# Patient Record
Sex: Female | Born: 1935
Health system: Southern US, Community
[De-identification: ages and names within clinical notes are randomized; demographics above are authoritative.]

## PROBLEM LIST (undated history)

## (undated) DIAGNOSIS — N189 Chronic kidney disease, unspecified: Secondary | ICD-10-CM

## (undated) DIAGNOSIS — I10 Essential (primary) hypertension: Secondary | ICD-10-CM

## (undated) DIAGNOSIS — K208 Other esophagitis without bleeding: Secondary | ICD-10-CM

## (undated) DIAGNOSIS — M549 Dorsalgia, unspecified: Secondary | ICD-10-CM

## (undated) DIAGNOSIS — Z860101 Personal history of adenomatous and serrated colon polyps: Secondary | ICD-10-CM

## (undated) DIAGNOSIS — K222 Esophageal obstruction: Secondary | ICD-10-CM

## (undated) DIAGNOSIS — M1990 Unspecified osteoarthritis, unspecified site: Secondary | ICD-10-CM

## (undated) DIAGNOSIS — E119 Type 2 diabetes mellitus without complications: Secondary | ICD-10-CM

## (undated) DIAGNOSIS — Z9289 Personal history of other medical treatment: Secondary | ICD-10-CM

## (undated) DIAGNOSIS — E041 Nontoxic single thyroid nodule: Secondary | ICD-10-CM

## (undated) DIAGNOSIS — Z972 Presence of dental prosthetic device (complete) (partial): Secondary | ICD-10-CM

## (undated) DIAGNOSIS — Z86010 Personal history of colonic polyps: Secondary | ICD-10-CM

## (undated) DIAGNOSIS — M069 Rheumatoid arthritis, unspecified: Secondary | ICD-10-CM

## (undated) DIAGNOSIS — R1310 Dysphagia, unspecified: Secondary | ICD-10-CM

## (undated) DIAGNOSIS — R0600 Dyspnea, unspecified: Secondary | ICD-10-CM

## (undated) DIAGNOSIS — R42 Dizziness and giddiness: Secondary | ICD-10-CM

## (undated) DIAGNOSIS — K219 Gastro-esophageal reflux disease without esophagitis: Secondary | ICD-10-CM

## (undated) HISTORY — PX: TONSILLECTOMY: SUR1361

## (undated) HISTORY — PX: COLONOSCOPY: SHX174

## (undated) HISTORY — PX: ABDOMINAL HYSTERECTOMY: SHX81

## (undated) HISTORY — PX: CHOLECYSTECTOMY: SHX55

## (undated) SURGERY — EGD (ESOPHAGOGASTRODUODENOSCOPY)
Anesthesia: Monitor Anesthesia Care

---

## 2005-06-20 ENCOUNTER — Emergency Department: Payer: Self-pay | Admitting: Emergency Medicine

## 2006-03-05 ENCOUNTER — Ambulatory Visit: Payer: Self-pay | Admitting: Internal Medicine

## 2007-05-20 ENCOUNTER — Ambulatory Visit: Payer: Self-pay | Admitting: Internal Medicine

## 2007-06-16 ENCOUNTER — Ambulatory Visit: Payer: Self-pay | Admitting: Internal Medicine

## 2007-07-16 ENCOUNTER — Ambulatory Visit: Payer: Self-pay | Admitting: Internal Medicine

## 2007-08-16 ENCOUNTER — Ambulatory Visit: Payer: Self-pay | Admitting: Internal Medicine

## 2007-09-15 ENCOUNTER — Ambulatory Visit: Payer: Self-pay | Admitting: Internal Medicine

## 2008-05-23 ENCOUNTER — Ambulatory Visit: Payer: Self-pay | Admitting: Internal Medicine

## 2009-05-24 ENCOUNTER — Ambulatory Visit: Payer: Self-pay | Admitting: Internal Medicine

## 2011-11-18 ENCOUNTER — Ambulatory Visit: Payer: Self-pay | Admitting: Internal Medicine

## 2012-11-23 ENCOUNTER — Ambulatory Visit: Payer: Self-pay

## 2013-07-06 ENCOUNTER — Ambulatory Visit: Payer: Self-pay | Admitting: Unknown Physician Specialty

## 2013-08-01 ENCOUNTER — Ambulatory Visit: Payer: Self-pay | Admitting: Unknown Physician Specialty

## 2013-09-19 ENCOUNTER — Ambulatory Visit: Payer: Self-pay | Admitting: Unknown Physician Specialty

## 2013-09-21 LAB — PATHOLOGY REPORT

## 2015-01-12 ENCOUNTER — Ambulatory Visit: Payer: Self-pay | Admitting: Unknown Physician Specialty

## 2015-09-11 ENCOUNTER — Other Ambulatory Visit: Payer: Self-pay | Admitting: Nurse Practitioner

## 2015-09-11 DIAGNOSIS — R634 Abnormal weight loss: Secondary | ICD-10-CM

## 2015-09-11 DIAGNOSIS — R1013 Epigastric pain: Secondary | ICD-10-CM

## 2015-09-17 ENCOUNTER — Ambulatory Visit
Admission: RE | Admit: 2015-09-17 | Discharge: 2015-09-17 | Disposition: A | Discharge status: Home / Self Care | Payer: Medicare Other | Source: Ambulatory Visit | Attending: Nurse Practitioner | Admitting: Nurse Practitioner

## 2015-09-17 DIAGNOSIS — M160 Bilateral primary osteoarthritis of hip: Secondary | ICD-10-CM | POA: Insufficient documentation

## 2015-09-17 DIAGNOSIS — M5136 Other intervertebral disc degeneration, lumbar region: Secondary | ICD-10-CM | POA: Insufficient documentation

## 2015-09-17 DIAGNOSIS — R161 Splenomegaly, not elsewhere classified: Secondary | ICD-10-CM | POA: Diagnosis not present

## 2015-09-17 DIAGNOSIS — M47816 Spondylosis without myelopathy or radiculopathy, lumbar region: Secondary | ICD-10-CM | POA: Insufficient documentation

## 2015-09-17 DIAGNOSIS — I2510 Atherosclerotic heart disease of native coronary artery without angina pectoris: Secondary | ICD-10-CM | POA: Diagnosis not present

## 2015-09-17 DIAGNOSIS — R634 Abnormal weight loss: Secondary | ICD-10-CM

## 2015-09-17 DIAGNOSIS — R1013 Epigastric pain: Secondary | ICD-10-CM | POA: Diagnosis present

## 2015-09-17 HISTORY — DX: Essential (primary) hypertension: I10

## 2015-09-17 HISTORY — DX: Type 2 diabetes mellitus without complications: E11.9

## 2015-09-17 MED ORDER — IOHEXOL 300 MG/ML  SOLN
75.0000 mL | Freq: Once | INTRAMUSCULAR | Status: AC | PRN
  Administered 2015-09-17: 75 mL via INTRAVENOUS

## 2015-10-02 ENCOUNTER — Encounter: Payer: Self-pay | Admitting: *Deleted

## 2015-10-03 ENCOUNTER — Encounter: Payer: Self-pay | Admitting: *Deleted

## 2015-10-03 ENCOUNTER — Ambulatory Visit
Admission: RE | Admit: 2015-10-03 | Discharge: 2015-10-03 | Disposition: A | Discharge status: Home / Self Care | Payer: Medicare Other | Source: Ambulatory Visit | Attending: Unknown Physician Specialty | Admitting: Unknown Physician Specialty

## 2015-10-03 ENCOUNTER — Ambulatory Visit: Payer: Medicare Other | Admitting: Anesthesiology

## 2015-10-03 ENCOUNTER — Encounter
Admission: RE | Disposition: A | Discharge status: Home / Self Care | Payer: Self-pay | Source: Ambulatory Visit | Attending: Unknown Physician Specialty

## 2015-10-03 DIAGNOSIS — E119 Type 2 diabetes mellitus without complications: Secondary | ICD-10-CM | POA: Diagnosis not present

## 2015-10-03 DIAGNOSIS — K210 Gastro-esophageal reflux disease with esophagitis: Secondary | ICD-10-CM | POA: Diagnosis not present

## 2015-10-03 DIAGNOSIS — Z79899 Other long term (current) drug therapy: Secondary | ICD-10-CM | POA: Diagnosis not present

## 2015-10-03 DIAGNOSIS — M1990 Unspecified osteoarthritis, unspecified site: Secondary | ICD-10-CM | POA: Diagnosis not present

## 2015-10-03 DIAGNOSIS — Z7984 Long term (current) use of oral hypoglycemic drugs: Secondary | ICD-10-CM | POA: Diagnosis not present

## 2015-10-03 DIAGNOSIS — R1310 Dysphagia, unspecified: Secondary | ICD-10-CM | POA: Insufficient documentation

## 2015-10-03 DIAGNOSIS — Z7982 Long term (current) use of aspirin: Secondary | ICD-10-CM | POA: Insufficient documentation

## 2015-10-03 DIAGNOSIS — I10 Essential (primary) hypertension: Secondary | ICD-10-CM | POA: Insufficient documentation

## 2015-10-03 DIAGNOSIS — K222 Esophageal obstruction: Secondary | ICD-10-CM | POA: Insufficient documentation

## 2015-10-03 DIAGNOSIS — R1013 Epigastric pain: Secondary | ICD-10-CM | POA: Diagnosis not present

## 2015-10-03 DIAGNOSIS — M069 Rheumatoid arthritis, unspecified: Secondary | ICD-10-CM | POA: Diagnosis not present

## 2015-10-03 HISTORY — PX: ESOPHAGOGASTRODUODENOSCOPY (EGD) WITH PROPOFOL: SHX5813

## 2015-10-03 HISTORY — DX: Nontoxic single thyroid nodule: E04.1

## 2015-10-03 HISTORY — DX: Rheumatoid arthritis, unspecified: M06.9

## 2015-10-03 HISTORY — DX: Gastro-esophageal reflux disease without esophagitis: K21.9

## 2015-10-03 HISTORY — DX: Esophageal obstruction: K22.2

## 2015-10-03 HISTORY — PX: SAVORY DILATION: SHX5439

## 2015-10-03 HISTORY — DX: Dysphagia, unspecified: R13.10

## 2015-10-03 HISTORY — DX: Unspecified osteoarthritis, unspecified site: M19.90

## 2015-10-03 LAB — GLUCOSE, CAPILLARY: GLUCOSE-CAPILLARY: 97 mg/dL (ref 65–99)

## 2015-10-03 SURGERY — ESOPHAGOGASTRODUODENOSCOPY (EGD) WITH PROPOFOL
Anesthesia: General

## 2015-10-03 MED ORDER — PROPOFOL 500 MG/50ML IV EMUL
INTRAVENOUS | Status: DC | PRN
  Administered 2015-10-03: 75 ug/kg/min via INTRAVENOUS

## 2015-10-03 MED ORDER — SODIUM CHLORIDE 0.9 % IV SOLN
INTRAVENOUS | Status: DC
  Administered 2015-10-03: 1000 mL via INTRAVENOUS
  Administered 2015-10-03: 11:00:00 via INTRAVENOUS

## 2015-10-03 MED ORDER — ESMOLOL HCL 100 MG/10ML IV SOLN: INTRAVENOUS | Status: DC | PRN

## 2015-10-03 MED ORDER — ESMOLOL HCL 100 MG/10ML IV SOLN
INTRAVENOUS | Status: DC | PRN
  Administered 2015-10-03: 20 mg via INTRAVENOUS
  Administered 2015-10-03: 30 mg via INTRAVENOUS
  Administered 2015-10-03: 20 mg via INTRAVENOUS
  Administered 2015-10-03: 30 mg via INTRAVENOUS

## 2015-10-03 MED ORDER — FENTANYL CITRATE (PF) 100 MCG/2ML IJ SOLN
INTRAMUSCULAR | Status: DC | PRN
Start: 2015-10-03 — End: 2015-10-03
  Administered 2015-10-03 (×2): 25 ug via INTRAVENOUS

## 2015-10-03 MED ORDER — LABETALOL HCL 5 MG/ML IV SOLN
INTRAVENOUS | Status: DC | PRN
  Administered 2015-10-03 (×2): 5 mg via INTRAVENOUS

## 2015-10-03 MED ORDER — PROPOFOL 10 MG/ML IV BOLUS
INTRAVENOUS | Status: DC | PRN
  Administered 2015-10-03: 20 mg via INTRAVENOUS
  Administered 2015-10-03: 30 mg via INTRAVENOUS

## 2015-10-03 MED ORDER — LIDOCAINE HCL (PF) 2 % IJ SOLN
INTRAMUSCULAR | Status: DC | PRN
Start: 2015-10-03 — End: 2015-10-03
  Administered 2015-10-03: 40 mg

## 2015-10-03 NOTE — Transfer of Care (Signed)
Immediate Anesthesia Transfer of Care Note  Patient: Lisa Hernandez  Procedure(s) Performed: Procedure(s): ESOPHAGOGASTRODUODENOSCOPY (EGD) WITH PROPOFOL (N/A) SAVORY DILATION (N/A)  Patient Location: PACU  Anesthesia Type:General  Level of Consciousness: sedated  Airway & Oxygen Therapy: Patient Spontanous Breathing and Patient connected to nasal cannula oxygen  Post-op Assessment: Report given to RN and Post -op Vital signs reviewed and stable  Post vital signs: Reviewed and stable  Last Vitals:  Filed Vitals:   10/03/15 0831  BP: 108/62  Temp: 36.8 C  Resp: 16    Complications: No apparent anesthesia complications

## 2015-10-03 NOTE — Op Note (Signed)
[facility] Gastroenterology Patient Name: Lisa Hernandez Procedure Date: 10/03/2015 10:48 AM MRN: [id number] Account #: [id number] Date of Birth: January 19, 1936 Admit Type: Outpatient Age: 79 Room: [facility] ENDO ROOM 4 Gender: Female Note Status: Finalized Procedure:         Upper GI endoscopy Indications:       Dysphagia Providers:         Manya Silvas, MD Referring MD:      Youlanda Roys. Ola Spurr, MD (Referring MD) Medicines:         Propofol per Anesthesia Complications:     No immediate complications. Procedure:         Pre-Anesthesia Assessment:                    - After reviewing the risks and benefits, the patient was                     deemed in satisfactory condition to undergo the procedure.                    After obtaining informed consent, the endoscope was passed                     under direct vision. Throughout the procedure, the                     patient's blood pressure, pulse, and oxygen saturations                     were monitored continuously. The Endoscope was introduced                     through the mouth, and advanced to the second part of                     duodenum. The upper GI endoscopy was accomplished without                     difficulty. The patient tolerated the procedure well. Findings:      A benign-appearing, intrinsic severe (stenosis; an endoscope cannot       pass) stenosis measuring 1 cm (in length) was found 38 cm from the       incisors and was traversed after dilation. There was streaks of       esophagitis A guidewire was placed and the scope was withdrawn. Dilation       was performed with a Savary dilator with mild resistance at 12.8 mm, 14       mm and 15 mm.      The stomach was normal.      The examined duodenum was normal. Impression:        - Benign-appearing esophageal stricture. Dilated.                    - Normal stomach.                    - Normal examined duodenum.                    - No specimens  collected. Recommendation:    - Repeat the upper endoscopy in 4 weeks for retreatment. Manya Silvas, MD 10/03/2015 11:11:12 AM This report has been signed electronically. Number of Addenda: 0 Note Initiated On: 10/03/2015 10:48 AM  [facility]

## 2015-10-03 NOTE — H&P (Signed)
   Primary Care Physician:  [name], MD Primary Gastroenterologist:  Dr. [name]  Pre-Procedure History & Physical: HPI:  Lisa Hernandez is a 79 y.o. female is here for an endoscopy.   Past Medical History  Diagnosis Date  . Diabetes mellitus without complication ([place])   . Hypertension   . GERD (gastroesophageal reflux disease)   . Esophageal stricture   . Osteoarthritis     knees  . Rheumatoid arthritis ([place])   . Thyroid nodule   . Dysphagia     Past Surgical History  Procedure Laterality Date  . Abdominal hysterectomy    . Cholecystectomy    . Tonsillectomy      Prior to Admission medications   Medication Sig Start Date End Date Taking? Authorizing Provider  aspirin EC 81 MG tablet Take 81 mg by mouth daily.   Yes Historical Provider, MD  lansoprazole (PREVACID SOLUTAB) 30 MG disintegrating tablet Take 30 mg by mouth 2 (two) times daily.    Yes Historical Provider, MD  lisinopril (PRINIVIL,ZESTRIL) 10 MG tablet Take 10 mg by mouth daily. Take 2 tabs daily   Yes Historical Provider, MD  meclizine (ANTIVERT) 12.5 MG tablet Take 12.5 mg by mouth 3 (three) times daily as needed for dizziness.   Yes Historical Provider, MD  metFORMIN (GLUCOPHAGE) 500 MG tablet Take 500 mg by mouth. Take 2 tabs in am, 1 tab in pm   Yes Historical Provider, MD  methotrexate (RHEUMATREX) 2.5 MG tablet Take 7.5 mg by mouth once a week.   Yes Historical Provider, MD  Jonetta Speak LANCETS 37C MISC 3 daily   Yes Historical Provider, MD    Allergies as of 09/13/2015  . (Not on File)    History reviewed. No pertinent family history.  Social History   Social History  . Marital Status: Divorced    Spouse Name: N/A  . Number of Children: N/A  . Years of Education: N/A   Occupational History  . Not on file.   Social History Main Topics  . Smoking status: Never Smoker   . Smokeless tobacco: Never Used  . Alcohol Use: No  . Drug Use: No  . Sexual Activity: Not on file   Other  Topics Concern  . Not on file   Social History Narrative    Review of Systems: See HPI, otherwise negative ROS  Physical Exam: BP 108/62 mmHg  Temp(Src) 98.3 F (36.8 C) (Oral)  Resp 16  Ht 5\' 1"  (1.549 m)  Wt 53.071 kg (117 lb)  BMI 22.12 kg/m2  SpO2 100% General:   Alert,  pleasant and cooperative in NAD Head:  Normocephalic and atraumatic. Neck:  Supple; no masses or thyromegaly. Lungs:  Clear throughout to auscultation.    Heart:  Regular rate and rhythm. Abdomen:  Soft, nontender and nondistended. Normal bowel sounds, without guarding, and without rebound.   Neurologic:  Alert and  oriented x4;  grossly normal neurologically.  Impression/Plan: Lisa Hernandez is here for an endoscopy to be performed for dysphagia and epigastric pain  Risks, benefits, limitations, and alternatives regarding  endoscopy have been reviewed with the patient.  Questions have been answered.  All parties agreeable.   Gaylyn Cheers, MD  10/03/2015, 10:46 AM

## 2015-10-03 NOTE — Anesthesia Postprocedure Evaluation (Signed)
  Anesthesia Post-op Note  Patient: Lisa Hernandez  Procedure(s) Performed: Procedure(s): ESOPHAGOGASTRODUODENOSCOPY (EGD) WITH PROPOFOL (N/A) SAVORY DILATION (N/A)  Anesthesia type:General  Patient location: PACU  Post pain: Pain level controlled  Post assessment: Post-op Vital signs reviewed, Patient's Cardiovascular Status Stable, Respiratory Function Stable, Patent Airway and No signs of Nausea or vomiting  Post vital signs: Reviewed and stable  Last Vitals:  Filed Vitals:   10/03/15 1145  BP: 138/70  Pulse: 82  Temp:   Resp: 20    Level of consciousness: awake, alert  and patient cooperative  Complications: No apparent anesthesia complications

## 2015-10-03 NOTE — Anesthesia Preprocedure Evaluation (Addendum)
Anesthesia Evaluation  Patient identified by MRN, date of birth, ID band Patient awake    Reviewed: Allergy & Precautions, NPO status , Patient's Chart, lab work & pertinent test results, reviewed documented beta blocker date and time   Airway Mallampati: II  TM Distance: >3 FB     Dental  (+) Lower Dentures, Upper Dentures   Pulmonary           Cardiovascular hypertension, Pt. on medications      Neuro/Psych    GI/Hepatic GERD  ,  Endo/Other  diabetes  Renal/GU      Musculoskeletal  (+) Arthritis ,   Abdominal   Peds  Hematology   Anesthesia Other Findings   Reproductive/Obstetrics                            Anesthesia Physical Anesthesia Plan  ASA: II  Anesthesia Plan: General   Post-op Pain Management:    Induction:   Airway Management Planned: Nasal Cannula  Additional Equipment:   Intra-op Plan:   Post-operative Plan:   Informed Consent: I have reviewed the patients History and Physical, chart, labs and discussed the procedure including the risks, benefits and alternatives for the proposed anesthesia with the patient or authorized representative who has indicated his/her understanding and acceptance.     Plan Discussed with: CRNA  Anesthesia Plan Comments:         Anesthesia Quick Evaluation

## 2015-10-04 ENCOUNTER — Encounter: Payer: Self-pay | Admitting: Unknown Physician Specialty

## 2015-10-25 ENCOUNTER — Ambulatory Visit
Admission: RE | Admit: 2015-10-25 | Discharge: 2015-10-25 | Disposition: A | Discharge status: Home / Self Care | Payer: Medicare Other | Source: Ambulatory Visit | Attending: Unknown Physician Specialty | Admitting: Unknown Physician Specialty

## 2015-10-25 ENCOUNTER — Ambulatory Visit: Payer: Medicare Other | Admitting: Anesthesiology

## 2015-10-25 ENCOUNTER — Encounter: Payer: Self-pay | Admitting: Anesthesiology

## 2015-10-25 ENCOUNTER — Encounter
Admission: RE | Disposition: A | Discharge status: Home / Self Care | Payer: Self-pay | Source: Ambulatory Visit | Attending: Unknown Physician Specialty

## 2015-10-25 DIAGNOSIS — Z9049 Acquired absence of other specified parts of digestive tract: Secondary | ICD-10-CM | POA: Diagnosis not present

## 2015-10-25 DIAGNOSIS — E119 Type 2 diabetes mellitus without complications: Secondary | ICD-10-CM | POA: Insufficient documentation

## 2015-10-25 DIAGNOSIS — R1310 Dysphagia, unspecified: Secondary | ICD-10-CM | POA: Diagnosis present

## 2015-10-25 DIAGNOSIS — Z90710 Acquired absence of both cervix and uterus: Secondary | ICD-10-CM | POA: Diagnosis not present

## 2015-10-25 DIAGNOSIS — I10 Essential (primary) hypertension: Secondary | ICD-10-CM | POA: Diagnosis not present

## 2015-10-25 DIAGNOSIS — Z7984 Long term (current) use of oral hypoglycemic drugs: Secondary | ICD-10-CM | POA: Diagnosis not present

## 2015-10-25 DIAGNOSIS — Z881 Allergy status to other antibiotic agents status: Secondary | ICD-10-CM | POA: Diagnosis not present

## 2015-10-25 DIAGNOSIS — K219 Gastro-esophageal reflux disease without esophagitis: Secondary | ICD-10-CM | POA: Insufficient documentation

## 2015-10-25 DIAGNOSIS — M19071 Primary osteoarthritis, right ankle and foot: Secondary | ICD-10-CM | POA: Diagnosis not present

## 2015-10-25 DIAGNOSIS — Z79899 Other long term (current) drug therapy: Secondary | ICD-10-CM | POA: Insufficient documentation

## 2015-10-25 DIAGNOSIS — M069 Rheumatoid arthritis, unspecified: Secondary | ICD-10-CM | POA: Diagnosis not present

## 2015-10-25 DIAGNOSIS — Z7982 Long term (current) use of aspirin: Secondary | ICD-10-CM | POA: Diagnosis not present

## 2015-10-25 DIAGNOSIS — K222 Esophageal obstruction: Secondary | ICD-10-CM | POA: Insufficient documentation

## 2015-10-25 DIAGNOSIS — M19072 Primary osteoarthritis, left ankle and foot: Secondary | ICD-10-CM | POA: Insufficient documentation

## 2015-10-25 DIAGNOSIS — E041 Nontoxic single thyroid nodule: Secondary | ICD-10-CM | POA: Insufficient documentation

## 2015-10-25 HISTORY — PX: SAVORY DILATION: SHX5439

## 2015-10-25 HISTORY — PX: ESOPHAGOGASTRODUODENOSCOPY (EGD) WITH PROPOFOL: SHX5813

## 2015-10-25 LAB — GLUCOSE, CAPILLARY: Glucose-Capillary: 86 mg/dL (ref 65–99)

## 2015-10-25 SURGERY — ESOPHAGOGASTRODUODENOSCOPY (EGD) WITH PROPOFOL
Anesthesia: General

## 2015-10-25 MED ORDER — MIDAZOLAM HCL 2 MG/2ML IJ SOLN
INTRAMUSCULAR | Status: DC | PRN
  Administered 2015-10-25: 1 mg via INTRAVENOUS

## 2015-10-25 MED ORDER — SODIUM CHLORIDE 0.9 % IV SOLN: INTRAVENOUS | Status: DC

## 2015-10-25 MED ORDER — PROPOFOL 500 MG/50ML IV EMUL
INTRAVENOUS | Status: DC | PRN
  Administered 2015-10-25: 120 ug/kg/min via INTRAVENOUS

## 2015-10-25 MED ORDER — SODIUM CHLORIDE 0.9 % IV SOLN
INTRAVENOUS | Status: DC
  Administered 2015-10-25: 1000 mL via INTRAVENOUS

## 2015-10-25 MED ORDER — GLYCOPYRROLATE 0.2 MG/ML IJ SOLN
INTRAMUSCULAR | Status: DC | PRN
  Administered 2015-10-25: 0.1 mg via INTRAVENOUS

## 2015-10-25 MED ORDER — LIDOCAINE HCL (CARDIAC) 20 MG/ML IV SOLN
INTRAVENOUS | Status: DC | PRN
  Administered 2015-10-25: 40 mg via INTRAVENOUS

## 2015-10-25 MED ORDER — FENTANYL CITRATE (PF) 100 MCG/2ML IJ SOLN
INTRAMUSCULAR | Status: DC | PRN
  Administered 2015-10-25: 50 ug via INTRAVENOUS

## 2015-10-25 NOTE — Anesthesia Postprocedure Evaluation (Signed)
  Anesthesia Post-op Note  Patient: Lisa Hernandez  Procedure(s) Performed: Procedure(s) with comments: ESOPHAGOGASTRODUODENOSCOPY (EGD) WITH PROPOFOL (N/A) -  IDDM SAVORY DILATION (N/A)  Anesthesia type:General  Patient location: PACU  Post pain: Pain level controlled  Post assessment: Post-op Vital signs reviewed, Patient's Cardiovascular Status Stable, Respiratory Function Stable, Patent Airway and No signs of Nausea or vomiting  Post vital signs: Reviewed and stable  Last Vitals:  Filed Vitals:   10/25/15 1141  BP: 169/96  Pulse: 107  Temp:   Resp: 19    Level of consciousness: awake, alert  and patient cooperative  Complications: No apparent anesthesia complications

## 2015-10-25 NOTE — Transfer of Care (Signed)
Immediate Anesthesia Transfer of Care Note  Patient: Lisa Hernandez  Procedure(s) Performed: Procedure(s) with comments: ESOPHAGOGASTRODUODENOSCOPY (EGD) WITH PROPOFOL (N/A) -  IDDM SAVORY DILATION (N/A)  Patient Location: PACU  Anesthesia Type:General  Level of Consciousness: awake and sedated  Airway & Oxygen Therapy: Patient Spontanous Breathing and Patient connected to nasal cannula oxygen  Post-op Assessment: Report given to RN and Post -op Vital signs reviewed and stable  Post vital signs: Reviewed and stable  Last Vitals:  Filed Vitals:   10/25/15 1110  BP: 126/64  Pulse: 110  Temp:   Resp: 18    Complications: No apparent anesthesia complications

## 2015-10-25 NOTE — Anesthesia Procedure Notes (Signed)
Performed by: Vaughan Sine Pre-anesthesia Checklist: Patient identified, Emergency Drugs available, Suction available, Timeout performed and Patient being monitored Patient Re-evaluated:Patient Re-evaluated prior to inductionOxygen Delivery Method: Nasal cannula Preoxygenation: Pre-oxygenation with 100% oxygen Intubation Type: IV induction Ventilation: Nasal airway inserted- appropriate to patient size Airway Equipment and Method: Bite block Placement Confirmation: positive ETCO2 and CO2 detector

## 2015-10-25 NOTE — Anesthesia Preprocedure Evaluation (Addendum)
Anesthesia Evaluation  Patient identified by MRN, date of birth, ID band Patient awake    Reviewed: Allergy & Precautions, NPO status , Patient's Chart, lab work & pertinent test results, reviewed documented beta blocker date and time   Airway Mallampati: II  TM Distance: >3 FB     Dental  (+) Lower Dentures, Upper Dentures   Pulmonary neg pulmonary ROS,    Pulmonary exam normal breath sounds clear to auscultation       Cardiovascular hypertension, Pt. on medications Normal cardiovascular exam     Neuro/Psych negative neurological ROS  negative psych ROS   GI/Hepatic Neg liver ROS, GERD  Medicated and Controlled,  Endo/Other  diabetes, Well Controlled, Type 2, Oral Hypoglycemic Agents  Renal/GU negative Renal ROS  negative genitourinary   Musculoskeletal  (+) Arthritis , Osteoarthritis,    Abdominal Normal abdominal exam  (+)   Peds negative pediatric ROS (+)  Hematology negative hematology ROS (+)   Anesthesia Other Findings   Reproductive/Obstetrics                            Anesthesia Physical  Anesthesia Plan  ASA: II  Anesthesia Plan: General   Post-op Pain Management:    Induction:   Airway Management Planned: Nasal Cannula  Additional Equipment:   Intra-op Plan:   Post-operative Plan:   Informed Consent: I have reviewed the patients History and Physical, chart, labs and discussed the procedure including the risks, benefits and alternatives for the proposed anesthesia with the patient or authorized representative who has indicated his/her understanding and acceptance.     Plan Discussed with: CRNA  Anesthesia Plan Comments:         Anesthesia Quick Evaluation

## 2015-10-25 NOTE — H&P (Signed)
Primary Care Physician:  [name], MD Primary Gastroenterologist:  Dr. [name]  Pre-Procedure History & Physical: HPI:  Lisa Hernandez is a 79 y.o. female is here for an endoscopy.   Past Medical History  Diagnosis Date  . Diabetes mellitus without complication ([place])   . Hypertension   . GERD (gastroesophageal reflux disease)   . Esophageal stricture   . Osteoarthritis     knees  . Rheumatoid arthritis ([place])   . Thyroid nodule   . Dysphagia     Past Surgical History  Procedure Laterality Date  . Abdominal hysterectomy    . Cholecystectomy    . Tonsillectomy    . Esophagogastroduodenoscopy (egd) with propofol N/A 10/03/2015    Procedure: ESOPHAGOGASTRODUODENOSCOPY (EGD) WITH PROPOFOL;  Surgeon: Manya Silvas, MD;  Location: [place];  Service: Endoscopy;  Laterality: N/A;  . Savory dilation N/A 10/03/2015    Procedure: SAVORY DILATION;  Surgeon: Manya Silvas, MD;  Location: [facility] ENDOSCOPY;  Service: Endoscopy;  Laterality: N/A;    Prior to Admission medications   Medication Sig Start Date End Date Taking? Authorizing Provider  aspirin EC 81 MG tablet Take 81 mg by mouth daily.   Yes Historical Provider, MD  lansoprazole (PREVACID SOLUTAB) 30 MG disintegrating tablet Take 30 mg by mouth 2 (two) times daily.    Yes Historical Provider, MD  lisinopril (PRINIVIL,ZESTRIL) 10 MG tablet Take 10 mg by mouth daily. Take 2 tabs daily   Yes Historical Provider, MD  meclizine (ANTIVERT) 12.5 MG tablet Take 12.5 mg by mouth 3 (three) times daily as needed for dizziness.   Yes Historical Provider, MD  metFORMIN (GLUCOPHAGE) 500 MG tablet Take 500 mg by mouth. Take 2 tabs in am, 1 tab in pm   Yes Historical Provider, MD  methotrexate (RHEUMATREX) 2.5 MG tablet Take 7.5 mg by mouth once a week.   Yes Historical Provider, MD  Jonetta Speak LANCETS [id number] [place] 3 daily   Yes Historical Provider, MD    Allergies as of 10/05/2015 - Review Complete 10/03/2015  Allergen  Reaction Noted  . Erythromycin ethylsuccinate [erythromycin] Other (See Comments) 10/02/2015    History reviewed. No pertinent family history.  Social History   Social History  . Marital Status: Divorced    Spouse Name: N/A  . Number of Children: N/A  . Years of Education: N/A   Occupational History  . Not on file.   Social History Main Topics  . Smoking status: Never Smoker   . Smokeless tobacco: Never Used  . Alcohol Use: No  . Drug Use: No  . Sexual Activity: Not on file   Other Topics Concern  . Not on file   Social History Narrative    Review of Systems: See HPI, otherwise negative ROS  Physical Exam: BP 134/64 mmHg  Pulse 78  Temp(Src) 97.5 F (36.4 C) (Tympanic)  Resp 16  Ht 5\' 1"  (1.549 m)  Wt 53.978 kg (119 lb)  BMI 22.50 kg/m2  SpO2 100% General:   Alert,  pleasant and cooperative in NAD Head:  Normocephalic and atraumatic. Neck:  Supple; no masses or thyromegaly. Lungs:  Clear throughout to auscultation.    Heart:  Regular rate and rhythm. Abdomen:  Soft, nontender and nondistended. Normal bowel sounds, without guarding, and without rebound.   Neurologic:  Alert and  oriented x4;  grossly normal neurologically.  Impression/Plan: [name] is here for an endoscopy to be performed for dysphagia, previous strictures  Risks, benefits, limitations, and  alternatives regarding  endoscopy have been reviewed with the patient.  Questions have been answered.  All parties agreeable.   Gaylyn Cheers, MD  10/25/2015, 10:48 AM

## 2015-10-25 NOTE — Op Note (Signed)
[facility] Gastroenterology Patient Name: Lisa Hernandez Procedure Date: 10/25/2015 10:47 AM MRN: [id number] Account #: [id number] Date of Birth: 07-30-1936 Admit Type: Outpatient Age: 79 Room: [facility] ENDO ROOM 1 Gender: Female Note Status: Finalized Procedure:         Upper GI endoscopy Indications:       Dysphagia Providers:         Manya Silvas, MD Medicines:         Propofol per Anesthesia Complications:     No immediate complications. Procedure:         Pre-Anesthesia Assessment:                    - After reviewing the risks and benefits, the patient was                     deemed in satisfactory condition to undergo the procedure.                    After obtaining informed consent, the endoscope was passed                     under direct vision. Throughout the procedure, the                     patient's blood pressure, pulse, and oxygen saturations                     were monitored continuously. The Olympus GIF-140 endoscope                     (S#: [id number]) was introduced through the mouth, and                     advanced to the second part of duodenum. The upper GI                     endoscopy was accomplished without difficulty. The patient                     tolerated the procedure well. Findings:      One severe benign-appearing, intrinsic stenosis was found. The stricture       was seen and was not traversed. A guidewire was placed and the scope was       withdrawn. Dilation was performed with a Savary dilator with mild       resistance at 15 mm and 16 mm. Impression:        - Benign-appearing esophageal stenosis. Dilated.                    - No specimens collected. Recommendation:    - soft food for 3 days, eat slowly, chew well, take small                     bites Manya Silvas, MD 10/25/2015 11:09:00 AM This report has been signed electronically. Number of Addenda: 0 Note Initiated On: 10/25/2015 10:47 AM      [facility]

## 2015-10-29 ENCOUNTER — Encounter: Payer: Self-pay | Admitting: Unknown Physician Specialty

## 2016-08-25 HISTORY — PX: OTHER SURGICAL HISTORY: SHX169

## 2016-10-24 ENCOUNTER — Encounter: Payer: Self-pay | Admitting: *Deleted

## 2016-10-27 ENCOUNTER — Encounter
Admission: RE | Disposition: A | Discharge status: Home / Self Care | Payer: Self-pay | Source: Ambulatory Visit | Attending: Unknown Physician Specialty

## 2016-10-27 ENCOUNTER — Ambulatory Visit: Payer: Medicare Other | Admitting: Anesthesiology

## 2016-10-27 ENCOUNTER — Ambulatory Visit
Admission: RE | Admit: 2016-10-27 | Discharge: 2016-10-27 | Disposition: A | Discharge status: Home / Self Care | Payer: Medicare Other | Source: Ambulatory Visit | Attending: Unknown Physician Specialty | Admitting: Unknown Physician Specialty

## 2016-10-27 ENCOUNTER — Encounter: Payer: Self-pay | Admitting: Anesthesiology

## 2016-10-27 DIAGNOSIS — K222 Esophageal obstruction: Secondary | ICD-10-CM | POA: Diagnosis not present

## 2016-10-27 DIAGNOSIS — E119 Type 2 diabetes mellitus without complications: Secondary | ICD-10-CM | POA: Diagnosis not present

## 2016-10-27 DIAGNOSIS — K219 Gastro-esophageal reflux disease without esophagitis: Secondary | ICD-10-CM | POA: Insufficient documentation

## 2016-10-27 DIAGNOSIS — M069 Rheumatoid arthritis, unspecified: Secondary | ICD-10-CM | POA: Diagnosis not present

## 2016-10-27 DIAGNOSIS — I10 Essential (primary) hypertension: Secondary | ICD-10-CM | POA: Insufficient documentation

## 2016-10-27 DIAGNOSIS — Z7982 Long term (current) use of aspirin: Secondary | ICD-10-CM | POA: Insufficient documentation

## 2016-10-27 DIAGNOSIS — R1310 Dysphagia, unspecified: Secondary | ICD-10-CM | POA: Diagnosis present

## 2016-10-27 DIAGNOSIS — Z79899 Other long term (current) drug therapy: Secondary | ICD-10-CM | POA: Insufficient documentation

## 2016-10-27 DIAGNOSIS — Z7984 Long term (current) use of oral hypoglycemic drugs: Secondary | ICD-10-CM | POA: Insufficient documentation

## 2016-10-27 HISTORY — PX: ESOPHAGOGASTRODUODENOSCOPY (EGD) WITH PROPOFOL: SHX5813

## 2016-10-27 LAB — GLUCOSE, CAPILLARY: GLUCOSE-CAPILLARY: 110 mg/dL — AB (ref 65–99)

## 2016-10-27 SURGERY — ESOPHAGOGASTRODUODENOSCOPY (EGD) WITH PROPOFOL
Anesthesia: General

## 2016-10-27 MED ORDER — SODIUM CHLORIDE 0.9 % IV SOLN: INTRAVENOUS | Status: DC

## 2016-10-27 MED ORDER — PROPOFOL 10 MG/ML IV BOLUS
INTRAVENOUS | Status: DC | PRN
  Administered 2016-10-27: 30 mg via INTRAVENOUS
  Administered 2016-10-27 (×2): 10 mg via INTRAVENOUS

## 2016-10-27 MED ORDER — FENTANYL CITRATE (PF) 100 MCG/2ML IJ SOLN
INTRAMUSCULAR | Status: DC | PRN
  Administered 2016-10-27 (×2): 25 ug via INTRAVENOUS

## 2016-10-27 MED ORDER — LIDOCAINE HCL (PF) 2 % IJ SOLN
INTRAMUSCULAR | Status: DC | PRN
  Administered 2016-10-27: 50 mg

## 2016-10-27 MED ORDER — SODIUM CHLORIDE 0.9 % IV SOLN
INTRAVENOUS | Status: DC
  Administered 2016-10-27: 09:00:00 via INTRAVENOUS

## 2016-10-27 MED ORDER — PROPOFOL 500 MG/50ML IV EMUL
INTRAVENOUS | Status: DC | PRN
  Administered 2016-10-27: 50 ug/kg/min via INTRAVENOUS

## 2016-10-27 MED ORDER — ESMOLOL HCL 100 MG/10ML IV SOLN
INTRAVENOUS | Status: DC | PRN
  Administered 2016-10-27 (×2): 30 mg via INTRAVENOUS
  Administered 2016-10-27 (×2): 20 mg via INTRAVENOUS

## 2016-10-27 NOTE — Transfer of Care (Signed)
Immediate Anesthesia Transfer of Care Note  Patient: Lisa Hernandez  Procedure(s) Performed: Procedure(s) with comments: ESOPHAGOGASTRODUODENOSCOPY (EGD) WITH PROPOFOL (N/A) - Diabetic  Patient Location: PACU  Anesthesia Type:General  Level of Consciousness: sedated  Airway & Oxygen Therapy: Patient Spontanous Breathing and Patient connected to nasal cannula oxygen  Post-op Assessment: Report given to RN and Post -op Vital signs reviewed and stable  Post vital signs: Reviewed and stable  Last Vitals:  Vitals:   10/27/16 0812 10/27/16 0900  BP: (!) 153/71 (P) 126/76  Pulse: 67 (P) 95  Resp: 20 (P) 20  Temp: (!) 36.1 C (!) 36.1 C    Last Pain:  Vitals:   10/27/16 0900  TempSrc: (P) Tympanic         Complications: No apparent anesthesia complications

## 2016-10-27 NOTE — H&P (Signed)
Primary Care Physician:  Leonel Ramsay, MD Primary Gastroenterologist:  Dr. [name]  Pre-Procedure History & Physical: HPI:  Lisa Hernandez is a 80 y.o. female is here for an endoscopy.   Past Medical History:  Diagnosis Date  . Diabetes mellitus without complication ([place])   . Dysphagia   . Esophageal stricture   . GERD (gastroesophageal reflux disease)   . Hypertension   . Osteoarthritis    knees  . Rheumatoid arthritis ([place])   . Thyroid nodule     Past Surgical History:  Procedure Laterality Date  . ABDOMINAL HYSTERECTOMY    . CHOLECYSTECTOMY    . ESOPHAGOGASTRODUODENOSCOPY (EGD) WITH PROPOFOL N/A 10/03/2015   Procedure: ESOPHAGOGASTRODUODENOSCOPY (EGD) WITH PROPOFOL;  Surgeon: Manya Silvas, MD;  Location: [facility] ENDOSCOPY;  Service: Endoscopy;  Laterality: N/A;  . ESOPHAGOGASTRODUODENOSCOPY (EGD) WITH PROPOFOL N/A 10/25/2015   Procedure: ESOPHAGOGASTRODUODENOSCOPY (EGD) WITH PROPOFOL;  Surgeon: Manya Silvas, MD;  Location: [facility] ENDOSCOPY;  Service: Endoscopy;  Laterality: N/A;   IDDM  . SAVORY DILATION N/A 10/03/2015   Procedure: SAVORY DILATION;  Surgeon: Manya Silvas, MD;  Location: [facility] ENDOSCOPY;  Service: Endoscopy;  Laterality: N/A;  . SAVORY DILATION N/A 10/25/2015   Procedure: SAVORY DILATION;  Surgeon: Manya Silvas, MD;  Location: [facility] ENDOSCOPY;  Service: Endoscopy;  Laterality: N/A;  . TONSILLECTOMY      Prior to Admission medications   Medication Sig Start Date End Date Taking? Authorizing Provider  lisinopril (PRINIVIL,ZESTRIL) 10 MG tablet Take 10 mg by mouth daily. Take 2 tabs daily   Yes Historical Provider, MD  pantoprazole (PROTONIX) 20 MG tablet Take 20 mg by mouth daily.   Yes Historical Provider, MD  aspirin EC 81 MG tablet Take 81 mg by mouth daily.    Historical Provider, MD  lansoprazole (PREVACID SOLUTAB) 30 MG disintegrating tablet Take 30 mg by mouth 2 (two) times daily.     Historical Provider, MD  meclizine (ANTIVERT) 12.5  MG tablet Take 12.5 mg by mouth 3 (three) times daily as needed for dizziness.    Historical Provider, MD  metFORMIN (GLUCOPHAGE) 500 MG tablet Take 500 mg by mouth. Take 2 tabs in am, 1 tab in pm    Historical Provider, MD  methotrexate (RHEUMATREX) 2.5 MG tablet Take 7.5 mg by mouth once a week.    Historical Provider, MD  Jonetta Speak LANCETS [id number] [place] 3 daily    Historical Provider, MD    Allergies as of 09/01/2016 - Review Complete 10/25/2015  Allergen Reaction Noted  . Erythromycin ethylsuccinate [erythromycin] Other (See Comments) 10/02/2015    History reviewed. No pertinent family history.  Social History   Social History  . Marital status: Divorced    Spouse name: N/A  . Number of children: N/A  . Years of education: N/A   Occupational History  . Not on file.   Social History Main Topics  . Smoking status: Never Smoker  . Smokeless tobacco: Never Used  . Alcohol use No  . Drug use: No  . Sexual activity: Not on file   Other Topics Concern  . Not on file   Social History Narrative  . No narrative on file    Review of Systems: See HPI, otherwise negative ROS  Physical Exam: BP (!) 153/71   Pulse 67   Temp (!) 96.9 F (36.1 C) (Tympanic)   Resp 20   Ht 5\' 1"  (1.549 m)   Wt 55.8 kg (123 lb)   SpO2 100%  BMI 23.24 kg/m  General:   Alert,  pleasant and cooperative in NAD Head:  Normocephalic and atraumatic. Neck:  Supple; no masses or thyromegaly. Lungs:  Clear throughout to auscultation.    Heart:  Regular rate and rhythm. Abdomen:  Soft, nontender and nondistended. Normal bowel sounds, without guarding, and without rebound.   Neurologic:  Alert and  oriented x4;  grossly normal neurologically.  Impression/Plan: Lisa Hernandez is here for an endoscopy to be performed for dysphagia with previous esophageal strictures  Risks, benefits, limitations, and alternatives regarding  endoscopy have been reviewed with the patient.  Questions have been answered.   All parties agreeable.   Gaylyn Cheers, MD  10/27/2016, 8:54 AM

## 2016-10-27 NOTE — Anesthesia Postprocedure Evaluation (Signed)
Anesthesia Post Note  Patient: Lisa Hernandez  Procedure(s) Performed: Procedure(s) (LRB): ESOPHAGOGASTRODUODENOSCOPY (EGD) WITH PROPOFOL (N/A)  Patient location during evaluation: Endoscopy Anesthesia Type: General Level of consciousness: awake and alert Pain management: pain level controlled Vital Signs Assessment: post-procedure vital signs reviewed and stable Respiratory status: spontaneous breathing, nonlabored ventilation, respiratory function stable and patient connected to nasal cannula oxygen Cardiovascular status: blood pressure returned to baseline and stable Postop Assessment: no signs of nausea or vomiting Anesthetic complications: no    Last Vitals:  Vitals:   10/27/16 0900 10/27/16 0910  BP: 126/76 126/76  Pulse: 95 95  Resp: 20 20  Temp: (!) 36.1 C (!) 36.1 C    Last Pain:  Vitals:   10/27/16 0920  TempSrc: Tympanic                 Corie Vavra S

## 2016-10-27 NOTE — Op Note (Signed)
[facility] Gastroenterology Patient Name: Lisa Hernandez Procedure Date: 10/27/2016 8:55 AM MRN: [id number] Account #: [id number] Date of Birth: 08/18/1936 Admit Type: Outpatient Age: 80 Room: [facility] ENDO ROOM 4 Gender: Female Note Status: Finalized Procedure:            Upper GI endoscopy Indications:          Dysphagia Providers:            Manya Silvas, MD Referring MD:         [name] (Referring MD) Medicines:            Propofol per Anesthesia Complications:        No immediate complications. Procedure:            Pre-Anesthesia Assessment:                       - After reviewing the risks and benefits, the patient                        was deemed in satisfactory condition to undergo the                        procedure.                       After obtaining informed consent, the endoscope was                        passed under direct vision. Throughout the procedure,                        the patient's blood pressure, pulse, and oxygen                        saturations were monitored continuously. The Endoscope                        was introduced through the mouth, and advanced to the                        second part of duodenum. The upper GI endoscopy was                        accomplished without difficulty. The patient tolerated                        the procedure well. Findings:      One severe (stenosis; an endoscope cannot pass) benign-appearing,       intrinsic stenosis was found. And was not traversed. A guidewire was       placed and the scope was withdrawn. Dilation was performed with a Savary       dilator with no resistance at 12 mm, 13 mm, 14 mm, 15 mm and 16 mm and       mild resistance at 16 mm. Due to blood on dilators I did not pas the       scope back down after the 101mm dilator went down. Impression:           - Benign-appearing esophageal stenosis. Dilated.                       -  No specimens  collected. Recommendation:       - The findings and recommendations were discussed with                        the patient's family.                       - soft food for 3 days, eat slowly, chew well, take                        small bites.                       Schedule repeat exam and dilatation in 4-8 weeks. Manya Silvas, MD 10/27/2016 9:16:02 AM This report has been signed electronically. Number of Addenda: 0 Note Initiated On: 10/27/2016 8:55 AM      [facility]

## 2016-10-27 NOTE — Anesthesia Preprocedure Evaluation (Addendum)
Anesthesia Evaluation  Patient identified by MRN, date of birth, ID band Patient awake    Reviewed: Allergy & Precautions, NPO status , Patient's Chart, lab work & pertinent test results, reviewed documented beta blocker date and time   Airway Mallampati: II  TM Distance: >3 FB     Dental  (+) Upper Dentures, Lower Dentures   Pulmonary           Cardiovascular hypertension, Pt. on medications      Neuro/Psych    GI/Hepatic GERD  ,  Endo/Other  diabetes, Type 2  Renal/GU      Musculoskeletal  (+) Arthritis ,   Abdominal   Peds  Hematology   Anesthesia Other Findings   Reproductive/Obstetrics                            Anesthesia Physical Anesthesia Plan  ASA: III  Anesthesia Plan: General   Post-op Pain Management:    Induction:   Airway Management Planned: Nasal Cannula  Additional Equipment:   Intra-op Plan:   Post-operative Plan:   Informed Consent: I have reviewed the patients History and Physical, chart, labs and discussed the procedure including the risks, benefits and alternatives for the proposed anesthesia with the patient or authorized representative who has indicated his/her understanding and acceptance.     Plan Discussed with: CRNA  Anesthesia Plan Comments:         Anesthesia Quick Evaluation

## 2016-10-28 ENCOUNTER — Encounter: Payer: Self-pay | Admitting: Unknown Physician Specialty

## 2017-03-24 ENCOUNTER — Encounter: Payer: Self-pay | Admitting: *Deleted

## 2017-03-25 ENCOUNTER — Ambulatory Visit: Payer: Medicare Other | Admitting: Anesthesiology

## 2017-03-25 ENCOUNTER — Ambulatory Visit
Admission: RE | Admit: 2017-03-25 | Discharge: 2017-03-25 | Disposition: A | Discharge status: Home / Self Care | Payer: Medicare Other | Source: Ambulatory Visit | Attending: Unknown Physician Specialty | Admitting: Unknown Physician Specialty

## 2017-03-25 ENCOUNTER — Encounter
Admission: RE | Disposition: A | Discharge status: Home / Self Care | Payer: Self-pay | Source: Ambulatory Visit | Attending: Unknown Physician Specialty

## 2017-03-25 ENCOUNTER — Encounter: Payer: Self-pay | Admitting: *Deleted

## 2017-03-25 DIAGNOSIS — Z79899 Other long term (current) drug therapy: Secondary | ICD-10-CM | POA: Diagnosis not present

## 2017-03-25 DIAGNOSIS — K210 Gastro-esophageal reflux disease with esophagitis: Secondary | ICD-10-CM | POA: Diagnosis not present

## 2017-03-25 DIAGNOSIS — Z7984 Long term (current) use of oral hypoglycemic drugs: Secondary | ICD-10-CM | POA: Insufficient documentation

## 2017-03-25 DIAGNOSIS — R1310 Dysphagia, unspecified: Secondary | ICD-10-CM | POA: Insufficient documentation

## 2017-03-25 DIAGNOSIS — E119 Type 2 diabetes mellitus without complications: Secondary | ICD-10-CM | POA: Insufficient documentation

## 2017-03-25 DIAGNOSIS — M069 Rheumatoid arthritis, unspecified: Secondary | ICD-10-CM | POA: Diagnosis not present

## 2017-03-25 DIAGNOSIS — Z7982 Long term (current) use of aspirin: Secondary | ICD-10-CM | POA: Insufficient documentation

## 2017-03-25 DIAGNOSIS — I10 Essential (primary) hypertension: Secondary | ICD-10-CM | POA: Diagnosis not present

## 2017-03-25 DIAGNOSIS — K222 Esophageal obstruction: Secondary | ICD-10-CM | POA: Insufficient documentation

## 2017-03-25 LAB — GLUCOSE, CAPILLARY: Glucose-Capillary: 143 mg/dL — ABNORMAL HIGH (ref 65–99)

## 2017-03-25 SURGERY — ESOPHAGOGASTRODUODENOSCOPY (EGD) WITH PROPOFOL
Anesthesia: General

## 2017-03-25 MED ORDER — EPHEDRINE SULFATE 50 MG/ML IJ SOLN
INTRAMUSCULAR | Status: AC
  Filled 2017-03-25: qty 1

## 2017-03-25 MED ORDER — LIDOCAINE HCL (PF) 2 % IJ SOLN
INTRAMUSCULAR | Status: AC
  Filled 2017-03-25: qty 4

## 2017-03-25 MED ORDER — MIDAZOLAM HCL 5 MG/5ML IJ SOLN
INTRAMUSCULAR | Status: DC | PRN
  Administered 2017-03-25: 1 mg via INTRAVENOUS

## 2017-03-25 MED ORDER — PHENYLEPHRINE HCL 10 MG/ML IJ SOLN
INTRAMUSCULAR | Status: AC
  Filled 2017-03-25: qty 1

## 2017-03-25 MED ORDER — MIDAZOLAM HCL 2 MG/2ML IJ SOLN
INTRAMUSCULAR | Status: AC
  Filled 2017-03-25: qty 2

## 2017-03-25 MED ORDER — SODIUM CHLORIDE 0.9 % IV SOLN: INTRAVENOUS | Status: DC

## 2017-03-25 MED ORDER — PROPOFOL 500 MG/50ML IV EMUL
INTRAVENOUS | Status: AC
  Filled 2017-03-25: qty 50

## 2017-03-25 MED ORDER — PROPOFOL 500 MG/50ML IV EMUL
INTRAVENOUS | Status: DC | PRN
  Administered 2017-03-25: 120 ug/kg/min via INTRAVENOUS

## 2017-03-25 MED ORDER — LIDOCAINE 2% (20 MG/ML) 5 ML SYRINGE
INTRAMUSCULAR | Status: DC | PRN
  Administered 2017-03-25: 30 mg via INTRAVENOUS

## 2017-03-25 MED ORDER — PROPOFOL 10 MG/ML IV BOLUS
INTRAVENOUS | Status: AC
  Filled 2017-03-25: qty 20

## 2017-03-25 MED ORDER — FENTANYL CITRATE (PF) 100 MCG/2ML IJ SOLN
INTRAMUSCULAR | Status: DC | PRN
  Administered 2017-03-25: 50 ug via INTRAVENOUS

## 2017-03-25 MED ORDER — PROPOFOL 10 MG/ML IV BOLUS
INTRAVENOUS | Status: DC | PRN
  Administered 2017-03-25: 20 mg via INTRAVENOUS
  Administered 2017-03-25: 80 mg via INTRAVENOUS

## 2017-03-25 MED ORDER — FENTANYL CITRATE (PF) 100 MCG/2ML IJ SOLN
INTRAMUSCULAR | Status: AC
  Filled 2017-03-25: qty 2

## 2017-03-25 MED ORDER — GLYCOPYRROLATE 0.2 MG/ML IJ SOLN
INTRAMUSCULAR | Status: AC
  Filled 2017-03-25: qty 1

## 2017-03-25 MED ORDER — SODIUM CHLORIDE 0.9 % IV SOLN
INTRAVENOUS | Status: DC
  Administered 2017-03-25: 07:00:00 via INTRAVENOUS

## 2017-03-25 NOTE — Anesthesia Preprocedure Evaluation (Signed)
Anesthesia Evaluation  Patient identified by MRN, date of birth, ID band Patient awake    Reviewed: Allergy & Precautions, NPO status , Patient's Chart, lab work & pertinent test results, reviewed documented beta blocker date and time   History of Anesthesia Complications Negative for: history of anesthetic complications  Airway Mallampati: II  TM Distance: >3 FB     Dental  (+) Upper Dentures, Lower Dentures   Pulmonary neg pulmonary ROS,           Cardiovascular Exercise Tolerance: Good hypertension, Pt. on medications      Neuro/Psych negative neurological ROS  negative psych ROS   GI/Hepatic Neg liver ROS, GERD  ,  Endo/Other  diabetes, Type 2  Renal/GU negative Renal ROS  negative genitourinary   Musculoskeletal  (+) Arthritis ,   Abdominal   Peds  Hematology negative hematology ROS (+)   Anesthesia Other Findings   Reproductive/Obstetrics negative OB ROS                             Anesthesia Physical  Anesthesia Plan  ASA: III  Anesthesia Plan: General   Post-op Pain Management:    Induction:   Airway Management Planned: Nasal Cannula  Additional Equipment:   Intra-op Plan:   Post-operative Plan:   Informed Consent: I have reviewed the patients History and Physical, chart, labs and discussed the procedure including the risks, benefits and alternatives for the proposed anesthesia with the patient or authorized representative who has indicated his/her understanding and acceptance.     Plan Discussed with: CRNA  Anesthesia Plan Comments:         Anesthesia Quick Evaluation

## 2017-03-25 NOTE — Transfer of Care (Signed)
Immediate Anesthesia Transfer of Care Note  Patient: Lisa Hernandez  Procedure(s) Performed: Procedure(s): ESOPHAGOGASTRODUODENOSCOPY (EGD) WITH PROPOFOL (N/A) SAVORY DILATION (N/A)  Patient Location: PACU and Endoscopy Unit  Anesthesia Type:General  Level of Consciousness: sedated  Airway & Oxygen Therapy: Patient Spontanous Breathing and Patient connected to nasal cannula oxygen  Post-op Assessment: Report given to RN and Post -op Vital signs reviewed and stable  Post vital signs: Reviewed and stable  Last Vitals:  Vitals:   03/25/17 0703  BP: (!) 155/84  Pulse: 80  Resp: 16  Temp: 36.4 C    Last Pain:  Vitals:   03/25/17 0703  TempSrc: Tympanic         Complications: No apparent anesthesia complications

## 2017-03-25 NOTE — Anesthesia Postprocedure Evaluation (Signed)
Anesthesia Post Note  Patient: Lisa Hernandez  Procedure(s) Performed: Procedure(s) (LRB): ESOPHAGOGASTRODUODENOSCOPY (EGD) WITH PROPOFOL (N/A) SAVORY DILATION (N/A)  Patient location during evaluation: Endoscopy Anesthesia Type: General Level of consciousness: awake and alert Pain management: pain level controlled Vital Signs Assessment: post-procedure vital signs reviewed and stable Respiratory status: spontaneous breathing, nonlabored ventilation, respiratory function stable and patient connected to nasal cannula oxygen Cardiovascular status: blood pressure returned to baseline and stable Postop Assessment: no signs of nausea or vomiting Anesthetic complications: no     Last Vitals:  Vitals:   03/25/17 0754 03/25/17 0804  BP: 90/60 125/73  Pulse: 88 90  Resp: 16 20  Temp: (!) 36 C     Last Pain:  Vitals:   03/25/17 0754  TempSrc: Tympanic                 Martha Clan

## 2017-03-25 NOTE — Op Note (Signed)
[facility] Gastroenterology Patient Name: Lisa Hernandez Procedure Date: 03/25/2017 7:31 AM MRN: [id number] Account #: [id number] Date of Birth: 1935/12/28 Admit Type: Outpatient Age: 81 Room: [facility] ENDO ROOM 3 Gender: Female Note Status: Finalized Procedure:            Upper GI endoscopy Indications:          Dysphagia Providers:            Manya Silvas, MD Referring MD:         [name] (Referring MD) Medicines:            Propofol per Anesthesia Complications:        No immediate complications. Procedure:            Pre-Anesthesia Assessment:                       - After reviewing the risks and benefits, the patient                        was deemed in satisfactory condition to undergo the                        procedure.                       After obtaining informed consent, the endoscope was                        passed under direct vision. Throughout the procedure,                        the patient's blood pressure, pulse, and oxygen                        saturations were monitored continuously. The Endoscope                        was introduced through the mouth, and advanced to the                        second part of duodenum. The upper GI endoscopy was                        accomplished without difficulty. The patient tolerated                        the procedure well. Findings:      One severe benign-appearing, intrinsic stenosis was found 37 cm from the       incisors. And was traversed after dilation. A guidewire was placed and       the scope was withdrawn. Dilation was performed with a Savary dilator       with mild resistance at 15 mm and 16 mm. After this I put the scope back       down and examined the esophagus and stomach and duodenum. Villous       looking tissue at the GEJ was seen and biopsied 2 times. Stomach and       duodenum appeared otherwise normal. Impression:           - Benign-appearing esophageal stenosis.  Dilated.                       -  No specimens collected. Recommendation:       - soft food for 3 days, eat slowly, chew well, take                        small bites, resume medicines. Manya Silvas, MD 03/25/2017 7:54:52 AM This report has been signed electronically. Number of Addenda: 0 Note Initiated On: 03/25/2017 7:31 AM      [facility]

## 2017-03-25 NOTE — H&P (Signed)
Primary Care Physician:  Leonel Ramsay, MD Primary Gastroenterologist:  Dr. [name]  Pre-Procedure History & Physical: HPI:  Lisa Hernandez is a 81 y.o. female is here for an endoscopy.   Past Medical History:  Diagnosis Date  . Diabetes mellitus without complication ([place])   . Dysphagia   . Esophageal stricture   . GERD (gastroesophageal reflux disease)   . Hypertension   . Osteoarthritis    knees  . Rheumatoid arthritis ([place])   . Thyroid nodule     Past Surgical History:  Procedure Laterality Date  . ABDOMINAL HYSTERECTOMY    . adenomatous colon polyps  08/25/2016  . CHOLECYSTECTOMY    . ESOPHAGOGASTRODUODENOSCOPY (EGD) WITH PROPOFOL N/A 10/03/2015   Procedure: ESOPHAGOGASTRODUODENOSCOPY (EGD) WITH PROPOFOL;  Surgeon: Manya Silvas, MD;  Location: [facility] ENDOSCOPY;  Service: Endoscopy;  Laterality: N/A;  . ESOPHAGOGASTRODUODENOSCOPY (EGD) WITH PROPOFOL N/A 10/25/2015   Procedure: ESOPHAGOGASTRODUODENOSCOPY (EGD) WITH PROPOFOL;  Surgeon: Manya Silvas, MD;  Location: [facility] ENDOSCOPY;  Service: Endoscopy;  Laterality: N/A;   IDDM  . ESOPHAGOGASTRODUODENOSCOPY (EGD) WITH PROPOFOL N/A 10/27/2016   Procedure: ESOPHAGOGASTRODUODENOSCOPY (EGD) WITH PROPOFOL;  Surgeon: Manya Silvas, MD;  Location: [facility] ENDOSCOPY;  Service: Endoscopy;  Laterality: N/A;  Diabetic  . SAVORY DILATION N/A 10/03/2015   Procedure: SAVORY DILATION;  Surgeon: Manya Silvas, MD;  Location: [facility] ENDOSCOPY;  Service: Endoscopy;  Laterality: N/A;  . SAVORY DILATION N/A 10/25/2015   Procedure: SAVORY DILATION;  Surgeon: Manya Silvas, MD;  Location: [facility] ENDOSCOPY;  Service: Endoscopy;  Laterality: N/A;  . TONSILLECTOMY      Prior to Admission medications   Medication Sig Start Date End Date Taking? Authorizing Provider  aspirin EC 81 MG tablet Take 81 mg by mouth daily.   Yes Historical Provider, MD  gabapentin (NEURONTIN) 100 MG capsule Take 100 mg by mouth 2 (two) times daily.   Yes  Historical Provider, MD  lisinopril (PRINIVIL,ZESTRIL) 10 MG tablet Take 10 mg by mouth daily. Take 2 tabs daily   Yes Historical Provider, MD  metFORMIN (GLUCOPHAGE) 500 MG tablet Take 500 mg by mouth. Take 2 tabs in am, 1 tab in pm   Yes Historical Provider, MD  methotrexate (RHEUMATREX) 2.5 MG tablet Take 7.5 mg by mouth once a week.   Yes Historical Provider, MD  pantoprazole (PROTONIX) 20 MG tablet Take 20 mg by mouth daily.   Yes Historical Provider, MD  lansoprazole (PREVACID SOLUTAB) 30 MG disintegrating tablet Take 30 mg by mouth 2 (two) times daily.     Historical Provider, MD  meclizine (ANTIVERT) 12.5 MG tablet Take 12.5 mg by mouth 3 (three) times daily as needed for dizziness.    Historical Provider, MD  Jonetta Speak LANCETS 17C MISC 3 daily    Historical Provider, MD    Allergies as of 03/23/2017 - Review Complete 10/27/2016  Allergen Reaction Noted  . Erythromycin ethylsuccinate [erythromycin] Other (See Comments) 10/02/2015    History reviewed. No pertinent family history.  Social History   Social History  . Marital status: Divorced    Spouse name: N/A  . Number of children: N/A  . Years of education: N/A   Occupational History  . Not on file.   Social History Main Topics  . Smoking status: Never Smoker  . Smokeless tobacco: Never Used  . Alcohol use No  . Drug use: No  . Sexual activity: Not on file   Other Topics Concern  . Not on file   Social  History Narrative  . No narrative on file    Review of Systems: See HPI, otherwise negative ROS  Physical Exam: BP (!) 155/84   Pulse 80   Temp 97.5 F (36.4 C) (Tympanic)   Resp 16   SpO2 100%  General:   Alert,  pleasant and cooperative in NAD Head:  Normocephalic and atraumatic. Neck:  Supple; no masses or thyromegaly. Lungs:  Clear throughout to auscultation.    Heart:  Some irregular beats, no murmur Abdomen:  Soft, nontender and nondistended. Normal bowel sounds, without guarding, and without  rebound.   Neurologic:  Alert and  oriented x4;  grossly normal neurologically.  Impression/Plan: Lisa Hernandez is here for an endoscopy to be performed for esophageal stricture with dysphagia  Risks, benefits, limitations, and alternatives regarding  endoscopy have been reviewed with the patient.  Questions have been answered.  All parties agreeable.   Gaylyn Cheers, MD  03/25/2017, 7:23 AM

## 2017-03-25 NOTE — Anesthesia Post-op Follow-up Note (Cosign Needed)
Anesthesia QCDR form completed.        

## 2017-03-27 LAB — SURGICAL PATHOLOGY

## 2017-10-14 ENCOUNTER — Ambulatory Visit
Admission: RE | Admit: 2017-10-14 | Discharge: 2017-10-14 | Disposition: A | Discharge status: Home / Self Care | Payer: Medicare Other | Source: Ambulatory Visit | Attending: Unknown Physician Specialty | Admitting: Unknown Physician Specialty

## 2017-10-14 ENCOUNTER — Ambulatory Visit: Payer: Medicare Other | Admitting: Anesthesiology

## 2017-10-14 ENCOUNTER — Encounter: Payer: Self-pay | Admitting: *Deleted

## 2017-10-14 ENCOUNTER — Encounter
Admission: RE | Disposition: A | Discharge status: Home / Self Care | Payer: Self-pay | Source: Ambulatory Visit | Attending: Unknown Physician Specialty

## 2017-10-14 DIAGNOSIS — R1310 Dysphagia, unspecified: Secondary | ICD-10-CM | POA: Diagnosis present

## 2017-10-14 DIAGNOSIS — Z881 Allergy status to other antibiotic agents status: Secondary | ICD-10-CM | POA: Insufficient documentation

## 2017-10-14 DIAGNOSIS — K222 Esophageal obstruction: Secondary | ICD-10-CM | POA: Insufficient documentation

## 2017-10-14 DIAGNOSIS — Z90710 Acquired absence of both cervix and uterus: Secondary | ICD-10-CM | POA: Insufficient documentation

## 2017-10-14 DIAGNOSIS — K219 Gastro-esophageal reflux disease without esophagitis: Secondary | ICD-10-CM | POA: Insufficient documentation

## 2017-10-14 DIAGNOSIS — E119 Type 2 diabetes mellitus without complications: Secondary | ICD-10-CM | POA: Diagnosis not present

## 2017-10-14 DIAGNOSIS — I10 Essential (primary) hypertension: Secondary | ICD-10-CM | POA: Diagnosis not present

## 2017-10-14 DIAGNOSIS — Z7984 Long term (current) use of oral hypoglycemic drugs: Secondary | ICD-10-CM | POA: Insufficient documentation

## 2017-10-14 DIAGNOSIS — M069 Rheumatoid arthritis, unspecified: Secondary | ICD-10-CM | POA: Diagnosis not present

## 2017-10-14 DIAGNOSIS — Z7982 Long term (current) use of aspirin: Secondary | ICD-10-CM | POA: Insufficient documentation

## 2017-10-14 DIAGNOSIS — Z86010 Personal history of colonic polyps: Secondary | ICD-10-CM | POA: Insufficient documentation

## 2017-10-14 DIAGNOSIS — Z79899 Other long term (current) drug therapy: Secondary | ICD-10-CM | POA: Insufficient documentation

## 2017-10-14 HISTORY — PX: ESOPHAGOGASTRODUODENOSCOPY (EGD) WITH PROPOFOL: SHX5813

## 2017-10-14 LAB — GLUCOSE, CAPILLARY: Glucose-Capillary: 114 mg/dL — ABNORMAL HIGH (ref 65–99)

## 2017-10-14 SURGERY — ESOPHAGOGASTRODUODENOSCOPY (EGD) WITH PROPOFOL
Anesthesia: General

## 2017-10-14 MED ORDER — FENTANYL CITRATE (PF) 100 MCG/2ML IJ SOLN
INTRAMUSCULAR | Status: AC
  Filled 2017-10-14: qty 2

## 2017-10-14 MED ORDER — SODIUM CHLORIDE 0.9 % IV SOLN
INTRAVENOUS | Status: DC
  Administered 2017-10-14: 10:00:00 via INTRAVENOUS

## 2017-10-14 MED ORDER — LIDOCAINE HCL (CARDIAC) 20 MG/ML IV SOLN
INTRAVENOUS | Status: DC | PRN
  Administered 2017-10-14: 30 mg via INTRAVENOUS

## 2017-10-14 MED ORDER — PROPOFOL 500 MG/50ML IV EMUL
INTRAVENOUS | Status: AC
  Filled 2017-10-14: qty 50

## 2017-10-14 MED ORDER — PROPOFOL 500 MG/50ML IV EMUL
INTRAVENOUS | Status: DC | PRN
  Administered 2017-10-14: 100 ug/kg/min via INTRAVENOUS

## 2017-10-14 MED ORDER — SODIUM CHLORIDE 0.9 % IV SOLN: INTRAVENOUS | Status: DC

## 2017-10-14 MED ORDER — BUTAMBEN-TETRACAINE-BENZOCAINE 2-2-14 % EX AERO
INHALATION_SPRAY | CUTANEOUS | Status: DC | PRN
  Administered 2017-10-14: 2 via TOPICAL

## 2017-10-14 MED ORDER — LIDOCAINE HCL (PF) 2 % IJ SOLN
INTRAMUSCULAR | Status: AC
  Filled 2017-10-14: qty 10

## 2017-10-14 MED ORDER — FENTANYL CITRATE (PF) 100 MCG/2ML IJ SOLN
INTRAMUSCULAR | Status: DC | PRN
  Administered 2017-10-14: 50 ug via INTRAVENOUS
  Administered 2017-10-14: 25 ug via INTRAVENOUS

## 2017-10-14 NOTE — H&P (Signed)
Primary Care Physician:  Leonel Ramsay, MD Primary Gastroenterologist:  Dr. [name]  Pre-Procedure History & Physical: HPI:  Lisa Hernandez is a 81 y.o. female is here for an upper endoscopy for dysphagia.   Past Medical History:  Diagnosis Date  . Diabetes mellitus without complication ([place])   . Dysphagia   . Esophageal stricture   . GERD (gastroesophageal reflux disease)   . Hypertension   . Osteoarthritis    knees  . Rheumatoid arthritis ([place])   . Thyroid nodule     Past Surgical History:  Procedure Laterality Date  . ABDOMINAL HYSTERECTOMY    . adenomatous colon polyps  08/25/2016  . CHOLECYSTECTOMY    . ESOPHAGOGASTRODUODENOSCOPY (EGD) WITH PROPOFOL N/A 10/03/2015   Procedure: ESOPHAGOGASTRODUODENOSCOPY (EGD) WITH PROPOFOL;  Surgeon: Manya Silvas, MD;  Location: [facility] ENDOSCOPY;  Service: Endoscopy;  Laterality: N/A;  . ESOPHAGOGASTRODUODENOSCOPY (EGD) WITH PROPOFOL N/A 10/25/2015   Procedure: ESOPHAGOGASTRODUODENOSCOPY (EGD) WITH PROPOFOL;  Surgeon: Manya Silvas, MD;  Location: [facility] ENDOSCOPY;  Service: Endoscopy;  Laterality: N/A;   IDDM  . ESOPHAGOGASTRODUODENOSCOPY (EGD) WITH PROPOFOL N/A 10/27/2016   Procedure: ESOPHAGOGASTRODUODENOSCOPY (EGD) WITH PROPOFOL;  Surgeon: Manya Silvas, MD;  Location: [facility] ENDOSCOPY;  Service: Endoscopy;  Laterality: N/A;  Diabetic  . SAVORY DILATION N/A 10/03/2015   Procedure: SAVORY DILATION;  Surgeon: Manya Silvas, MD;  Location: [facility] ENDOSCOPY;  Service: Endoscopy;  Laterality: N/A;  . SAVORY DILATION N/A 10/25/2015   Procedure: SAVORY DILATION;  Surgeon: Manya Silvas, MD;  Location: [facility] ENDOSCOPY;  Service: Endoscopy;  Laterality: N/A;  . TONSILLECTOMY      Prior to Admission medications   Medication Sig Start Date End Date Taking? Authorizing Provider  aspirin EC 81 MG tablet Take 81 mg by mouth daily.   Yes [provider]  gabapentin (NEURONTIN) 100 MG capsule Take 100 mg by mouth 2 (two) times  daily.   Yes [provider]  lisinopril (PRINIVIL,ZESTRIL) 10 MG tablet Take 10 mg by mouth daily. Take 2 tabs daily   Yes [provider]  metFORMIN (GLUCOPHAGE) 500 MG tablet Take 500 mg by mouth. Take 2 tabs in am, 1 tab in pm   Yes [provider]  methotrexate (RHEUMATREX) 2.5 MG tablet Take 7.5 mg by mouth once a week.   Yes [provider]  pantoprazole (PROTONIX) 20 MG tablet Take 20 mg by mouth daily.   Yes [provider]  lansoprazole (PREVACID SOLUTAB) 30 MG disintegrating tablet Take 30 mg by mouth 2 (two) times daily.     [provider]  meclizine (ANTIVERT) 12.5 MG tablet Take 12.5 mg by mouth 3 (three) times daily as needed for dizziness.    [provider]  Jonetta Speak LANCETS 85I MISC 3 daily    [provider]    Allergies as of 05/26/2017 - Review Complete 03/25/2017  Allergen Reaction Noted  . Erythromycin ethylsuccinate [erythromycin] Other (See Comments) 10/02/2015    History reviewed. No pertinent family history.  Social History   Social History  . Marital status: Divorced    Spouse name: N/A  . Number of children: N/A  . Years of education: N/A   Occupational History  . Not on file.   Social History Main Topics  . Smoking status: Never Smoker  . Smokeless tobacco: Never Used  . Alcohol use No  . Drug use: No  . Sexual activity: Not on file   Other Topics Concern  . Not on file  Social History Narrative  . No narrative on file    Review of Systems: See HPI, otherwise negative ROS  Physical Exam: BP (!) 174/67   Pulse (!) 54   Temp (!) 96.7 F (35.9 C) (Tympanic)   Resp 20   Ht 5\' 1"  (1.549 m)   Wt 55.3 kg (122 lb)   SpO2 100%   BMI 23.05 kg/m  General:   Alert,  pleasant and cooperative in NAD Head:  Normocephalic and atraumatic. Neck:  Supple; no masses or thyromegaly. Lungs:  Clear throughout to auscultation.    Heart:  Regular rate and rhythm. Abdomen:   Soft, nontender and nondistended. Normal bowel sounds, without guarding, and without rebound.   Neurologic:  Alert and  oriented x4;  grossly normal neurologically.  Impression/Plan: Lisa Hernandez is here for an upper endoscopy for dysphagia.  Risks, benefits, limitations, and alternatives regarding  endoscopy have been reviewed with the patient.  Questions have been answered.  All parties agreeable.   Gaylyn Cheers, MD  10/14/2017, 10:31 AM

## 2017-10-14 NOTE — Anesthesia Post-op Follow-up Note (Signed)
Anesthesia QCDR form completed.        

## 2017-10-14 NOTE — Op Note (Signed)
[facility] Gastroenterology Patient Name: Lisa Hernandez Procedure Date: 10/14/2017 10:33 AM MRN: [id number] Account #: [id number] Date of Birth: 06-18-1936 Admit Type: Outpatient Age: 81 Room: [facility] ENDO ROOM 3 Gender: Female Note Status: Finalized Procedure:            Upper GI endoscopy Indications:          Dysphagia Providers:            Manya Silvas, MD Referring MD:         [name] (Referring MD) Medicines:            Propofol per Anesthesia Complications:        No immediate complications. Procedure:            Pre-Anesthesia Assessment:                       - After reviewing the risks and benefits, the patient                        was deemed in satisfactory condition to undergo the                        procedure.                       After obtaining informed consent, the endoscope was                        passed under direct vision. Throughout the procedure,                        the patient's blood pressure, pulse, and oxygen                        saturations were monitored continuously. The Endoscope                        was introduced through the mouth, and advanced to the                        esophagus down to the cardia. The upper GI endoscopy                        was accomplished without difficulty. The patient                        tolerated the procedure well. Findings:      One severe (stenosis; an endoscope cannot pass) benign-appearing,       intrinsic stenosis was found. And was not traversed. Placement of wire       with floppy tip was done. was attempted. This passed successfully.       plaques of adherent scar tissue seen in distal esophagus.      After placement of the wire I passed a 3 F and a 17F and a 81F and a       16F with good results. Impression:           - Benign-appearing esophageal stenosis.                       - No specimens collected. Recommendation:       -  The findings and recommendations  were discussed with                        the patient's family. Need to eat very soft food and                        dilate her again. in 3-4 weeks. Take medicine to help                        shut off acid. Manya Silvas, MD 10/14/2017 10:50:44 AM This report has been signed electronically. Number of Addenda: 0 Note Initiated On: 10/14/2017 10:33 AM      [facility]

## 2017-10-14 NOTE — Transfer of Care (Signed)
Immediate Anesthesia Transfer of Care Note  Patient: Lisa Hernandez  Procedure(s) Performed: ESOPHAGOGASTRODUODENOSCOPY (EGD) WITH PROPOFOL (N/A )  Patient Location: PACU  Anesthesia Type:General  Level of Consciousness: awake and sedated  Airway & Oxygen Therapy: Patient Spontanous Breathing and Patient connected to nasal cannula oxygen  Post-op Assessment: Report given to RN and Post -op Vital signs reviewed and stable  Post vital signs: Reviewed and stable  Last Vitals:  Vitals:   10/14/17 0939  BP: (!) 174/67  Pulse: (!) 54  Resp: 20  Temp: (!) 35.9 C  SpO2: 100%    Last Pain:  Vitals:   10/14/17 0939  TempSrc: Tympanic         Complications: No apparent anesthesia complications

## 2017-10-14 NOTE — Anesthesia Postprocedure Evaluation (Signed)
Anesthesia Post Note  Patient: Lisa Hernandez  Procedure(s) Performed: ESOPHAGOGASTRODUODENOSCOPY (EGD) WITH PROPOFOL (N/A )  Patient location during evaluation: PACU Anesthesia Type: General Level of consciousness: awake Pain management: pain level controlled Vital Signs Assessment: post-procedure vital signs reviewed and stable Respiratory status: spontaneous breathing Cardiovascular status: stable Anesthetic complications: no     Last Vitals:  Vitals:   10/14/17 1112 10/14/17 1132  BP: (!) 151/59 (!) 148/58  Pulse: 63 66  Resp: 16 16  Temp:    SpO2: 100% 100%    Last Pain:  Vitals:   10/14/17 1052  TempSrc: Tympanic                 VAN STAVEREN,Livia Tarr

## 2017-10-14 NOTE — Anesthesia Procedure Notes (Signed)
Performed by: COOK-MARTIN, Malea Swilling Pre-anesthesia Checklist: Patient identified, Emergency Drugs available, Suction available, Patient being monitored and Timeout performed Patient Re-evaluated:Patient Re-evaluated prior to induction Oxygen Delivery Method: Nasal cannula Preoxygenation: Pre-oxygenation with 100% oxygen Induction Type: IV induction Airway Equipment and Method: Bite block Placement Confirmation: positive ETCO2 and CO2 detector       

## 2017-10-14 NOTE — Anesthesia Preprocedure Evaluation (Addendum)
Anesthesia Evaluation  Patient identified by MRN, date of birth, ID band Patient awake    Reviewed: Allergy & Precautions, NPO status , Patient's Chart, lab work & pertinent test results  Airway Mallampati: II       Dental  (+) Upper Dentures, Lower Dentures   Pulmonary neg pulmonary ROS,    breath sounds clear to auscultation       Cardiovascular Exercise Tolerance: Good hypertension, Pt. on medications  Rhythm:Regular Rate:Normal     Neuro/Psych negative neurological ROS     GI/Hepatic Neg liver ROS, GERD  Medicated,  Endo/Other  diabetes, Type 2, Oral Hypoglycemic Agents  Renal/GU      Musculoskeletal   Abdominal Normal abdominal exam  (+)   Peds negative pediatric ROS (+)  Hematology negative hematology ROS (+)   Anesthesia Other Findings   Reproductive/Obstetrics                            Anesthesia Physical Anesthesia Plan  ASA: III  Anesthesia Plan: General   Post-op Pain Management:    Induction: Intravenous  PONV Risk Score and Plan:   Airway Management Planned: Natural Airway and Nasal Cannula  Additional Equipment:   Intra-op Plan:   Post-operative Plan:   Informed Consent: I have reviewed the patients History and Physical, chart, labs and discussed the procedure including the risks, benefits and alternatives for the proposed anesthesia with the patient or authorized representative who has indicated his/her understanding and acceptance.     Plan Discussed with: CRNA  Anesthesia Plan Comments:         Anesthesia Quick Evaluation

## 2017-10-16 ENCOUNTER — Encounter: Payer: Self-pay | Admitting: Unknown Physician Specialty

## 2017-10-20 ENCOUNTER — Encounter: Payer: Self-pay | Admitting: Unknown Physician Specialty

## 2017-10-23 ENCOUNTER — Inpatient Hospital Stay (HOSPITAL_COMMUNITY)
Admit: 2017-10-23 | Discharge: 2017-10-23 | Disposition: A | Discharge status: Home / Self Care | Payer: Medicare Other | Attending: Internal Medicine | Admitting: Internal Medicine

## 2017-10-23 ENCOUNTER — Other Ambulatory Visit: Payer: Self-pay

## 2017-10-23 ENCOUNTER — Encounter: Payer: Self-pay | Admitting: Emergency Medicine

## 2017-10-23 ENCOUNTER — Emergency Department: Payer: Medicare Other

## 2017-10-23 ENCOUNTER — Inpatient Hospital Stay
Admission: EM | Admit: 2017-10-23 | Discharge: 2017-10-28 | DRG: 280 | Disposition: A | Discharge status: Home / Self Care | Payer: Medicare Other | Attending: Internal Medicine | Admitting: Internal Medicine

## 2017-10-23 DIAGNOSIS — D649 Anemia, unspecified: Secondary | ICD-10-CM | POA: Diagnosis present

## 2017-10-23 DIAGNOSIS — M069 Rheumatoid arthritis, unspecified: Secondary | ICD-10-CM | POA: Diagnosis present

## 2017-10-23 DIAGNOSIS — Z7982 Long term (current) use of aspirin: Secondary | ICD-10-CM

## 2017-10-23 DIAGNOSIS — I340 Nonrheumatic mitral (valve) insufficiency: Secondary | ICD-10-CM

## 2017-10-23 DIAGNOSIS — R079 Chest pain, unspecified: Secondary | ICD-10-CM | POA: Diagnosis not present

## 2017-10-23 DIAGNOSIS — R634 Abnormal weight loss: Secondary | ICD-10-CM

## 2017-10-23 DIAGNOSIS — I200 Unstable angina: Secondary | ICD-10-CM

## 2017-10-23 DIAGNOSIS — D702 Other drug-induced agranulocytosis: Secondary | ICD-10-CM | POA: Diagnosis not present

## 2017-10-23 DIAGNOSIS — I214 Non-ST elevation (NSTEMI) myocardial infarction: Principal | ICD-10-CM | POA: Diagnosis present

## 2017-10-23 DIAGNOSIS — I5021 Acute systolic (congestive) heart failure: Secondary | ICD-10-CM | POA: Diagnosis present

## 2017-10-23 DIAGNOSIS — Z86010 Personal history of colonic polyps: Secondary | ICD-10-CM

## 2017-10-23 DIAGNOSIS — I110 Hypertensive heart disease with heart failure: Secondary | ICD-10-CM | POA: Diagnosis present

## 2017-10-23 DIAGNOSIS — I255 Ischemic cardiomyopathy: Secondary | ICD-10-CM | POA: Diagnosis present

## 2017-10-23 DIAGNOSIS — R531 Weakness: Secondary | ICD-10-CM | POA: Diagnosis not present

## 2017-10-23 DIAGNOSIS — E119 Type 2 diabetes mellitus without complications: Secondary | ICD-10-CM

## 2017-10-23 DIAGNOSIS — D709 Neutropenia, unspecified: Secondary | ICD-10-CM

## 2017-10-23 DIAGNOSIS — Z794 Long term (current) use of insulin: Secondary | ICD-10-CM

## 2017-10-23 DIAGNOSIS — K222 Esophageal obstruction: Secondary | ICD-10-CM | POA: Diagnosis not present

## 2017-10-23 DIAGNOSIS — E538 Deficiency of other specified B group vitamins: Secondary | ICD-10-CM | POA: Diagnosis present

## 2017-10-23 DIAGNOSIS — Z7984 Long term (current) use of oral hypoglycemic drugs: Secondary | ICD-10-CM | POA: Diagnosis not present

## 2017-10-23 DIAGNOSIS — Z79899 Other long term (current) drug therapy: Secondary | ICD-10-CM | POA: Diagnosis not present

## 2017-10-23 DIAGNOSIS — E43 Unspecified severe protein-calorie malnutrition: Secondary | ICD-10-CM | POA: Diagnosis present

## 2017-10-23 DIAGNOSIS — E876 Hypokalemia: Secondary | ICD-10-CM | POA: Diagnosis present

## 2017-10-23 DIAGNOSIS — Z888 Allergy status to other drugs, medicaments and biological substances status: Secondary | ICD-10-CM | POA: Diagnosis not present

## 2017-10-23 DIAGNOSIS — Z809 Family history of malignant neoplasm, unspecified: Secondary | ICD-10-CM

## 2017-10-23 DIAGNOSIS — D689 Coagulation defect, unspecified: Secondary | ICD-10-CM

## 2017-10-23 DIAGNOSIS — D61818 Other pancytopenia: Secondary | ICD-10-CM | POA: Diagnosis present

## 2017-10-23 DIAGNOSIS — M1990 Unspecified osteoarthritis, unspecified site: Secondary | ICD-10-CM | POA: Diagnosis present

## 2017-10-23 DIAGNOSIS — K219 Gastro-esophageal reflux disease without esophagitis: Secondary | ICD-10-CM | POA: Diagnosis present

## 2017-10-23 DIAGNOSIS — Z6822 Body mass index (BMI) 22.0-22.9, adult: Secondary | ICD-10-CM

## 2017-10-23 DIAGNOSIS — Z833 Family history of diabetes mellitus: Secondary | ICD-10-CM | POA: Diagnosis not present

## 2017-10-23 DIAGNOSIS — D696 Thrombocytopenia, unspecified: Secondary | ICD-10-CM | POA: Diagnosis not present

## 2017-10-23 DIAGNOSIS — I10 Essential (primary) hypertension: Secondary | ICD-10-CM

## 2017-10-23 LAB — TROPONIN I
TROPONIN I: 0.96 ng/mL — AB (ref <0.03)
TROPONIN I: 2.03 ng/mL — AB (ref <0.03)
Troponin I: 0.2 ng/mL (ref <0.03)

## 2017-10-23 LAB — BASIC METABOLIC PANEL
ANION GAP: 10 (ref 5–15)
BUN: 22 mg/dL — AB (ref 6–20)
CHLORIDE: 104 mmol/L (ref 101–111)
CO2: 22 mmol/L (ref 22–32)
Calcium: 6.9 mg/dL — ABNORMAL LOW (ref 8.9–10.3)
Creatinine, Ser: 0.96 mg/dL (ref 0.44–1.00)
GFR, EST NON AFRICAN AMERICAN: 55 mL/min — AB (ref >60)
Glucose, Bld: 142 mg/dL — ABNORMAL HIGH (ref 65–99)
POTASSIUM: 2.5 mmol/L — AB (ref 3.5–5.1)
Sodium: 136 mmol/L (ref 135–145)

## 2017-10-23 LAB — ABO/RH: ABO/RH(D): A POS

## 2017-10-23 LAB — FOLATE: Folate: 3 ng/mL — ABNORMAL LOW (ref >5.9)

## 2017-10-23 LAB — CBC WITH DIFFERENTIAL/PLATELET
BASOS PCT: 0 %
Basophils Absolute: 0 10*3/uL (ref 0–0.1)
EOS PCT: 1 %
Eosinophils Absolute: 0 10*3/uL (ref 0–0.7)
HCT: 18.6 % — ABNORMAL LOW (ref 35.0–47.0)
HEMOGLOBIN: 6.4 g/dL — AB (ref 12.0–16.0)
LYMPHS PCT: 53 %
Lymphs Abs: 0.8 10*3/uL — ABNORMAL LOW (ref 1.0–3.6)
MCH: 32.9 pg (ref 26.0–34.0)
MCHC: 34.3 g/dL (ref 32.0–36.0)
MCV: 95.8 fL (ref 80.0–100.0)
MONO ABS: 0 10*3/uL — AB (ref 0.2–0.9)
Monocytes Relative: 1 %
NEUTROS PCT: 45 %
Neutro Abs: 0.7 10*3/uL — ABNORMAL LOW (ref 1.4–6.5)
Platelets: 31 10*3/uL — ABNORMAL LOW (ref 150–440)
RBC: 1.94 MIL/uL — AB (ref 3.80–5.20)
RDW: 18.4 % — ABNORMAL HIGH (ref 11.5–14.5)
WBC: 1.5 10*3/uL — AB (ref 3.6–11.0)

## 2017-10-23 LAB — CBC
HEMATOCRIT: 18.3 % — AB (ref 35.0–47.0)
HEMOGLOBIN: 6.2 g/dL — AB (ref 12.0–16.0)
MCH: 32.5 pg (ref 26.0–34.0)
MCHC: 34.1 g/dL (ref 32.0–36.0)
MCV: 95.3 fL (ref 80.0–100.0)
Platelets: 27 10*3/uL — CL (ref 150–440)
RBC: 1.92 MIL/uL — AB (ref 3.80–5.20)
RDW: 18.6 % — ABNORMAL HIGH (ref 11.5–14.5)
WBC: 1.3 10*3/uL — AB (ref 3.6–11.0)

## 2017-10-23 LAB — HEMOGLOBIN A1C
HEMOGLOBIN A1C: 5.9 % — AB (ref 4.8–5.6)
Mean Plasma Glucose: 122.63 mg/dL

## 2017-10-23 LAB — ECHOCARDIOGRAM COMPLETE
HEIGHTINCHES: 61 in
Weight: 1905.6 oz

## 2017-10-23 LAB — VITAMIN B12: Vitamin B-12: 143 pg/mL — ABNORMAL LOW (ref 180–914)

## 2017-10-23 LAB — IRON AND TIBC
IRON: 182 ug/dL — AB (ref 28–170)
SATURATION RATIOS: 89 % — AB (ref 10.4–31.8)
TIBC: 204 ug/dL — ABNORMAL LOW (ref 250–450)
UIBC: 22 ug/dL

## 2017-10-23 LAB — URIC ACID: Uric Acid, Serum: 5.5 mg/dL (ref 2.3–6.6)

## 2017-10-23 LAB — GLUCOSE, CAPILLARY
Glucose-Capillary: 148 mg/dL — ABNORMAL HIGH (ref 65–99)
Glucose-Capillary: 171 mg/dL — ABNORMAL HIGH (ref 65–99)

## 2017-10-23 LAB — PREPARE RBC (CROSSMATCH)

## 2017-10-23 LAB — BRAIN NATRIURETIC PEPTIDE: B Natriuretic Peptide: 2087 pg/mL — ABNORMAL HIGH (ref 0.0–100.0)

## 2017-10-23 LAB — ALBUMIN: ALBUMIN: 3 g/dL — AB (ref 3.5–5.0)

## 2017-10-23 LAB — LACTATE DEHYDROGENASE: LDH: 354 U/L — AB (ref 98–192)

## 2017-10-23 LAB — MAGNESIUM: MAGNESIUM: 1 mg/dL — AB (ref 1.7–2.4)

## 2017-10-23 LAB — FERRITIN: Ferritin: 757 ng/mL — ABNORMAL HIGH (ref 11–307)

## 2017-10-23 MED ORDER — ATORVASTATIN CALCIUM 10 MG PO TABS
10.0000 mg | ORAL_TABLET | Freq: Every day | ORAL | Status: DC
  Administered 2017-10-23 – 2017-10-27 (×5): 10 mg via ORAL
  Filled 2017-10-23 (×5): qty 1

## 2017-10-23 MED ORDER — ONDANSETRON HCL 4 MG PO TABS: 4.0000 mg | ORAL_TABLET | Freq: Four times a day (QID) | ORAL | Status: DC | PRN

## 2017-10-23 MED ORDER — SENNOSIDES-DOCUSATE SODIUM 8.6-50 MG PO TABS: 1.0000 | ORAL_TABLET | Freq: Every evening | ORAL | Status: DC | PRN

## 2017-10-23 MED ORDER — POTASSIUM CHLORIDE CRYS ER 20 MEQ PO TBCR
40.0000 meq | EXTENDED_RELEASE_TABLET | Freq: Once | ORAL | Status: AC
  Administered 2017-10-23: 40 meq via ORAL
  Filled 2017-10-23: qty 2

## 2017-10-23 MED ORDER — INSULIN ASPART 100 UNIT/ML ~~LOC~~ SOLN
0.0000 [IU] | Freq: Every day | SUBCUTANEOUS | Status: DC
Start: 2017-10-23 — End: 2017-10-28

## 2017-10-23 MED ORDER — POTASSIUM CHLORIDE IN NACL 40-0.9 MEQ/L-% IV SOLN
INTRAVENOUS | Status: DC
  Administered 2017-10-23 – 2017-10-24 (×2): 75 mL/h via INTRAVENOUS
  Filled 2017-10-23 (×2): qty 1000

## 2017-10-23 MED ORDER — POTASSIUM CHLORIDE 10 MEQ/100ML IV SOLN
10.0000 meq | INTRAVENOUS | Status: AC
  Administered 2017-10-23 (×3): 10 meq via INTRAVENOUS
  Filled 2017-10-23 (×4): qty 100

## 2017-10-23 MED ORDER — FUROSEMIDE 10 MG/ML IJ SOLN
20.0000 mg | Freq: Two times a day (BID) | INTRAMUSCULAR | Status: DC
  Administered 2017-10-23 – 2017-10-24 (×2): 20 mg via INTRAVENOUS
  Filled 2017-10-23 (×2): qty 2

## 2017-10-23 MED ORDER — SODIUM CHLORIDE 0.9% FLUSH: 3.0000 mL | INTRAVENOUS | Status: DC | PRN

## 2017-10-23 MED ORDER — FUROSEMIDE 10 MG/ML IJ SOLN
20.0000 mg | Freq: Once | INTRAMUSCULAR | Status: AC
  Administered 2017-10-23: 20 mg via INTRAVENOUS
  Filled 2017-10-23: qty 2

## 2017-10-23 MED ORDER — SODIUM CHLORIDE 0.9% FLUSH
3.0000 mL | Freq: Two times a day (BID) | INTRAVENOUS | Status: DC
  Administered 2017-10-23 – 2017-10-28 (×10): 3 mL via INTRAVENOUS

## 2017-10-23 MED ORDER — SODIUM CHLORIDE 0.9 % IV SOLN: 250.0000 mL | INTRAVENOUS | Status: DC | PRN

## 2017-10-23 MED ORDER — HEPARIN SODIUM (PORCINE) 5000 UNIT/ML IJ SOLN: 5000.0000 [IU] | Freq: Three times a day (TID) | INTRAMUSCULAR | Status: DC

## 2017-10-23 MED ORDER — SODIUM CHLORIDE 0.9 % IV SOLN: Freq: Once | INTRAVENOUS | Status: DC

## 2017-10-23 MED ORDER — METOPROLOL TARTRATE 25 MG PO TABS
12.5000 mg | ORAL_TABLET | Freq: Two times a day (BID) | ORAL | Status: DC
  Administered 2017-10-23 – 2017-10-24 (×3): 12.5 mg via ORAL
  Filled 2017-10-23 (×4): qty 1

## 2017-10-23 MED ORDER — BISACODYL 5 MG PO TBEC: 5.0000 mg | DELAYED_RELEASE_TABLET | Freq: Every day | ORAL | Status: DC | PRN

## 2017-10-23 MED ORDER — ADULT MULTIVITAMIN LIQUID CH
15.0000 mL | Freq: Every day | ORAL | Status: DC
  Administered 2017-10-23 – 2017-10-28 (×6): 15 mL via ORAL
  Filled 2017-10-23 (×6): qty 15

## 2017-10-23 MED ORDER — ALBUTEROL SULFATE (2.5 MG/3ML) 0.083% IN NEBU: 2.5000 mg | INHALATION_SOLUTION | RESPIRATORY_TRACT | Status: DC | PRN

## 2017-10-23 MED ORDER — ASPIRIN 325 MG PO TABS
325.0000 mg | ORAL_TABLET | Freq: Every day | ORAL | Status: DC
  Filled 2017-10-23: qty 1

## 2017-10-23 MED ORDER — GABAPENTIN 100 MG PO CAPS
100.0000 mg | ORAL_CAPSULE | Freq: Two times a day (BID) | ORAL | Status: DC
  Administered 2017-10-23 – 2017-10-28 (×11): 100 mg via ORAL
  Filled 2017-10-23 (×12): qty 1

## 2017-10-23 MED ORDER — ENSURE ENLIVE PO LIQD
237.0000 mL | Freq: Two times a day (BID) | ORAL | Status: DC
  Administered 2017-10-24 – 2017-10-25 (×3): 237 mL via ORAL

## 2017-10-23 MED ORDER — ONDANSETRON HCL 4 MG/2ML IJ SOLN: 4.0000 mg | Freq: Four times a day (QID) | INTRAMUSCULAR | Status: DC | PRN

## 2017-10-23 MED ORDER — MECLIZINE HCL 12.5 MG PO TABS
12.5000 mg | ORAL_TABLET | Freq: Three times a day (TID) | ORAL | Status: DC | PRN
  Filled 2017-10-23: qty 1

## 2017-10-23 MED ORDER — INSULIN ASPART 100 UNIT/ML ~~LOC~~ SOLN
0.0000 [IU] | Freq: Three times a day (TID) | SUBCUTANEOUS | Status: DC
  Administered 2017-10-23: 1 [IU] via SUBCUTANEOUS
  Administered 2017-10-24 (×3): 2 [IU] via SUBCUTANEOUS
  Administered 2017-10-25: 1 [IU] via SUBCUTANEOUS
  Administered 2017-10-26: 2 [IU] via SUBCUTANEOUS
  Administered 2017-10-27: 1 [IU] via SUBCUTANEOUS
  Administered 2017-10-27: 2 [IU] via SUBCUTANEOUS
  Administered 2017-10-28: 3 [IU] via SUBCUTANEOUS
  Filled 2017-10-23 (×9): qty 1

## 2017-10-23 MED ORDER — MAGNESIUM SULFATE 4 GM/100ML IV SOLN
4.0000 g | Freq: Once | INTRAVENOUS | Status: AC
  Administered 2017-10-23: 4 g via INTRAVENOUS
  Filled 2017-10-23 (×2): qty 100

## 2017-10-23 MED ORDER — ACETAMINOPHEN 325 MG PO TABS: 650.0000 mg | ORAL_TABLET | Freq: Four times a day (QID) | ORAL | Status: DC | PRN

## 2017-10-23 MED ORDER — HYDROCODONE-ACETAMINOPHEN 5-325 MG PO TABS
1.0000 | ORAL_TABLET | ORAL | Status: DC | PRN
  Administered 2017-10-24 – 2017-10-27 (×5): 1 via ORAL
  Filled 2017-10-23 (×5): qty 1

## 2017-10-23 MED ORDER — ACETAMINOPHEN 650 MG RE SUPP: 650.0000 mg | Freq: Four times a day (QID) | RECTAL | Status: DC | PRN

## 2017-10-23 NOTE — Progress Notes (Signed)
*  PRELIMINARY RESULTS* Echocardiogram 2D Echocardiogram has been performed.  Sherrie Sport 10/23/2017, 3:26 PM

## 2017-10-23 NOTE — Progress Notes (Signed)
PT Cancellation Note  Patient Details Name: Lisa Hernandez MRN: [id number] DOB: 1935/12/20   Cancelled Treatment:    Reason Eval/Treat Not Completed: Medical issues which prohibited therapy; Pt's Ka 2.5, Hg 6.2, HCT 18.3 all of which fall outside guidelines for participation with PT services.  Will follow patient and attempt PT eval at a future date as medically appropriate.     Linus Salmons PT, DPT 10/23/17, 2:49 PM

## 2017-10-23 NOTE — ED Triage Notes (Signed)
Pt walked in, appearing weak and having difficulty. Color seems off in face when walked in.  Woke up with chest pain to left chest radiating to left arm.  Pain woke out of sleep. Has been constant and having SHOB.  Has been generally weak for 1 week now as well but pain started today at 4 am.

## 2017-10-23 NOTE — Progress Notes (Signed)
Initial Nutrition Assessment  DOCUMENTATION CODES:   Severe malnutrition in context of acute illness/injury  INTERVENTION:   Dysphagia 3 diet   Recommend check Phosphorus levels   Ensure Enlive po BID, each supplement provides 350 kcal and 20 grams of protein  MVI  NUTRITION DIAGNOSIS:   Severe Malnutrition related to acute illness(DM) as evidenced by energy intake < or equal to 50% for > or equal to 5 days, mild fat depletion, moderate muscle depletion, 2 percent weight loss in one week.  GOAL:   Patient will meet greater than or equal to 90% of their needs  MONITOR:   PO intake, Supplement acceptance, Labs, Weight trends, I & O's  REASON FOR ASSESSMENT:   Consult Assessment of nutrition requirement/status  ASSESSMENT:   81 y.o. female with a known history of hypertension, diabetes, GERD, esophageal stricture, dysphagia, and the rheumatoid arthritis presents for chest pain    Met with pt in room today. Pt reports poor appetite and oral intake for 3 weeks pta. Pt reports worsening dysphagia over the past few months; pt with esophageal stricture. Pt is unable to swallow meats and tries to avoid them. Pt does not drink any supplements at home. Pt reports that she eats soft, well cooked foods. Pt does not want a pureed diet. Pt is currently eating <25% of her meals. Pt is willing to drink Ensure, RD will order. Per chart, pt has lost 3lbs(2%) in 1 week; this is significant. Pt reports her UBW is around 125lbs. Pt with low K and Mg; this is being supplemented. Recommend check Phosphorus levels.   Medications reviewed and include: lasix, insulin, KCl, NaCl w/ KCl '@75ml' /hr, Mg sulfate  Labs reviewed: K 2.5(L), BUN 22(H), Ca 6.9(L) adj. 7.7(L), Mg 1.0(L), alb 3.0(L) WBC- 1.3(L), Hgb 6.2(L), Hct 18.3(L)  Nutrition-Focused physical exam completed. Findings are mild fat depletion in orbital and buccal regions, moderate muscle depletions in clavicles, temporal regions, and patellas,  and mild edema BLE.   Diet Order:  Diet heart healthy/carb modified Room service appropriate? Yes; Fluid consistency: Thin  EDUCATION NEEDS:   Education needs have been addressed  Skin:  Reviewed RN Assessment  Last BM:  11/9- type 7  Height:   Ht Readings from Last 1 Encounters:  10/23/17 '5\' 1"'  (1.549 m)    Weight:   Wt Readings from Last 1 Encounters:  10/23/17 119 lb 1.6 oz (54 kg)    Ideal Body Weight:  47.7 kg  BMI:  Body mass index is 22.5 kg/m.  Estimated Nutritional Needs:   Kcal:  1300-1500kcal/day   Protein:  65-75g/day   Fluid:  >1.5L/day   Koleen Distance MS, RD, LDN Pager #[phone] After Hours Pager: [phone]

## 2017-10-23 NOTE — Progress Notes (Signed)
Notified Dr. Stanford Breed of TIS of 2.03. Pt with no complaints of any chest pain at this time. No new orders at this time. Will continue to monitor and assess.

## 2017-10-23 NOTE — ED Notes (Signed)
Called to call report

## 2017-10-23 NOTE — ED Notes (Signed)
Date and time results received: 10/23/17 0843  (use smartphrase ".now" to insert current time)  Test: WBC Critical Value: 11.3  Name of Provider Notified: Dr. Dineen Kid  Orders Received? Or Actions Taken?: provider notified

## 2017-10-23 NOTE — ED Provider Notes (Signed)
[facility] Emergency Department Provider Note  ____________________________________________   First MD Initiated Contact with Patient 10/23/17 [phone]     (approximate)  I have reviewed the triage vital signs and the nursing notes.   HISTORY  Chief Complaint Chest Pain   HPI Lisa Hernandez is a 81 y.o. female with a history of dysphasia and rheumatoid arthritis who is presenting to the emergency department today with 1 week of chest pain.  Says the pain is a 6 out of 10 at this time and feels like a tingling which radiates into her left shoulder.  She says that it has been intermittent up to this point is now constant since 4 AM.  She is associating the pain with shortness of breath as well as weakness.  No nausea or vomiting.  Denies any blood in her stool.  No history of cardiac disease.  Patient also takes methotrexate.  Patient said that the pain was not brought on or relieved by anything when she was having the intermittent pain.  Said that it would last up to several hours at a time.    Past Medical History:  Diagnosis Date  . Diabetes mellitus without complication ([place])   . Dysphagia   . Esophageal stricture   . GERD (gastroesophageal reflux disease)   . Hypertension   . Osteoarthritis    knees  . Rheumatoid arthritis ([place])   . Thyroid nodule     There are no active problems to display for this patient.   Past Surgical History:  Procedure Laterality Date  . ABDOMINAL HYSTERECTOMY    . adenomatous colon polyps  08/25/2016  . CHOLECYSTECTOMY    . TONSILLECTOMY      Prior to Admission medications   Medication Sig Start Date End Date Taking? Authorizing Provider  aspirin EC 81 MG tablet Take 81 mg by mouth daily.    [provider]  gabapentin (NEURONTIN) 100 MG capsule Take 100 mg by mouth 2 (two) times daily.    [provider]  lansoprazole (PREVACID SOLUTAB) 30 MG disintegrating tablet Take 30 mg by mouth 2 (two) times  daily.     [provider]  lisinopril (PRINIVIL,ZESTRIL) 10 MG tablet Take 10 mg by mouth daily. Take 2 tabs daily    [provider]  meclizine (ANTIVERT) 12.5 MG tablet Take 12.5 mg by mouth 3 (three) times daily as needed for dizziness.    [provider]  metFORMIN (GLUCOPHAGE) 500 MG tablet Take 500 mg by mouth. Take 2 tabs in am, 1 tab in pm    [provider]  methotrexate (RHEUMATREX) 2.5 MG tablet Take 7.5 mg by mouth once a week.    [provider]  Jonetta Speak LANCETS 56O MISC 3 daily    [provider]  pantoprazole (PROTONIX) 20 MG tablet Take 20 mg by mouth daily.    [provider]    Allergies Erythromycin ethylsuccinate [erythromycin]  History reviewed. No pertinent family history.  Social History Social History   Tobacco Use  . Smoking status: Never Smoker  . Smokeless tobacco: Never Used  Substance Use Topics  . Alcohol use: No  . Drug use: No    Review of Systems  Constitutional: No fever/chills Eyes: No visual changes. ENT: No sore throat. Cardiovascular: As above Respiratory: As above Gastrointestinal: No abdominal pain.  No nausea, no vomiting.  No diarrhea.  No constipation. Genitourinary: Negative for dysuria. Musculoskeletal: Negative for back pain. Skin: Negative for rash.  Neurological: Negative for headaches, focal weakness or numbness.   ____________________________________________   PHYSICAL EXAM:  VITAL SIGNS: ED Triage Vitals  Enc Vitals Group     BP 10/23/17 0814 128/73     Pulse Rate 10/23/17 0814 (!) 112     Resp 10/23/17 0814 17     Temp 10/23/17 0814 98.5 F (36.9 C)     Temp Source 10/23/17 0814 Oral     SpO2 10/23/17 0814 99 %     Weight 10/23/17 0811 120 lb (54.4 kg)     Height 10/23/17 0811 5\' 1"  (1.549 m)     Head Circumference --      Peak Flow --      Pain Score 10/23/17 0810 10     Pain Loc --      Pain Edu? --      Excl. in [place]? --      Constitutional: Alert and oriented. Well appearing and in no acute distress. Eyes: Conjunctivae are normal.  Head: Atraumatic. Nose: No congestion/rhinnorhea. Mouth/Throat: Mucous membranes are moist.  Neck: No stridor.   Cardiovascular: Tachycardic, regular rhythm. Grossly normal heart sounds.   Respiratory: Normal respiratory effort.  No retractions. Lungs CTAB. Gastrointestinal: Soft and nontender. No distention.  Rectal exam with external hemorrhoids which are non-engorged.  Digital exam with grossly brown stool.  Very mildly positive Hemoccult. Musculoskeletal: No lower extremity tenderness nor edema.  No joint effusions. Neurologic:  Normal speech and language. No gross focal neurologic deficits are appreciated. Skin:  Skin is warm, dry and intact. No rash noted. Psychiatric: Mood and affect are normal. Speech and behavior are normal.  ____________________________________________   LABS (all labs ordered are listed, but only abnormal results are displayed)  Labs Reviewed  BASIC METABOLIC PANEL - Abnormal; Notable for the following components:      Result Value   Potassium 2.5 (*)    Glucose, Bld 142 (*)    BUN 22 (*)    Calcium 6.9 (*)    GFR calc non Af Amer 55 (*)    All other components within normal limits  CBC - Abnormal; Notable for the following components:   WBC 1.3 (*)    RBC 1.92 (*)    Hemoglobin 6.2 (*)    HCT 18.3 (*)    RDW 18.6 (*)    All other components within normal limits  TROPONIN I - Abnormal; Notable for the following components:   Troponin I 0.20 (*)    All other components within normal limits   ____________________________________________  EKG  ED ECG REPORT I, Doran Stabler, the attending physician, personally viewed and interpreted this ECG.   Date: 10/23/2017  EKG Time: 0813  Rate: 110  Rhythm: sinus tachycardia  Axis: normal  Intervals:none  ST&T Change: T wave inversions in 1, 2, aVL, 3 and aVF as well as ST  depressions in those same leads with about 8.5-1 mm depression in V4 through 6.  No previous EKGs on the record for comparison.  ____________________________________________  RADIOLOGY  Mild interstitial edema with associated small bilateral pleural effusions.  Atherosclerosis ____________________________________________   PROCEDURES  Procedure(s) performed:   Procedures  Critical Care performed:   ____________________________________________   INITIAL IMPRESSION / ASSESSMENT AND PLAN / ED COURSE  Pertinent labs & imaging results that were available during my care of the patient were reviewed by me and considered in my medical decision making (see chart for details).  Differential diagnosis includes, but is not limited to, ACS,  aortic dissection, pulmonary embolism, cardiac tamponade, pneumothorax, pneumonia, pericarditis, myocarditis, GI-related causes including esophagitis/gastritis, and musculoskeletal chest wall pain.    As part of my medical decision making, I reviewed the following data within the [place] chart reviewed  ----------------------------------------- 10:00 AM on 10/23/2017 -----------------------------------------  Patient will require admission to the hospital.  Found to have low platelets as well as low hemoglobin with leukopenia.  Unclear cause at this time.  Less likely to be methotrexate toxicity secondary to abnormal renal function.  Heme negative.  Signed out to Dr. Tressia Miners.          ____________________________________________   FINAL CLINICAL IMPRESSION(S) / ED DIAGNOSES  Chest pain.  Symptomatic anemia.    NEW MEDICATIONS STARTED DURING THIS VISIT:  This SmartLink is deprecated. Use AVSMEDLIST instead to display the medication list for a patient.   Note:  This document was prepared using Dragon voice recognition software and may include unintentional dictation errors.     Orbie Pyo,  MD 10/23/17 1002

## 2017-10-23 NOTE — Consult Note (Signed)
Cardiology Consult    Patient ID: ASTER SCREWS MRN: [id number], DOB/AGE: 79/10/1936   Admit date: 10/23/2017 Date of Consult: 10/23/2017  Primary Physician: Leonel Ramsay, MD Primary Cardiologist: New - seen by Johnny Bridge, MD  Requesting Provider: Q. Bridgett Larsson, MD  Patient Profile    Lisa Hernandez is a 81 y.o. female with a history of DM, HTN, RA, GERD, esoph stricture, and OA, who is being seen today for the evaluation of chest pain and elevated troponin in the setting of profound anemia at the request of Dr. Bridgett Larsson.  Past Medical History   Past Medical History:  Diagnosis Date  . Diabetes mellitus without complication ([place])   . Dysphagia   . Esophageal stricture    a. s/p dilation x 7; b. 10/14/2017 EGD: benign appearing esophageal stenosis (severe) w/ adherent scar tissue in distal esophagus.  Marland Kitchen GERD (gastroesophageal reflux disease)   . Hypertension   . Osteoarthritis    knees  . Rheumatoid arthritis ([place])   . Thyroid nodule     Past Surgical History:  Procedure Laterality Date  . ABDOMINAL HYSTERECTOMY    . adenomatous colon polyps  08/25/2016  . CHOLECYSTECTOMY    . TONSILLECTOMY       Allergies  Allergies  Allergen Reactions  . Erythromycin Ethylsuccinate [Erythromycin] Other (See Comments)    GI upset    History of Present Illness    81 y/o ? with the above complex PMH including HTN, DM, RA, GERD, esoph stricture, and OA.  She has not prior cardiac history.  She lives in [place] by herself and is active but does not routinely exercise.  She was in her usoh until ~ 2 wks ago, when she began to experience intermittent rest and exertional sscp described as a "numb" feeling in her chest radiating to her left arm, associated with dyspnea, lasting 10-15 mins, and resolving spontaneously.  Ss occur up to 2-3x/day, every day over the past 2 wks.  She underwent EGD on 10/31 which showed esophageal stenosis.  She is scheduled for dilation later this month.  She notes that Ss  over the past two weeks are different from prior esophageal stricture Ss.  This AM @ ~ 4 AM, she awoke with recurrent chest discomfort radiating down her left arm (paresthesias) and associated with dyspnea.  Ss persisted and she waited until it was light out to call her sister, who doesn't drive at night.  Her sister picked her up this AM and brought her [facility] ED.  There, ECG showed sinus tach w/ inflat ST dep and poor R progression.  H/H 6.2/18.3 w/ WBC of 1.3 and PLTs 27 (8/1 - 11.3/32.8, 4.1, 166).  Troponin is elevated @ 0.20.  K low @ 2.5.  We've been asked to eval.  She is currently c/p free.  Inpatient Medications    . aspirin  325 mg Oral Daily  . gabapentin  100 mg Oral BID  . insulin aspart  0-5 Units Subcutaneous QHS  . insulin aspart  0-9 Units Subcutaneous TID WC  . sodium chloride flush  3 mL Intravenous Q12H    Family History    Family History  Problem Relation Age of Onset  . Diabetes Mother   . Cancer Mother        died in her 34s  . Diabetes Father   . Heart attack Father        died @ 47  . Diabetes Sister  Social History    Social History   Socioeconomic History  . Marital status: Divorced    Spouse name: Not on file  . Number of children: Not on file  . Years of education: Not on file  . Highest education level: Not on file  Social Needs  . Financial resource strain: Not on file  . Food insecurity - worry: Not on file  . Food insecurity - inability: Not on file  . Transportation needs - medical: Not on file  . Transportation needs - non-medical: Not on file  Occupational History  . Occupation: retired  Tobacco Use  . Smoking status: Never Smoker  . Smokeless tobacco: Never Used  Substance and Sexual Activity  . Alcohol use: No  . Drug use: No  . Sexual activity: No  Other Topics Concern  . Not on file  Social History Narrative   Lives in [place] by herself.  Sister nearby.  Active.     Review of Systems    General:  +++ fatigue x  2 wks.  No chills, fever, night sweats or weight changes.  Cardiovascular:  +++ chest pain/"numbness", +++ dyspnea on exertion, no edema, orthopnea, palpitations, paroxysmal nocturnal dyspnea. Dermatological: No rash, lesions/masses Respiratory: No cough, +++ dyspnea Urologic: No hematuria, dysuria Abdominal:   +++ dysphagia/GERD.  No nausea, vomiting, diarrhea, bright red blood per rectum, melena, or hematemesis Neurologic:  No visual changes, wkns, changes in mental status. All other systems reviewed and are otherwise negative except as noted above.  Physical Exam    Blood pressure 134/81, pulse 87, temperature 98.4 F (36.9 C), temperature source Oral, resp. rate 18, height 5\' 1"  (1.549 m), weight 119 lb 1.6 oz (54 kg), SpO2 100 %.  General: Pleasant, NAD Psych: Normal affect. Neuro: Alert and oriented X 3. Moves all extremities spontaneously. HEENT: Normal  Neck: Supple without bruits or JVD. Lungs:  Resp regular and unlabored, bibasilar crackles. Heart: RRR no s3, s4, or murmurs. Abdomen: Soft, non-tender, non-distended, BS + x 4.  Extremities: No clubbing, cyanosis or edema. DP/PT/Radials 2+ and equal bilaterally.  Labs    Recent Labs    10/23/17 0843  TROPONINI 0.20*   Lab Results  Component Value Date   WBC 1.3 (LL) 10/23/2017   HGB 6.2 (L) 10/23/2017   HCT 18.3 (L) 10/23/2017   MCV 95.3 10/23/2017   PLT 27 (LL) 10/23/2017    Recent Labs  Lab 10/23/17 0843  NA 136  K 2.5*  CL 104  CO2 22  BUN 22*  CREATININE 0.96  CALCIUM 6.9*  GLUCOSE 142*     Radiology Studies    Dg Chest 2 View  Result Date: 10/23/2017 CLINICAL DATA:  Chest pain beginning at 4 a.m. this morning. Left arm numbness. EXAM: CHEST  2 VIEW COMPARISON:  None. FINDINGS: The patient has small bilateral pleural effusions. There is mild interstitial edema. Mild atelectasis in the lung bases noted. No pneumothorax. Heart size is normal. Aortic atherosclerosis is seen. IMPRESSION: Mild  interstitial edema with associated small bilateral pleural effusions. Atherosclerosis. Electronically Signed   By: Inge Rise M.D.   On: 10/23/2017 08:42    ECG & Cardiac Imaging    Sinus tachycardia, 110, 1st deg avb, inflat ST dep, poor R progression, PVC  Assessment & Plan    1.  Pancytopenia:  Pt presented to the ED with a 2 wk h/o progressive rest and exertional sscp and dyspnea with nocturnal Ss overnight that persisted several hours.  Found to be  profoundly pancytopenic (6.2/18.3 w/ WBC of 1.3 and PLTs 27; was 11.3/32.8, 4.1, 166 on 07/15/17).  She denies melena/BRBPR/hematuria.  Hematology eval pending.  She is on weekly methotrexate 7.5mg  in the setting of RA - currently on hold.  2.  Unstable Angina:  As above, 2 wk h/o progressive rest and exertional sscp and dyspnea with nocturnal Ss overnight, lasting several hrs.  ECG with inferolateral ischemic changes and initial troponin 0.20.  CXR also w/ aortic atherosclerosis.  Cont to cycle Troponin.  I suspect Ss are likely to be demand mediated in the setting of pancytopenia as outlined above.  B/c of pancytopenia, would avoid any further ASA.  No heparin.  Will add low dose  blocker given tachycardia and statin (LDL 65 in May/2017).  F/u echo to assess EF and wall motion.  Can consider further ischemic eval pending troponin trend and pancytopenia w/u.  3.  Acute CHF:  Interstitial edema on CXR.  Crackles on exam.  Recent DOE.  Echo pending.  Will give 1 dose of IV lasix.    4.  Essential HTN:  On lisinopril @ home.  Currently not ordered.  Will add  blocker in setting of above and if stable, plan to resume acei.  5.  DM II:  Per IM.  6.  RA:  Methotrexate on hold in setting of pancytopenia.  7.  Hypokalemia/Hypomagnesemia:  IV supplementation ordered for both.  Signed, Murray Hodgkins, NP 10/23/2017, 1:38 PM  For questions or updates, please contact   Please consult [email or web address] for contact info under  Cardiology/STEMI.

## 2017-10-23 NOTE — ED Notes (Signed)
Date and time results received: 10/23/17 0843 (use smartphrase ".now" to insert current time)  Test: Platelet Critical Value: 27  Name of Provider Notified: Dr. Dineen Kid  Orders Received? Or Actions Taken?: provider notified

## 2017-10-23 NOTE — ED Notes (Signed)
Nurse called report to Serenity RN

## 2017-10-23 NOTE — Consult Note (Signed)
Hematology/Oncology Consult note [facility] Telephone:([phone] [phone]  Patient Care Team: Leonel Ramsay, MD as PCP - General (Infectious Diseases)   Name of the patient: Lisa Hernandez  [id number]  1936-04-09   Date of visit: 10/23/17 REASON FOR COSULTATION:  Pancytopenia History of presenting illness- This is a 81 year old female with past medical history listed as below including rheumatoid arthritis on chronic methotrexate presented to emergency room for evaluation of chest pain. She also feels shortness of breath for about a week. In the emergency room she was found to be tachycardic, and was ST abnormality, and pancytopenic with WBC 1.3, platelet 25,000, hemoglobin 6.2, troponin at 0.2. Patient has lab with [place], which showed that on 07/15/2017, and her white count is 4.1, hemoglobin 11.3, platelet count 166,000.  Patient denies any fever or chills. She has a history of esophageal stricture and she is status post a data dictation last week. She denies any new medications except atenolol was added to her management is recently. Denies any herbal medication or supplementation. She denies drinking any alcohol. She has some weight loss because of not being able to eat well due to the esophageal stricture.   Review of systems- Review of Systems  Constitutional: Positive for weight loss. Negative for chills and fever.  HENT: Negative for sore throat.   Eyes: Negative for blurred vision.  Respiratory: Negative for cough.   Cardiovascular: Positive for chest pain.  Gastrointestinal: Negative for heartburn and vomiting.  Genitourinary: Negative for dysuria.  Musculoskeletal: Negative for myalgias.  Skin: Negative for rash.  Neurological: Positive for weakness. Negative for dizziness.  Endo/Heme/Allergies: Bruises/bleeds easily.  Psychiatric/Behavioral: Negative for suicidal ideas.    Allergies  Allergen Reactions  .  Erythromycin Ethylsuccinate [Erythromycin] Other (See Comments)    GI upset    Patient Active Problem List   Diagnosis Date Noted  . Anemia 10/23/2017     Past Medical History:  Diagnosis Date  . Diabetes mellitus without complication ([place])   . Dysphagia   . Esophageal stricture    a. s/p dilation x 7; b. 10/14/2017 EGD: benign appearing esophageal stenosis (severe) w/ adherent scar tissue in distal esophagus.  Marland Kitchen GERD (gastroesophageal reflux disease)   . Hypertension   . Osteoarthritis    knees  . Rheumatoid arthritis ([place])   . Thyroid nodule      Past Surgical History:  Procedure Laterality Date  . ABDOMINAL HYSTERECTOMY    . adenomatous colon polyps  08/25/2016  . CHOLECYSTECTOMY    . TONSILLECTOMY      Social History   Socioeconomic History  . Marital status: Divorced    Spouse name: Not on file  . Number of children: Not on file  . Years of education: Not on file  . Highest education level: Not on file  Social Needs  . Financial resource strain: Not on file  . Food insecurity - worry: Not on file  . Food insecurity - inability: Not on file  . Transportation needs - medical: Not on file  . Transportation needs - non-medical: Not on file  Occupational History  . Occupation: retired  Tobacco Use  . Smoking status: Never Smoker  . Smokeless tobacco: Never Used  Substance and Sexual Activity  . Alcohol use: No  . Drug use: No  . Sexual activity: No  Other Topics Concern  . Not on file  Social History Narrative   Lives in [place] by herself.  Sister nearby.  Active.  Family History  Problem Relation Age of Onset  . Diabetes Mother   . Cancer Mother        died in her 36s  . Diabetes Father   . Heart attack Father        died @ 27  . Diabetes Sister      Current Facility-Administered Medications:  .  0.9 %  sodium chloride infusion, 250 mL, Intravenous, PRN, Demetrios Loll, MD .  0.9 %  sodium chloride infusion, , Intravenous, Once, Demetrios Loll,  MD .  0.9 % NaCl with KCl 40 mEq / L  infusion, , Intravenous, Continuous, Demetrios Loll, MD, Last Rate: 75 mL/hr at 10/23/17 1435, 75 mL/hr at 10/23/17 1435 .  acetaminophen (TYLENOL) tablet 650 mg, 650 mg, Oral, Q6H PRN **OR** acetaminophen (TYLENOL) suppository 650 mg, 650 mg, Rectal, Q6H PRN, Demetrios Loll, MD .  albuterol (PROVENTIL) (2.5 MG/3ML) 0.083% nebulizer solution 2.5 mg, 2.5 mg, Nebulization, Q2H PRN, Demetrios Loll, MD .  atorvastatin (LIPITOR) tablet 10 mg, 10 mg, Oral, q1800, Rogelia Mire, NP .  bisacodyl (DULCOLAX) EC tablet 5 mg, 5 mg, Oral, Daily PRN, Demetrios Loll, MD .  Derrill Memo ON 10/24/2017] feeding supplement (ENSURE ENLIVE) (ENSURE ENLIVE) liquid 237 mL, 237 mL, Oral, BID BM, Demetrios Loll, MD .  furosemide (LASIX) injection 20 mg, 20 mg, Intravenous, BID, Demetrios Loll, MD .  gabapentin (NEURONTIN) capsule 100 mg, 100 mg, Oral, BID, Demetrios Loll, MD, 100 mg at 10/23/17 1401 .  HYDROcodone-acetaminophen (NORCO/VICODIN) 5-325 MG per tablet 1-2 tablet, 1-2 tablet, Oral, Q4H PRN, Demetrios Loll, MD .  insulin aspart (novoLOG) injection 0-5 Units, 0-5 Units, Subcutaneous, QHS, Demetrios Loll, MD .  insulin aspart (novoLOG) injection 0-9 Units, 0-9 Units, Subcutaneous, TID WC, Demetrios Loll, MD .  magnesium sulfate IVPB 4 g 100 mL, 4 g, Intravenous, Once, Demetrios Loll, MD .  meclizine (ANTIVERT) tablet 12.5 mg, 12.5 mg, Oral, TID PRN, Demetrios Loll, MD .  metoprolol tartrate (LOPRESSOR) tablet 12.5 mg, 12.5 mg, Oral, BID, Murray Hodgkins R, NP, 12.5 mg at 10/23/17 1429 .  multivitamin liquid 15 mL, 15 mL, Oral, Daily, Demetrios Loll, MD .  ondansetron [facility]) tablet 4 mg, 4 mg, Oral, Q6H PRN **OR** ondansetron (ZOFRAN) injection 4 mg, 4 mg, Intravenous, Q6H PRN, Demetrios Loll, MD .  potassium chloride SA (K-DUR,KLOR-CON) CR tablet 40 mEq, 40 mEq, Oral, Once, Demetrios Loll, MD .  senna-docusate (Senokot-S) tablet 1 tablet, 1 tablet, Oral, QHS PRN, Demetrios Loll, MD .  sodium chloride flush (NS) 0.9 % injection 3 mL,  3 mL, Intravenous, Q12H, Demetrios Loll, MD .  sodium chloride flush (NS) 0.9 % injection 3 mL, 3 mL, Intravenous, PRN, Demetrios Loll, MD   Physical exam:  Vitals:   10/23/17 1215 10/23/17 1229 10/23/17 1255 10/23/17 1634  BP:   134/81 127/63  Pulse: 82 88 87 82  Resp: 19 (!) 21 18   Temp:   98.4 F (36.9 C) 98.2 F (36.8 C)  TempSrc:   Oral Oral  SpO2: 93% 93% 100% 100%  Weight:   119 lb 1.6 oz (54 kg)   Height:   '5\' 1"'  (1.549 m)    GENERAL:Alert, no distress and comfortable. +Pallor EYES: not icteric  OROPHARYNX: no thrush or ulceration; good dentition  NECK: supple, no masses felt LYMPH:  no palpable lymphadenopathy in the cervical, axillary or inguinal regions LUNGS: clear to auscultation and  No wheeze or crackles HEART/CVS: regular rate & rhythm and no murmurs; No lower extremity edema  ABDOMEN: abdomen soft, non-tender and normal bowel sounds Musculoskeletal:no cyanosis of digits and no clubbing  PSYCH: alert & oriented x 3  NEURO: no focal motor/sensory deficits SKIN:  no rashes or significant lesions     CMP Latest Ref Rng & Units 10/23/2017  Glucose 65 - 99 mg/dL 142(H)  BUN 6 - 20 mg/dL 22(H)  Creatinine 0.44 - 1.00 mg/dL 0.96  Sodium 135 - 145 mmol/L 136  Potassium 3.5 - 5.1 mmol/L 2.5(LL)  Chloride 101 - 111 mmol/L 104  CO2 22 - 32 mmol/L 22  Calcium 8.9 - 10.3 mg/dL 6.9(L)   CBC Latest Ref Rng & Units 10/23/2017  WBC 3.6 - 11.0 K/uL 1.3(LL)  Hemoglobin 12.0 - 16.0 g/dL 6.2(L)  Hematocrit 35.0 - 47.0 % 18.3(L)  Platelets 150 - 440 K/uL 27(LL)      Dg Chest 2 View  Result Date: 10/23/2017 CLINICAL DATA:  Chest pain beginning at 4 a.m. this morning. Left arm numbness. EXAM: CHEST  2 VIEW COMPARISON:  None. FINDINGS: The patient has small bilateral pleural effusions. There is mild interstitial edema. Mild atelectasis in the lung bases noted. No pneumothorax. Heart size is normal. Aortic atherosclerosis is seen. IMPRESSION: Mild interstitial edema with associated  small bilateral pleural effusions. Atherosclerosis. Electronically Signed   By: Inge Rise M.D.   On: 10/23/2017 08:42    Assessment and plan- Patient is a 81 y.o. female who is on low-dose methotrexate chronically presented with pancytopenia, chest pain and shortness of breath.  # pancytopenia: STAT CBC with differential, and pathologist smear review to evaluate if any blasts, schistocytes, etc. Check LDH, uric acid, folate and B12 level. Agree with PRBC transfusion to keep hemoglobin above 7.  Transfuse platelet if <10,000.  Possible bone marrow biopsy if above test non-conclusive. Explained the procedure and patient is open to the procedure if needed.  # Chest pain and EKG changes, likely demand mismatch. Appreciate cardiology input.   Thank you for this kind referral and the opportunity to participate in the care of this patient. Will continue to follow along her inpatient course.   Dr. Earlie Server, MD, PhD [facility] at [facility] Pager- [phone] 10/23/2017

## 2017-10-23 NOTE — H&P (Addendum)
[place] [facility] [place] NAME: Lisa Hernandez    MR#:  [id number]  DATE OF BIRTH:  [birth year]  DATE OF ADMISSION:  10/23/2017  PRIMARY CARE PHYSICIAN: Leonel Ramsay, MD   REQUESTING/REFERRING PHYSICIAN: Orbie Pyo, MD  CHIEF COMPLAINT:   Chief Complaint  Patient presents with  . Chest Pain   Chest pain and shortness of breath. HISTORY OF PRESENT ILLNESS:  Lisa Hernandez  is a 81 y.o. female with a known history of hypertension, diabetes, GERD, esophageal stricture, dysphagia, and the rheumatoid arthritis.  The patient presents the ED with above chief complaint.  The patient has had chest pain and shortness of breath for 1 week, but worsening today.  The chest pain is seen substernal area, tingling, 6 out of 10 without radiation.  The patient denies any nausea, vomiting, palpitation or diaphoresis.  She denies any orthopnea or nocturnal dyspnea or leg edema.  She denies any recent travel history.  Living alone herself at home.  She is found anemia with hemoglobin at 6.2, WBC 1.3, potassium at 2.5, troponin is elevated at 0.2.  Chest x-ray show mild pulmonary edema.  PAST MEDICAL HISTORY:   Past Medical History:  Diagnosis Date  . Diabetes mellitus without complication ([place])   . Dysphagia   . Esophageal stricture   . GERD (gastroesophageal reflux disease)   . Hypertension   . Osteoarthritis    knees  . Rheumatoid arthritis ([place])   . Thyroid nodule     PAST SURGICAL HISTORY:   Past Surgical History:  Procedure Laterality Date  . ABDOMINAL HYSTERECTOMY    . adenomatous colon polyps  08/25/2016  . CHOLECYSTECTOMY    . TONSILLECTOMY      SOCIAL HISTORY:   Social History   Tobacco Use  . Smoking status: Never Smoker  . Smokeless tobacco: Never Used  Substance Use Topics  . Alcohol use: No    FAMILY HISTORY:   Family History  Problem Relation Age of Onset  . Diabetes Mother   . Diabetes Father   . Diabetes  Sister     DRUG ALLERGIES:   Allergies  Allergen Reactions  . Erythromycin Ethylsuccinate [Erythromycin] Other (See Comments)    GI upset    REVIEW OF SYSTEMS:   Review of Systems  Constitutional: Positive for malaise/fatigue and weight loss. Negative for chills and fever.  HENT: Negative for sore throat.   Eyes: Negative for blurred vision and double vision.  Respiratory: Positive for shortness of breath. Negative for cough, hemoptysis, wheezing and stridor.   Cardiovascular: Positive for chest pain. Negative for palpitations, orthopnea and leg swelling.  Gastrointestinal: Negative for abdominal pain, blood in stool, diarrhea, melena, nausea and vomiting.  Genitourinary: Negative for dysuria, flank pain and hematuria.  Musculoskeletal: Negative for back pain and joint pain.  Skin: Negative for rash.  Neurological: Positive for weakness. Negative for dizziness, sensory change, focal weakness, seizures, loss of consciousness and headaches.  Endo/Heme/Allergies: Negative for polydipsia.  Psychiatric/Behavioral: Negative for depression. The patient is not nervous/anxious.     MEDICATIONS AT HOME:   Prior to Admission medications   Medication Sig Start Date End Date Taking? Authorizing Provider  aspirin EC 81 MG tablet Take 81 mg by mouth daily.   Yes [provider]  atenolol (TENORMIN) 25 MG tablet Take 1 tablet 2 (two) times daily by mouth. 10/09/17 10/09/18 Yes [provider]  gabapentin (NEURONTIN) 100 MG capsule Take 100 mg by mouth 2 (  two) times daily.   Yes [provider]  metFORMIN (GLUCOPHAGE) 500 MG tablet Take 500 mg by mouth. Take 2 tabs in am, 1 tab in pm   Yes [provider]  pantoprazole (PROTONIX) 20 MG tablet Take 20 mg by mouth daily.   Yes [provider]  lisinopril (PRINIVIL,ZESTRIL) 10 MG tablet Take 10 mg 2 (two) times daily by mouth.     [provider]  meclizine (ANTIVERT) 12.5 MG tablet Take 12.5 mg  by mouth 3 (three) times daily as needed for dizziness.    [provider]  methotrexate (RHEUMATREX) 2.5 MG tablet Take 7.5 mg by mouth once a week.    [provider]  [facility] DELICA LANCETS 62G MISC 3 daily    [provider]      VITAL SIGNS:  Blood pressure 138/71, pulse 83, temperature 98.4 F (36.9 C), temperature source Oral, resp. rate (!) 29, height 5\' 1"  (1.549 m), weight 120 lb (54.4 kg), SpO2 92 %.  PHYSICAL EXAMINATION:  Physical Exam  GENERAL:  81 y.o.-year-old patient lying in the bed with no acute distress.  Fragile looking, malnutrition. EYES: Pupils equal, round, reactive to light and accommodation. No scleral icterus. Extraocular muscles intact.  Pale conjunctivae. HEENT: Head atraumatic, normocephalic. Oropharynx and nasopharynx clear.  NECK:  Supple, no jugular venous distention. No thyroid enlargement, no tenderness.  LUNGS: Normal breath sounds bilaterally, no wheezing, mild rales, no rhonchi or crepitation. No use of accessory muscles of respiration.  CARDIOVASCULAR: S1, S2 normal. No murmurs, rubs, or gallops.  ABDOMEN: Soft, nontender, nondistended. Bowel sounds present. No organomegaly or mass.  EXTREMITIES: No pedal edema, cyanosis, or clubbing.  No calf tenderness. NEUROLOGIC: Cranial nerves II through XII are intact. Muscle strength 4/5 in all extremities. Sensation intact. Gait not checked.  PSYCHIATRIC: The patient is alert and oriented x 3.  SKIN: No obvious rash, lesion, or ulcer.   LABORATORY PANEL:   CBC Recent Labs  Lab 10/23/17 0843  WBC 1.3*  HGB 6.2*  HCT 18.3*  PLT 27*   ------------------------------------------------------------------------------------------------------------------  Chemistries  Recent Labs  Lab 10/23/17 0843 10/23/17 0852  NA 136  --   K 2.5*  --   CL 104  --   CO2 22  --   GLUCOSE 142*  --   BUN 22*  --   CREATININE 0.96  --   CALCIUM 6.9*  --   MG  --  1.0*    ------------------------------------------------------------------------------------------------------------------  Cardiac Enzymes Recent Labs  Lab 10/23/17 0843  TROPONINI 0.20*   ------------------------------------------------------------------------------------------------------------------  RADIOLOGY:  Dg Chest 2 View  Result Date: 10/23/2017 CLINICAL DATA:  Chest pain beginning at 4 a.m. this morning. Left arm numbness. EXAM: CHEST  2 VIEW COMPARISON:  None. FINDINGS: The patient has small bilateral pleural effusions. There is mild interstitial edema. Mild atelectasis in the lung bases noted. No pneumothorax. Heart size is normal. Aortic atherosclerosis is seen. IMPRESSION: Mild interstitial edema with associated small bilateral pleural effusions. Atherosclerosis. Electronically Signed   By: Inge Rise M.D.   On: 10/23/2017 08:42      IMPRESSION AND PLAN:   Symptomatic anemia. The patient will be admitted to medical floor. PRBC transfusion 2 unit.  Follow-up hemoglobin in am. Stool occult is negative.  Anemia workup.  Pancytopenia.  Hold methotrexate, hematologist consult.  Follow-up CBC.  Acute CHF.  Unnown type. Start Lasix, follow-up echo and cardiology consult.  Chest pain and shortness of breath with elevated troponin.  Possible due  to demand ischemia.  Follow-up troponin level and lipid panel. Given aspirin p.o.  Cardiology consult.  Severe hypokalemia.  Give IV potassium, follow-up potassium level and magnesium level. Hypomagnesemia.  Give IV magnesium in the follow-up level.  Hypertension.  Hold hypertension medication due to low side blood pressure. Diabetes.  Start sliding scale.  Hold metformin. Malnutrition.  Dietitian consult. Generalized weakness.  PT evaluation.  All the records are reviewed and case discussed with ED provider. Management plans discussed with the patient, family and they are in agreement.  CODE STATUS: Full code  TOTAL  TIME TAKING CARE OF THIS PATIENT: 57 minutes.    Demetrios Loll M.D on 10/23/2017 at 12:16 PM  Between 7am to 6pm - Pager - [phone]  After 6pm go to [email or web address] - Proofreader  [facility]  Office  [phone]  CC: Primary care physician; Leonel Ramsay, MD   Note: This dictation was prepared with Dragon dictation along with smaller phrase technology. Any transcriptional errors that result from this process are unin

## 2017-10-23 NOTE — ED Notes (Signed)
Date and time results received: 10/23/17 0843 (use smartphrase ".now" to insert current time)  Test: Troponin Critical Value: 0.20  Name of Provider Notified: Dr. Dineen Kid  Orders Received? Or Actions Taken?: Actions Taken: Notified Provider

## 2017-10-23 NOTE — ED Notes (Signed)
Date and time results received: 10/23/17 0843 (use smartphrase ".now" to insert current time)  Test: 2.5 Critical Value: Potassium  Name of Provider Notified: Dr. Dineen Kid  Orders Received? Or Actions Taken?: Notified Provider

## 2017-10-24 DIAGNOSIS — D709 Neutropenia, unspecified: Secondary | ICD-10-CM

## 2017-10-24 DIAGNOSIS — E876 Hypokalemia: Secondary | ICD-10-CM

## 2017-10-24 DIAGNOSIS — I214 Non-ST elevation (NSTEMI) myocardial infarction: Principal | ICD-10-CM

## 2017-10-24 DIAGNOSIS — D649 Anemia, unspecified: Secondary | ICD-10-CM

## 2017-10-24 LAB — LIPID PANEL
CHOL/HDL RATIO: 3.6 ratio
Cholesterol: 109 mg/dL (ref 0–200)
HDL: 30 mg/dL — ABNORMAL LOW (ref >40)
LDL CALC: 57 mg/dL (ref 0–99)
Triglycerides: 110 mg/dL (ref <150)
VLDL: 22 mg/dL (ref 0–40)

## 2017-10-24 LAB — BASIC METABOLIC PANEL
ANION GAP: 10 (ref 5–15)
BUN: 25 mg/dL — ABNORMAL HIGH (ref 6–20)
CHLORIDE: 108 mmol/L (ref 101–111)
CO2: 19 mmol/L — AB (ref 22–32)
Calcium: 6.8 mg/dL — ABNORMAL LOW (ref 8.9–10.3)
Creatinine, Ser: 0.95 mg/dL (ref 0.44–1.00)
GFR calc non Af Amer: 55 mL/min — ABNORMAL LOW (ref >60)
GLUCOSE: 166 mg/dL — AB (ref 65–99)
Potassium: 3.9 mmol/L (ref 3.5–5.1)
Sodium: 137 mmol/L (ref 135–145)

## 2017-10-24 LAB — PHOSPHORUS: Phosphorus: 3.3 mg/dL (ref 2.5–4.6)

## 2017-10-24 LAB — CBC
HCT: 24.5 % — ABNORMAL LOW (ref 35.0–47.0)
HEMOGLOBIN: 8.4 g/dL — AB (ref 12.0–16.0)
MCH: 32.8 pg (ref 26.0–34.0)
MCHC: 34.4 g/dL (ref 32.0–36.0)
MCV: 95.5 fL (ref 80.0–100.0)
Platelets: 24 10*3/uL — CL (ref 150–440)
RBC: 2.57 MIL/uL — AB (ref 3.80–5.20)
RDW: 17 % — ABNORMAL HIGH (ref 11.5–14.5)
WBC: 1.5 10*3/uL — ABNORMAL LOW (ref 3.6–11.0)

## 2017-10-24 LAB — GLUCOSE, CAPILLARY
GLUCOSE-CAPILLARY: 164 mg/dL — AB (ref 65–99)
GLUCOSE-CAPILLARY: 124 mg/dL — AB (ref 65–99)
Glucose-Capillary: 155 mg/dL — ABNORMAL HIGH (ref 65–99)
Glucose-Capillary: 186 mg/dL — ABNORMAL HIGH (ref 65–99)

## 2017-10-24 LAB — TROPONIN I: TROPONIN I: 3.31 ng/mL — AB (ref <0.03)

## 2017-10-24 LAB — HAPTOGLOBIN: HAPTOGLOBIN: 52 mg/dL (ref 34–200)

## 2017-10-24 LAB — MAGNESIUM: MAGNESIUM: 2.3 mg/dL (ref 1.7–2.4)

## 2017-10-24 MED ORDER — FUROSEMIDE 10 MG/ML IJ SOLN
40.0000 mg | Freq: Once | INTRAMUSCULAR | Status: AC
  Administered 2017-10-24: 40 mg via INTRAVENOUS
  Filled 2017-10-24: qty 4

## 2017-10-24 MED ORDER — FUROSEMIDE 10 MG/ML IJ SOLN
20.0000 mg | Freq: Every day | INTRAMUSCULAR | Status: DC
  Administered 2017-10-25 – 2017-10-26 (×2): 20 mg via INTRAVENOUS
  Filled 2017-10-24 (×2): qty 2

## 2017-10-24 MED ORDER — CALCIUM CARBONATE-VITAMIN D 500-200 MG-UNIT PO TABS
2.0000 | ORAL_TABLET | Freq: Every day | ORAL | Status: DC
  Administered 2017-10-24 – 2017-10-28 (×5): 2 via ORAL
  Filled 2017-10-24 (×5): qty 2

## 2017-10-24 MED ORDER — FOLIC ACID 1 MG PO TABS
1.0000 mg | ORAL_TABLET | Freq: Every day | ORAL | Status: DC
  Administered 2017-10-24 – 2017-10-28 (×5): 1 mg via ORAL
  Filled 2017-10-24 (×5): qty 1

## 2017-10-24 NOTE — Progress Notes (Addendum)
Progress Note  Patient Name: Lisa Hernandez Date of Encounter: 10/24/2017  Primary Cardiologist: New to [facility] - consult by Rockey Situ  Subjective   Feels better this morning s/p pRBC. HGB up to 8.4 from 6.2. WBC remains low at 1.5 along with PLT 24. Path smear pending. LDH elevated. Hematology considering bone marrow biopsy. Echo showed severely reduced EF of 25-30% with anterior, apical, and lateral akinesis along with hypokinesis of the anteroseptal myocardium. Diuresed 2 L for the admission. BUN 25 this AM with SCr 0.95. Potassium improved to 3.9 from 2.5. Magnesium 1.0-->2.3. BP stable. No chest pain. Troponin trended up to 2.03 on 11/9. Not on heparin or ASA given pancytopenia.   Inpatient Medications    Scheduled Meds: . atorvastatin  10 mg Oral q1800  . feeding supplement (ENSURE ENLIVE)  237 mL Oral BID BM  . folic acid  1 mg Oral Daily  . [START ON 10/25/2017] furosemide  20 mg Intravenous Daily  . gabapentin  100 mg Oral BID  . insulin aspart  0-5 Units Subcutaneous QHS  . insulin aspart  0-9 Units Subcutaneous TID WC  . metoprolol tartrate  12.5 mg Oral BID  . multivitamin  15 mL Oral Daily  . sodium chloride flush  3 mL Intravenous Q12H   Continuous Infusions: . sodium chloride    . sodium chloride     PRN Meds: sodium chloride, acetaminophen **OR** acetaminophen, albuterol, bisacodyl, HYDROcodone-acetaminophen, meclizine, ondansetron **OR** ondansetron (ZOFRAN) IV, senna-docusate, sodium chloride flush   Vital Signs    Vitals:   10/24/17 0147 10/24/17 0347 10/24/17 0441 10/24/17 0838  BP:  (!) 148/77 (!) 150/85 (!) 143/76  Pulse:  81 82 75  Resp:   18 18  Temp: 98.4 F (36.9 C) 98.1 F (36.7 C) 98.2 F (36.8 C) 98.1 F (36.7 C)  TempSrc: Oral Oral Oral Oral  SpO2:  96% 94% 99%  Weight:   122 lb 9.6 oz (55.6 kg)   Height:        Intake/Output Summary (Last 24 hours) at 10/24/2017 1224 Last data filed at 10/24/2017 1101 Gross per 24 hour  Intake -    Output 1950 ml  Net -1950 ml   Filed Weights   10/23/17 0811 10/23/17 1255 10/24/17 0441  Weight: 120 lb (54.4 kg) 119 lb 1.6 oz (54 kg) 122 lb 9.6 oz (55.6 kg)    Telemetry    NSR - Personally Reviewed  ECG    n/a - Personally Reviewed  Physical Exam   GEN: Frail appearing; No acute distress.   Neck: JVD elevated ~ 10 cm. Cardiac: RRR, II/VI systolic murmur, no rubs, or gallops.  Respiratory: Clear to auscultation bilaterally.  GI: Soft, nontender, non-distended.   MS: No edema; No deformity. Neuro:  Alert and oriented x 3; Nonfocal.  Psych: Normal affect.  Labs    Chemistry Recent Labs  Lab 10/23/17 0843 10/23/17 0852 10/24/17 0506  NA 136  --  137  K 2.5*  --  3.9  CL 104  --  108  CO2 22  --  19*  GLUCOSE 142*  --  166*  BUN 22*  --  25*  CREATININE 0.96  --  0.95  CALCIUM 6.9*  --  6.8*  ALBUMIN  --  3.0*  --   GFRNONAA 55*  --  55*  GFRAA >60  --  >60  ANIONGAP 10  --  10     Hematology Recent Labs  Lab 10/23/17 0843 10/23/17  1634 10/24/17 0506  WBC 1.3* 1.5* 1.5*  RBC 1.92* 1.94* 2.57*  HGB 6.2* 6.4* 8.4*  HCT 18.3* 18.6* 24.5*  MCV 95.3 95.8 95.5  MCH 32.5 32.9 32.8  MCHC 34.1 34.3 34.4  RDW 18.6* 18.4* 17.0*  PLT 27* 31* 24*    Cardiac Enzymes Recent Labs  Lab 10/23/17 0843 10/23/17 1433 10/23/17 1928  TROPONINI 0.20* 0.96* 2.03*   No results for input(s): TROPIPOC in the last 168 hours.   BNP Recent Labs  Lab 10/23/17 1634  BNP 2,087.0*     DDimer No results for input(s): DDIMER in the last 168 hours.   Radiology    Dg Chest 2 View  Result Date: 10/23/2017 IMPRESSION: Mild interstitial edema with associated small bilateral pleural effusions. Atherosclerosis. Electronically Signed   By: Inge Rise M.D.   On: 10/23/2017 08:42    Cardiac Studies   TTE 10/23/2017: Study Conclusions  - Left ventricle: The cavity size was normal. Systolic function was   severely reduced. The estimated ejection fraction was in  the   range of 25% to 30%. Akinesis of the anterior myocardium.   Akinesis of the apical myocardium. Akinesis of the lateral   myocardium. Hypokinesis of the anteroseptal myocardium. - Mitral valve: There was moderate regurgitation. - Left atrium: The atrium was moderately dilated. - Right ventricle: Systolic function was normal. - Tricuspid valve: There was moderate regurgitation. - Pulmonary arteries: Systolic pressure was severely elevated. PA   peak pressure: 63 mm Hg (S).  Patient Profile     81 y.o. female with history of DM, HTN, RA, GERD, esophageal stricture, and OA, who is being seen today for the evaluation of chest pain and elevated troponin in the setting of profound pancytopenia.    Assessment & Plan    1. NSTEMI: -Chest pain free -Troponin trended to 2.03 on 11/9, continue to cycle until peak -Echo with WMA consistent with coronary disease  -Unable to treat with heparin or ASA given pancytopenia with PLT of 24 -Unable to perform invasive procedure such as R/LHC given pancytopenia as above -Lopressor 12.5 mg bid as BP allows -Lipitor  -As blood counts allow would pursue cardiac cath at a later date  2. Pancytopenia: -Hematology on board -Possible bone marrow biopsy  -Maintain HGB > 8.5 -Transfuse for PLT < 10  3. Acute systolic CHF/pulmonary HTN: -IV Lasix 20 mg daily -Lopressor as above, consider changing to Toprol or Coreg given cardiomyopathy -Add lisinopril as BP allows (was soft on 11/9)  4. Hypokalemia/hypomagensemia: -Improved -Maintain at goal  5. RA: -Methotrexate on hold   For questions or updates, please contact [place] Please consult [email or web address] for contact info under Cardiology/STEMI.    Signed, Christell Faith, PA-C [place] Pager: [phone] 10/24/2017, 12:24 PM   Attending Note Patient seen and examined, agree with detailed note above,  Patient presentation and plan discussed on rounds.   Sister at the  bedside Patient reports that breathing is better , still not at baseline   Denies any significant chest pain Seen by hematology Discussed recent echocardiogram findings, lab work findings with her in detail  She received blood transfusion  On physical exam she is pale, mild shortness of breath at rest Rales at the bases bilaterally otherwise clear, heart sounds regular normal K8-L2 2/6 systolic ejection murmur heard right sternal border, within abdomen soft nontender no significant lower extremity edema No JVD  Lab work reviewed showing total cholesterol 109, creatinine 0.95, sodium 137, WBC 1.5, hematocrit 24.5,  platelets 24  1) NSTEMI Currently up to more than 2  depressed ejection fraction with wall motion abnormalities suggesting anterior and lateral wall infarct Not a good candidate for anticoagulation/heparin infusion given platelets of 25 Not a good candidate for cardiac catheterization at this time as intervention would require aspirin and Plavix Agree with atorvastatin, low-dose beta-blocker such as carvedilol as blood pressure tolerates Consider low-dose aspirin 81 mg daily  2) pancytopenia Etiology unclear, oncology/hematology following Normal iron studies Anemia likely contributing to her shortness of breath, heart failure symptoms  3) cardiomyopathy, ischemic Not a good candidate for ischemic workup at this time given pancytopenia Supportive therapy at this time Lasix daily with close monitoring of renal function  4) unstable angina Likely with underlying coronary disease, multivessel  anginal symptoms on arrival in the setting of profound anemia Goal hemoglobin greater than 10 Currently without chest pain, troponin trending upwards   5) rheumatoid arthritis Methotrexate on hold  Long discussion with patient and sister at the bedside concerning cardiac findings, limitations of cardiac intervention at this time  in light of her profound anemia and low  platelets Greater than 50% was spent in counseling and coordination of care with patient Total encounter time 35 minutes or more   Signed: Esmond Plants  M.D., Ph.D. [facility]

## 2017-10-24 NOTE — Progress Notes (Signed)
Starting blood, was wrenched in blood administration, but parts of the flowsheet were missing and could not dual sign off. Blood started at 0047. [place], [place] in the room. VS taken, In room with pt 15 minutes, with no reactions.Blood ended@0347 .

## 2017-10-24 NOTE — Progress Notes (Signed)
[place] [facility] [place] NAME: Lisa Hernandez    MR#:  [id number]  DATE OF BIRTH:  [birth year]  SUBJECTIVE:  CHIEF COMPLAINT:   Chief Complaint  Patient presents with  . Chest Pain   No chest pain, better shortness of breath, generalized weakness. On O2 [place] 2 L. REVIEW OF SYSTEMS:  Review of Systems  Constitutional: Positive for malaise/fatigue. Negative for chills and fever.  HENT: Negative for sore throat.   Eyes: Negative for blurred vision and double vision.  Respiratory: Positive for shortness of breath. Negative for cough, hemoptysis, sputum production, wheezing and stridor.   Cardiovascular: Positive for leg swelling. Negative for chest pain, palpitations and orthopnea.  Gastrointestinal: Negative for abdominal pain, blood in stool, diarrhea, melena, nausea and vomiting.  Genitourinary: Negative for dysuria, flank pain and hematuria.  Musculoskeletal: Negative for back pain and joint pain.  Skin: Negative for rash.  Neurological: Positive for weakness. Negative for dizziness, sensory change, focal weakness, seizures, loss of consciousness and headaches.  Endo/Heme/Allergies: Negative for polydipsia.  Psychiatric/Behavioral: Negative for depression. The patient is not nervous/anxious.     DRUG ALLERGIES:   Allergies  Allergen Reactions  . Erythromycin Ethylsuccinate [Erythromycin] Other (See Comments)    GI upset   VITALS:  Blood pressure (!) 143/76, pulse 75, temperature 98.1 F (36.7 C), temperature source Oral, resp. rate 18, height '5\' 1"'  (1.549 m), weight 122 lb 9.6 oz (55.6 kg), SpO2 99 %. PHYSICAL EXAMINATION:  Physical Exam  Constitutional: She is oriented to person, place, and time and well-developed, well-nourished, and in no distress.  HENT:  Head: Normocephalic.  Mouth/Throat: Oropharynx is clear and moist.  Eyes: Conjunctivae and EOM are normal. Pupils are equal, round, and reactive to light. No scleral icterus.    Neck: Normal range of motion. Neck supple. No JVD present. No tracheal deviation present.  Cardiovascular: Normal rate and regular rhythm. Exam reveals no gallop.  Murmur heard. Pulmonary/Chest: Effort normal. No respiratory distress. She has no wheezes. She has rales.  Abdominal: Soft. Bowel sounds are normal. She exhibits no distension. There is no tenderness. There is no rebound.  Musculoskeletal: Normal range of motion. She exhibits edema. She exhibits no tenderness.  Neurological: She is alert and oriented to person, place, and time. No cranial nerve deficit.  Skin: No rash noted. No erythema.  Psychiatric: Affect normal.   LABORATORY PANEL:  Female CBC Recent Labs  Lab 10/24/17 0506  WBC 1.5*  HGB 8.4*  HCT 24.5*  PLT 24*   ------------------------------------------------------------------------------------------------------------------ Chemistries  Recent Labs  Lab 10/24/17 0506  NA 137  K 3.9  CL 108  CO2 19*  GLUCOSE 166*  BUN 25*  CREATININE 0.95  CALCIUM 6.8*  MG 2.3   RADIOLOGY:  No results found. ASSESSMENT AND PLAN:    Symptomatic anemia. S/p PRBC transfusion 1 unit.  Hb 8.4. Stool occult is negative.  Anemia workup: Iron level is normal.  Pancytopenia.  Hold methotrexate, per hematologist consult, need bone marrow biopsy. Follow-up CBC.  NSTEMI Troponin level up to 2.03. But per Dr. Rockey Situ, Not a good candidate for anticoagulation/heparin infusion given platelets of 25 Not a good candidate for cardiac catheterization at this time as intervention would require aspirin and Plavix. Agree with atorvastatin, low-dose beta-blocker such as carvedilol as blood pressure tolerates. Consider low-dose aspirin 81 mg daily  Acute systolic CHF, ejection fraction 25-30 %. BNP 2087. Continue IV Lasix twice daily and CHF protocol.  Severe hypokalemia.  Improved with potassium supplement.   Hypomagnesemia.    Improved with IV magnesium.  Hypertension.     Resume lisinopril and started Coreg. Diabetes.  on sliding scale.  Hold metformin. Severe malnutrition.    Follow-up recommendation by dietitian. Generalized weakness.  PT evaluation.  Discussed with Dr. Rockey Situ. All the records are reviewed and case discussed with Care Management/Social Worker. Management plans discussed with the patient, her sister and they are in agreement.  CODE STATUS: Full Code  TOTAL TIME TAKING CARE OF THIS PATIENT: 37 minutes.   More than 50% of the time was spent in counseling/coordination of care: YES  POSSIBLE D/C IN 3 DAYS, DEPENDING ON CLINICAL CONDITION.   Demetrios Loll M.D on 10/24/2017 at 1:58 PM  Between 7am to 6pm - Pager - [phone]  After 6pm go to [email or web address] - [facility]

## 2017-10-24 NOTE — Progress Notes (Signed)
Per MD, do not give second bag of RBC's. Will continue to monitor. Pt states she is feeling much better.

## 2017-10-24 NOTE — Evaluation (Signed)
Physical Therapy Evaluation Patient Details Name: Lisa Hernandez MRN: [id number] DOB: [birth year] Today's Date: 10/24/2017   History of Present Illness  Lisa Hernandez  is a 81 y.o. female with a known history of hypertension, diabetes, GERD, esophageal stricture, dysphagia, and the rheumatoid arthritis.  The patient presents the ED with chest pain.  Labs: hemoglobin at 6.2, WBC 1.3, potassium at 2.5, troponin is elevated at 0.2.  Chest x-ray show mild pulmonary edema.    Clinical Impression  Pt presents to PT at baseline functional mobility level, ambulating around nursing station without device with steady gait.  Pt reports she is able to take care of herself at home and expects to continue this way for as Lisa Hernandez as she can.  Recommend pt to continue to ambulate on unit with nursing staff to maintain strength, otherwise no further acute PT services indicated at this time.    Follow Up Recommendations No PT follow up    Equipment Recommendations  None recommended by PT    Recommendations for Other Services       Precautions / Restrictions Precautions Precautions: Fall Precaution Comments: Moderate Restrictions Weight Bearing Restrictions: No      Mobility  Bed Mobility Overal bed mobility: Modified Independent     General bed mobility comments: Some extra time to rise but overall moves with ease.  Transfers Overall transfer level: Modified independent Equipment used: None    General transfer comment: Sit<>stand from EOB and standard toilet with Mod I assist, rising on first attmept without LOB.  Ambulation/Gait Ambulation/Gait assistance: Modified independent (Device/Increase time) Ambulation Distance (Feet): 200 Feet Assistive device: None Gait Pattern/deviations: Step-through pattern     General Gait Details: Pt amb witouth device with steady gait, no frank LOB or balance corrections, pt able to negotiate objects in hallway easily, gait speed slightly diminished          Balance Overall balance assessment: Modified Independent            Pertinent Vitals/Pain Pain Assessment: No/denies pain    Home Living Family/patient expects to be discharged to:: Private residence Living Arrangements: Alone   Type of Home: House Home Access: Level entry     Home Layout: One level Home Equipment: Environmental consultant - 4 wheels      Prior Function Level of Independence: Independent     Comments: Drives and does own shopping, cooking and cleanins    Hand Dominance        Extremity/Trunk Assessment   Upper Extremity Assessment Upper Extremity Assessment: Overall WFL for tasks assessed    Lower Extremity Assessment Lower Extremity Assessment: Overall WFL for tasks assessed    Cervical / Trunk Assessment Cervical / Trunk Assessment: Normal  Communication   Communication: No difficulties  Cognition Arousal/Alertness: Awake/alert Behavior During Therapy: WFL for tasks assessed/performed Overall Cognitive Status: Within Functional Limits for tasks assessed      General Comments General comments (skin integrity, edema, etc.): bruising on fingertips from needle sticks    Exercises     Assessment/Plan    PT Assessment [place] does not need any further PT services  PT Problem List         PT Treatment Interventions      PT Goals (Current goals can be found in the Care Plan section)       Frequency     Barriers to discharge        Co-evaluation               AM-PAC  PT "6 Clicks" Daily Activity  Outcome Measure Difficulty turning over in bed (including adjusting bedclothes, sheets and blankets)?: None Difficulty moving from lying on back to sitting on the side of the bed? : None Difficulty sitting down on and standing up from a chair with arms (e.g., wheelchair, bedside commode, etc,.)?: None Help needed moving to and from a bed to chair (including a wheelchair)?: None Help needed walking in hospital room?: None Help needed climbing 3-5  steps with a railing? : None 6 Click Score: 24    End of Session Equipment Utilized During Treatment: Gait belt Activity Tolerance: Patient tolerated treatment well Patient left: in bed;with call bell/phone within reach;with family/visitor present Nurse Communication: Mobility status PT Visit Diagnosis: Muscle weakness (generalized) (M62.81)    Time: 1430-1453 PT Time Calculation (min) (ACUTE ONLY): 23 min   Charges:   PT Evaluation $PT Eval Low Complexity: 1 Low     PT G Codes:   PT G-Codes **NOT FOR INPATIENT CLASS** Functional Assessment Tool Used: AM-PAC 6 Clicks Basic Mobility Functional Limitation: Mobility: Walking and moving around Mobility: Walking and Moving Around Current Status (M4680): 0 percent impaired, limited or restricted Mobility: Walking and Moving Around Goal Status (H2122): 0 percent impaired, limited or restricted Mobility: Walking and Moving Around Discharge Status (Q8250): 0 percent impaired, limited or restricted     Cyan Clippinger A [place], PT 10/24/2017, 5:22 PM

## 2017-10-24 NOTE — Progress Notes (Signed)
Attempted to notify MD of platlelet  count of 24. No return call. Will pass to upcoming RN

## 2017-10-24 NOTE — Progress Notes (Signed)
Advanced Care Plan.  Purpose of Encounter: CODE STATUS. Parties in Attendance: The patient, her sister and me. Patient's Decisional Capacity: Yes. Medical Story: Lisa Hernandez  is a 81 y.o. female with a known history of hypertension, diabetes, GERD, esophageal stricture, dysphagia, and the rheumatoid arthritis.  She is found symptomatic anemia, non-STEMI, acute systolic CH, severe hypokalemia and pancytopenia.  She has been treated with PRBC transfusion, Lasix and potassium.  I discussed the patient's current condition and poor prognosis, discussed CODE STATUS with the patient.  The patient on full code at this time.  Plan:  Code Status: Full code for now.  Time spent discussing advance care planning: 18 minutes.

## 2017-10-25 DIAGNOSIS — E538 Deficiency of other specified B group vitamins: Secondary | ICD-10-CM

## 2017-10-25 DIAGNOSIS — D61818 Other pancytopenia: Secondary | ICD-10-CM

## 2017-10-25 LAB — CBC
HCT: 20.4 % — ABNORMAL LOW (ref 35.0–47.0)
Hemoglobin: 7 g/dL — ABNORMAL LOW (ref 12.0–16.0)
MCH: 32.7 pg (ref 26.0–34.0)
MCHC: 34.2 g/dL (ref 32.0–36.0)
MCV: 95.4 fL (ref 80.0–100.0)
Platelets: 14 K/uL — CL (ref 150–440)
RBC: 2.14 MIL/uL — ABNORMAL LOW (ref 3.80–5.20)
RDW: 17.3 % — ABNORMAL HIGH (ref 11.5–14.5)
WBC: 1.2 K/uL — CL (ref 3.6–11.0)

## 2017-10-25 LAB — BASIC METABOLIC PANEL WITH GFR
Anion gap: 6 (ref 5–15)
BUN: 22 mg/dL — ABNORMAL HIGH (ref 6–20)
CO2: 26 mmol/L (ref 22–32)
Calcium: 7.2 mg/dL — ABNORMAL LOW (ref 8.9–10.3)
Chloride: 107 mmol/L (ref 101–111)
Creatinine, Ser: 0.87 mg/dL (ref 0.44–1.00)
GFR calc Af Amer: >60 mL/min
GFR calc non Af Amer: >60 mL/min
Glucose, Bld: 125 mg/dL — ABNORMAL HIGH (ref 65–99)
Potassium: 3.1 mmol/L — ABNORMAL LOW (ref 3.5–5.1)
Sodium: 139 mmol/L (ref 135–145)

## 2017-10-25 LAB — TROPONIN I: Troponin I: 2 ng/mL (ref <0.03)

## 2017-10-25 LAB — GLUCOSE, CAPILLARY
GLUCOSE-CAPILLARY: 116 mg/dL — AB (ref 65–99)
GLUCOSE-CAPILLARY: 152 mg/dL — AB (ref 65–99)
Glucose-Capillary: 112 mg/dL — ABNORMAL HIGH (ref 65–99)
Glucose-Capillary: 146 mg/dL — ABNORMAL HIGH (ref 65–99)

## 2017-10-25 LAB — MAGNESIUM: MAGNESIUM: 1.9 mg/dL (ref 1.7–2.4)

## 2017-10-25 LAB — PREPARE RBC (CROSSMATCH)

## 2017-10-25 MED ORDER — LISINOPRIL 5 MG PO TABS
2.5000 mg | ORAL_TABLET | Freq: Every day | ORAL | Status: DC
  Administered 2017-10-25: 2.5 mg via ORAL
  Filled 2017-10-25: qty 1

## 2017-10-25 MED ORDER — SODIUM CHLORIDE 0.9 % IV SOLN
Freq: Once | INTRAVENOUS | Status: AC
  Administered 2017-10-25: 11:00:00 via INTRAVENOUS

## 2017-10-25 MED ORDER — CYANOCOBALAMIN 1000 MCG/ML IJ SOLN
1000.0000 ug | Freq: Every day | INTRAMUSCULAR | Status: DC
  Administered 2017-10-25 – 2017-10-28 (×4): 1000 ug via INTRAMUSCULAR
  Filled 2017-10-25 (×5): qty 1

## 2017-10-25 MED ORDER — CARVEDILOL 3.125 MG PO TABS
3.1250 mg | ORAL_TABLET | Freq: Two times a day (BID) | ORAL | Status: DC
  Administered 2017-10-25 – 2017-10-26 (×3): 3.125 mg via ORAL
  Filled 2017-10-25 (×3): qty 1

## 2017-10-25 MED ORDER — POTASSIUM CHLORIDE CRYS ER 20 MEQ PO TBCR
40.0000 meq | EXTENDED_RELEASE_TABLET | Freq: Two times a day (BID) | ORAL | Status: AC
  Administered 2017-10-25 (×2): 40 meq via ORAL
  Filled 2017-10-25 (×2): qty 2

## 2017-10-25 MED ORDER — LISINOPRIL 10 MG PO TABS
10.0000 mg | ORAL_TABLET | Freq: Two times a day (BID) | ORAL | Status: DC
  Administered 2017-10-25 – 2017-10-26 (×2): 10 mg via ORAL
  Filled 2017-10-25 (×2): qty 1

## 2017-10-25 MED ORDER — ACETAMINOPHEN 325 MG PO TABS
650.0000 mg | ORAL_TABLET | Freq: Once | ORAL | Status: AC
  Administered 2017-10-25: 650 mg via ORAL
  Filled 2017-10-25: qty 2

## 2017-10-25 MED ORDER — MAGNESIUM OXIDE 400 (241.3 MG) MG PO TABS
400.0000 mg | ORAL_TABLET | Freq: Every day | ORAL | Status: DC
  Administered 2017-10-25 – 2017-10-28 (×4): 400 mg via ORAL
  Filled 2017-10-25 (×4): qty 1

## 2017-10-25 NOTE — Progress Notes (Signed)
Hematology/Oncology Progress Note [facility] Telephone:([phone] [phone]  Patient Care Team: Leonel Ramsay, MD as PCP - General (Infectious Diseases)   Name of the patient: Lisa Hernandez  [id number]  03-19-1936  10/25/17   INTERVAL HISTORY Patient reports feeling weak. Denies any bleeding.  Review of systems-  Constitutional: Positive for weight loss. Negative for chills and fever.  HENT: Negative for sore throat.   Eyes: Negative for blurred vision.  Respiratory: Negative for cough.   Cardiovascular: Positive for chest pain.  Gastrointestinal: Negative for heartburn and vomiting.  Genitourinary: Negative for dysuria.  Musculoskeletal: Negative for myalgias.  Skin: Negative for rash.  Neurological: Positive for weakness. Negative for dizziness.  Endo/Heme/Allergies: Bruises/bleeds easily.  Psychiatric/Behavioral: Negative for suicidal ideas    Allergies  Allergen Reactions  . Erythromycin Ethylsuccinate [Erythromycin] Other (See Comments)    GI upset    Patient Active Problem List   Diagnosis Date Noted  . Anemia 10/23/2017  . Neutropenia ([place])   . Thrombocytopenia ([place])      Past Medical History:  Diagnosis Date  . Diabetes mellitus without complication ([place])   . Dysphagia   . Esophageal stricture    a. s/p dilation x 7; b. 10/14/2017 EGD: benign appearing esophageal stenosis (severe) w/ adherent scar tissue in distal esophagus.  Marland Kitchen GERD (gastroesophageal reflux disease)   . Hypertension   . Osteoarthritis    knees  . Rheumatoid arthritis ([place])   . Thyroid nodule      Past Surgical History:  Procedure Laterality Date  . ABDOMINAL HYSTERECTOMY    . adenomatous colon polyps  08/25/2016  . CHOLECYSTECTOMY    . TONSILLECTOMY      Social History   Socioeconomic History  . Marital status: Divorced    Spouse name: Not on file  . Number of children: Not on file  . Years of education: Not on file  . Highest  education level: Not on file  Social Needs  . Financial resource strain: Not on file  . Food insecurity - worry: Not on file  . Food insecurity - inability: Not on file  . Transportation needs - medical: Not on file  . Transportation needs - non-medical: Not on file  Occupational History  . Occupation: retired  Tobacco Use  . Smoking status: Never Smoker  . Smokeless tobacco: Never Used  Substance and Sexual Activity  . Alcohol use: No  . Drug use: No  . Sexual activity: No  Other Topics Concern  . Not on file  Social History Narrative   Lives in [place] by herself.  Sister nearby.  Active.     Family History  Problem Relation Age of Onset  . Diabetes Mother   . Cancer Mother        died in her 13s  . Diabetes Father   . Heart attack Father        died @ 11  . Diabetes Sister      Current Facility-Administered Medications:  .  0.9 %  sodium chloride infusion, 250 mL, Intravenous, PRN, Demetrios Loll, MD .  0.9 %  sodium chloride infusion, , Intravenous, Once, Demetrios Loll, MD .  acetaminophen (TYLENOL) tablet 650 mg, 650 mg, Oral, Q6H PRN **OR** acetaminophen (TYLENOL) suppository 650 mg, 650 mg, Rectal, Q6H PRN, Demetrios Loll, MD .  albuterol (PROVENTIL) (2.5 MG/3ML) 0.083% nebulizer solution 2.5 mg, 2.5 mg, Nebulization, Q2H PRN, Demetrios Loll, MD .  atorvastatin (LIPITOR) tablet 10 mg, 10 mg, Oral, q1800,  Rogelia Mire, NP, 10 mg at 10/24/17 1654 .  bisacodyl (DULCOLAX) EC tablet 5 mg, 5 mg, Oral, Daily PRN, Demetrios Loll, MD .  calcium-vitamin D (OSCAL WITH D) 500-200 MG-UNIT per tablet 2 tablet, 2 tablet, Oral, Q breakfast, Demetrios Loll, MD, 2 tablet at 10/25/17 [phone] .  carvedilol (COREG) tablet 3.125 mg, 3.125 mg, Oral, BID WC, Demetrios Loll, MD, 3.125 mg at 10/25/17 0841 .  cyanocobalamin ((VITAMIN B-12)) injection 1,000 mcg, 1,000 mcg, Intramuscular, Daily, Earlie Server, MD .  feeding supplement (ENSURE ENLIVE) (ENSURE ENLIVE) liquid 237 mL, 237 mL, Oral, BID BM, Demetrios Loll, MD, 237  mL at 10/25/17 1057 .  folic acid (FOLVITE) tablet 1 mg, 1 mg, Oral, Daily, Earlie Server, MD, 1 mg at 10/25/17 1052 .  furosemide (LASIX) injection 20 mg, 20 mg, Intravenous, Daily, Dunn, Ryan M, PA-C, 20 mg at 10/25/17 1052 .  gabapentin (NEURONTIN) capsule 100 mg, 100 mg, Oral, BID, Demetrios Loll, MD, 100 mg at 10/25/17 1052 .  HYDROcodone-acetaminophen (NORCO/VICODIN) 5-325 MG per tablet 1-2 tablet, 1-2 tablet, Oral, Q4H PRN, Demetrios Loll, MD, 1 tablet at 10/24/17 2130 .  insulin aspart (novoLOG) injection 0-5 Units, 0-5 Units, Subcutaneous, QHS, Demetrios Loll, MD .  insulin aspart (novoLOG) injection 0-9 Units, 0-9 Units, Subcutaneous, TID WC, Demetrios Loll, MD, 2 Units at 10/24/17 1653 .  lisinopril (PRINIVIL,ZESTRIL) tablet 2.5 mg, 2.5 mg, Oral, Daily, Dunn, Ryan M, PA-C, 2.5 mg at 10/25/17 1052 .  magnesium oxide (MAG-OX) tablet 400 mg, 400 mg, Oral, Daily, Dunn, Ryan M, PA-C, 400 mg at 10/25/17 1052 .  meclizine (ANTIVERT) tablet 12.5 mg, 12.5 mg, Oral, TID PRN, Demetrios Loll, MD .  multivitamin liquid 15 mL, 15 mL, Oral, Daily, Demetrios Loll, MD, 15 mL at 10/24/17 1306 .  ondansetron (ZOFRAN) tablet 4 mg, 4 mg, Oral, Q6H PRN **OR** ondansetron (ZOFRAN) injection 4 mg, 4 mg, Intravenous, Q6H PRN, Demetrios Loll, MD .  potassium chloride SA (K-DUR,KLOR-CON) CR tablet 40 mEq, 40 mEq, Oral, BID, Demetrios Loll, MD, 40 mEq at 10/25/17 1052 .  senna-docusate (Senokot-S) tablet 1 tablet, 1 tablet, Oral, QHS PRN, Demetrios Loll, MD .  sodium chloride flush (NS) 0.9 % injection 3 mL, 3 mL, Intravenous, Q12H, Demetrios Loll, MD, 3 mL at 10/25/17 1128 .  sodium chloride flush (NS) 0.9 % injection 3 mL, 3 mL, Intravenous, PRN, Demetrios Loll, MD   Physical exam:  Vitals:   10/24/17 2024 10/25/17 0410 10/25/17 0839 10/25/17 1115  BP: (!) 148/66 125/65 (!) 138/57 107/63  Pulse: 80 64 84 67  Resp: '18 18  18  ' Temp: 98.3 F (36.8 C) 98.1 F (36.7 C)  98.2 F (36.8 C)  TempSrc:  Oral  Oral  SpO2: 100% 100%    Weight:  120 lb 1.6 oz  (54.5 kg)    Height:       GENERAL:Alert, no distress and comfortable. +Pallor EYES: not icteric  OROPHARYNX: no thrush or ulceration; good dentition  NECK: supple, no masses felt LYMPH:  no palpable lymphadenopathy in the cervical, axillary or inguinal regions LUNGS: clear to auscultation and  No wheeze or crackles HEART/CVS: regular rate & rhythm and no murmurs; No lower extremity edema ABDOMEN: abdomen soft, non-tender and normal bowel sounds Musculoskeletal:no cyanosis of digits and no clubbing  PSYCH: alert & oriented x 3  NEURO: no focal motor/sensory deficits SKIN:  no rashes or significant lesions     CMP Latest Ref Rng & Units 10/25/2017  Glucose 65 - 99 mg/dL 125(H)  BUN 6 - 20 mg/dL 22(H)  Creatinine 0.44 - 1.00 mg/dL 0.87  Sodium 135 - 145 mmol/L 139  Potassium 3.5 - 5.1 mmol/L 3.1(L)  Chloride 101 - 111 mmol/L 107  CO2 22 - 32 mmol/L 26  Calcium 8.9 - 10.3 mg/dL 7.2(L)   CBC Latest Ref Rng & Units 10/25/2017  WBC 3.6 - 11.0 K/uL 1.2(LL)  Hemoglobin 12.0 - 16.0 g/dL 7.0(L)  Hematocrit 35.0 - 47.0 % 20.4(L)  Platelets 150 - 440 K/uL 14(LL)      Dg Chest 2 View  Result Date: 10/23/2017 CLINICAL DATA:  Chest pain beginning at 4 a.m. this morning. Left arm numbness. EXAM: CHEST  2 VIEW COMPARISON:  None. FINDINGS: The patient has small bilateral pleural effusions. There is mild interstitial edema. Mild atelectasis in the lung bases noted. No pneumothorax. Heart size is normal. Aortic atherosclerosis is seen. IMPRESSION: Mild interstitial edema with associated small bilateral pleural effusions. Atherosclerosis. Electronically Signed   By: Inge Rise M.D.   On: 10/23/2017 08:42    Assessment and plan- Patient is a 81 y.o. female who is on low-dose methotrexate chronically presented with pancytopenia, chest pain and shortness of breath.   # Folate deficiency : Start folic acid 74m daily.  # Vitamin B12 deficiency: start daily Vitamin B12 1000 mcg IM x 5  doses.  # pancytopenia: possible bone marrow suppression due to MTX, or due to folate/b12 deficiency. Can not rule out underlying bone marrow disorders such as MDS. Plan obtain bone marrow biopsy. Patient agrees with the plan.    PRBC transfusion to keep hemoglobin above 7.  Transfuse platelet if <10,000.   Thank you for this kind referral and the opportunity to participate in the care of this patient. Will continue to follow along her inpatient course.   ZEarlie Server MD, PhD CBrookside Surgery Centerat AUniversity Health Care SystemPager- [phone]/10/2017

## 2017-10-25 NOTE — Progress Notes (Signed)
[place] [facility] [place] NAME: Lisa Hernandez    MR#:  [id number]  DATE OF BIRTH:  09/16/1936  SUBJECTIVE:  CHIEF COMPLAINT:   Chief Complaint  Patient presents with  . Chest Pain   No chest pain, better shortness of breath, generalized weakness. On O2 [place] 2 L. REVIEW OF SYSTEMS:  Review of Systems  Constitutional: Positive for malaise/fatigue. Negative for chills and fever.  HENT: Negative for sore throat.   Eyes: Negative for blurred vision and double vision.  Respiratory: Positive for shortness of breath. Negative for cough, hemoptysis, sputum production, wheezing and stridor.   Cardiovascular: Positive for leg swelling. Negative for chest pain, palpitations and orthopnea.  Gastrointestinal: Negative for abdominal pain, blood in stool, diarrhea, melena, nausea and vomiting.  Genitourinary: Negative for dysuria, flank pain and hematuria.  Musculoskeletal: Negative for back pain and joint pain.  Skin: Negative for rash.  Neurological: Positive for weakness. Negative for dizziness, sensory change, focal weakness, seizures, loss of consciousness and headaches.  Endo/Heme/Allergies: Negative for polydipsia.  Psychiatric/Behavioral: Negative for depression. The patient is not nervous/anxious.     DRUG ALLERGIES:   Allergies  Allergen Reactions  . Erythromycin Ethylsuccinate [Erythromycin] Other (See Comments)    GI upset   VITALS:  Blood pressure (!) 105/41, pulse 72, temperature 98.2 F (36.8 C), temperature source Oral, resp. rate 20, height 5' 1" (1.549 m), weight 120 lb 1.6 oz (54.5 kg), SpO2 100 %. PHYSICAL EXAMINATION:  Physical Exam  Constitutional: She is oriented to person, place, and time and well-developed, well-nourished, and in no distress.  HENT:  Head: Normocephalic.  Mouth/Throat: Oropharynx is clear and moist.  Eyes: Conjunctivae and EOM are normal. Pupils are equal, round, and reactive to light. No scleral icterus.    Neck: Normal range of motion. Neck supple. No JVD present. No tracheal deviation present.  Cardiovascular: Normal rate and regular rhythm. Exam reveals no gallop.  Murmur heard. Pulmonary/Chest: Effort normal. No respiratory distress. She has no wheezes. She has rales.  Abdominal: Soft. Bowel sounds are normal. She exhibits no distension. There is no tenderness. There is no rebound.  Musculoskeletal: Normal range of motion. She exhibits edema. She exhibits no tenderness.  Neurological: She is alert and oriented to person, place, and time. No cranial nerve deficit.  Skin: No rash noted. No erythema.  Psychiatric: Affect normal.   LABORATORY PANEL:  Female CBC Recent Labs  Lab 10/25/17 0447  WBC 1.2*  HGB 7.0*  HCT 20.4*  PLT 14*   ------------------------------------------------------------------------------------------------------------------ Chemistries  Recent Labs  Lab 10/25/17 0447  NA 139  K 3.1*  CL 107  CO2 26  GLUCOSE 125*  BUN 22*  CREATININE 0.87  CALCIUM 7.2*  MG 1.9   RADIOLOGY:  No results found. ASSESSMENT AND PLAN:    Symptomatic anemia. S/p PRBC transfusion 1 unit.  Hb up to 8.4 but 7.0 today.  She has no melena or bloody stool. Stool occult is negative.  Anemia workup: Iron level is normal. She will get another unit of PRBC transfusion today.  Follow-up hemoglobin in a.m.  Pancytopenia.  Hold methotrexate, per hematologist consult, need bone marrow biopsy. Worsening WBC and platelet. Follow-up CBC. Transfuse for PLT < 10 per hematology   NSTEMI Troponin level up to 3.31 and down to 2.0. Per Dr. Rockey Situ, Not a good candidate for anticoagulation/heparin infusion given platelets of 25 Not a good candidate for cardiac catheterization at this time as intervention would require aspirin and  Plavix. Agree with atorvastatin, low-dose beta-blocker such as carvedilol as blood pressure tolerates. Consider low-dose aspirin 81 mg daily.  Acute systolic CHF,  ejection fraction 25-30 %. BNP 2087. Continue IV Lasix twice daily, lisinopril and coreg.  Severe hypokalemia.    Improved with potassium supplement.  Potassium decreased to 3.1 today.  Given potassium supplement.  Follow-up level in a.m.  Hypomagnesemia.    Improved with IV magnesium.  Follow-up magnesium while on Lasix.  Hypertension.    Resumed lisinopril and started Coreg. Diabetes.  on sliding scale.  Hold metformin. Severe malnutrition.    Follow-up recommendation by dietitian. Generalized weakness.  PT evaluation. No PT follow-up.  All the records are reviewed and case discussed with Care Management/Social Worker. Management plans discussed with the patient, her sister and they are in agreement.  CODE STATUS: Full Code  TOTAL TIME TAKING CARE OF THIS PATIENT: 37 minutes.   More than 50% of the time was spent in counseling/coordination of care: YES  POSSIBLE D/C IN 3 DAYS, DEPENDING ON CLINICAL CONDITION.   Demetrios Loll M.D on 10/25/2017 at 12:33 PM  Between 7am to 6pm - Pager - [phone]  After 6pm go to [email or web address] - [facility]

## 2017-10-25 NOTE — Progress Notes (Signed)
Progress Note  Patient Name: Lisa Hernandez Date of Encounter: 10/25/2017  Primary Cardiologist: New to [facility] - consult by Goll[place]   Subjective   No chest pain. SOB the same, remains on supplemental oxygen at 2 L via nasal cannula. HGB back down to 7.0 from 8.4 on 11/10. Has received 1 unit of pRBC to date with another unit pending transfusion this morning. PLT count down to 14 from 24. WBC down to 1.2 from 1.5. Path smear pending. Magnesium 1.9. K+ 3.1. SCr 0.87. BUN 22. Diuresed 1.8 L for the past 24 hours and 2.6 L for the admission.   Inpatient Medications    Scheduled Meds: . acetaminophen  650 mg Oral Once  . atorvastatin  10 mg Oral q1800  . calcium-vitamin D  2 tablet Oral Q breakfast  . carvedilol  3.125 mg Oral BID WC  . feeding supplement (ENSURE ENLIVE)  237 mL Oral BID BM  . folic acid  1 mg Oral Daily  . furosemide  20 mg Intravenous Daily  . gabapentin  100 mg Oral BID  . insulin aspart  0-5 Units Subcutaneous QHS  . insulin aspart  0-9 Units Subcutaneous TID WC  . multivitamin  15 mL Oral Daily  . potassium chloride  40 mEq Oral BID  . sodium chloride flush  3 mL Intravenous Q12H   Continuous Infusions: . sodium chloride    . sodium chloride    . sodium chloride     PRN Meds: sodium chloride, acetaminophen **OR** acetaminophen, albuterol, bisacodyl, HYDROcodone-acetaminophen, meclizine, ondansetron **OR** ondansetron (ZOFRAN) IV, senna-docusate, sodium chloride flush   Vital Signs    Vitals:   10/24/17 0838 10/24/17 1636 10/24/17 2024 10/25/17 0410  BP: (!) 143/76 132/66 (!) 148/66 125/65  Pulse: 75 83 80 64  Resp: '18 18 18 18  ' Temp: 98.1 F (36.7 C) 98.2 F (36.8 C) 98.3 F (36.8 C) 98.1 F (36.7 C)  TempSrc: Oral Oral  Oral  SpO2: 99% 98% 100% 100%  Weight:    120 lb 1.6 oz (54.5 kg)  Height:        Intake/Output Summary (Last 24 hours) at 10/25/2017 0824 Last data filed at 10/25/2017 0400 Gross per 24 hour  Intake 240 ml  Output 1450 ml    Net -1210 ml   Filed Weights   10/23/17 1255 10/24/17 0441 10/25/17 0410  Weight: 119 lb 1.6 oz (54 kg) 122 lb 9.6 oz (55.6 kg) 120 lb 1.6 oz (54.5 kg)    Telemetry    NSR with PVCs - Personally Reviewed  ECG    n/a - Personally Reviewed  Physical Exam   GEN: Frail appearing; No acute distress.   Neck: No JVD. Cardiac: RRR, II/VI systolic murmur RUSB, no rubs, or gallops.  Respiratory: Faint bibasilar crackles.  GI: Soft, nontender, non-distended.   MS: No edema; No deformity. Neuro:  Alert and oriented x 3; Nonfocal.  Psych: Normal affect.  Labs    Chemistry Recent Labs  Lab 10/23/17 [phone] 10/23/17 0852 10/24/17 0506 10/25/17 0447  NA 136  --  137 139  K 2.5*  --  3.9 3.1*  CL 104  --  108 107  CO2 22  --  19* 26  GLUCOSE 142*  --  166* 125*  BUN 22*  --  25* 22*  CREATININE 0.96  --  0.95 0.87  CALCIUM 6.9*  --  6.8* 7.2*  ALBUMIN  --  3.0*  --   --  GFRNONAA 55*  --  55* >60  GFRAA >60  --  >60 >60  ANIONGAP 10  --  10 6     Hematology Recent Labs  Lab 10/23/17 1634 10/24/17 0506 10/25/17 0447  WBC 1.5* 1.5* 1.2*  RBC 1.94* 2.57* 2.14*  HGB 6.4* 8.4* 7.0*  HCT 18.6* 24.5* 20.4*  MCV 95.8 95.5 95.4  MCH 32.9 32.8 32.7  MCHC 34.3 34.4 34.2  RDW 18.4* 17.0* 17.3*  PLT 31* 24* 14*    Cardiac Enzymes Recent Labs  Lab 10/23/17 0843 10/23/17 1433 10/23/17 1928 10/24/17 1306  TROPONINI 0.20* 0.96* 2.03* 3.31*   No results for input(s): TROPIPOC in the last 168 hours.   BNP Recent Labs  Lab 10/23/17 1634  BNP 2,087.0*     DDimer No results for input(s): DDIMER in the last 168 hours.   Radiology    Dg Chest 2 View  Result Date: 10/23/2017 IMPRESSION: Mild interstitial edema with associated small bilateral pleural effusions. Atherosclerosis. Electronically Signed   By: Inge Rise M.D.   On: 10/23/2017 08:42    Cardiac Studies   TTE 10/23/2017: Study Conclusions  - Left ventricle: The cavity size was normal. Systolic  function was severely reduced. The estimated ejection fraction was in the range of 25% to 30%. Akinesis of the anterior myocardium. Akinesis of the apical myocardium. Akinesis of the lateral myocardium. Hypokinesis of the anteroseptal myocardium. - Mitral valve: There was moderate regurgitation. - Left atrium: The atrium was moderately dilated. - Right ventricle: Systolic function was normal. - Tricuspid valve: There was moderate regurgitation. - Pulmonary arteries: Systolic pressure was severely elevated. PA peak pressure: 63 mm Hg (S).   Patient Profile     81 y.o. female with history of DM, HTN, RA, GERD, esophageal stricture, and OA, who is being seen today for the evaluation ofchest pain and elevated troponin in the setting of profound pancytopenia.    Assessment & Plan    1. NSTEMI: -Chest pain free -Troponin trended to 3.31 on 11/10, continue to cycle until peak -Echo with WMA consistent with coronary disease  -Unable to treat with heparin or ASA given pancytopenia with PLT of 24 -Unable to perform invasive procedure such as R/LHC given pancytopenia -Coreg 3.125 mg bid as BP allows -Lipitor  -As blood counts allow would pursue cardiac cath at a later date  2. Pancytopenia: -Hematology on board -HGB back down to 7.0 today, pending pRBC transfusion this AM per IM -Possible bone marrow biopsy  -Maintain HGB > 8.5 -PLT count down trending to 14 today -Transfuse for PLT < 10 per hematology   3. Acute systolic CHF/pulmonary HTN: -IV Lasix 20 mg daily -Anemia likely contributing  -Coreg as above given her cardiomyopathy -Add low-dose lisinopril 2.5 mg daily -Not a good candidate for ischemic workup at this time as above given pancytopenia   4. Hypokalemia/hypomagensemia: -Replete potassium to goal > 4.0 -Maintain magnesium > 2.0  5. RA: -Methotrexate on hold     For questions or updates, please contact [facility] Please consult [email or web address]  for contact info under Cardiology/STEMI.    Signed, Christell Faith, PA-C [place] Pager: [phone] 10/25/2017, 8:24 AM   Attending Note Patient seen and examined, agree with detailed note above,  Patient presentation and plan discussed on rounds.   No complaints overnight Receiving additional unit packed red blood cells Denies shortness of breath or chest pain  On physical exam pale, no distress, supine in bed, heart sounds regular with no murmurs  appreciated, lungs clear to auscultation bilaterally, abdomen soft nontender, no significant lower extremity edema  Lab work reviewed with her showing troponin 2.0, white blood cell count 1.2, hemoglobin 7, platelets 14  A/P: Non-STEMI Not a candidate for cardiac catheterization Asymptomatic Profoundly abnormal left ventricular function with wall motion abnormalities anterior and lateral suggesting multivessel coronary disease Consider low-dose aspirin with statin, low-dose beta-blocker if blood pressure tolerates   Greater than 50% was spent in counseling and coordination of care with patient Total encounter time 25 minutes or more   Signed: Esmond Plants  M.D., Ph.D. [facility]

## 2017-10-25 NOTE — Progress Notes (Signed)
WBC 1.2, platelets 14, Hbg 7.0 reported. MD Pyreddy made aware. Pt resting comfortably. No new orders at this time.

## 2017-10-26 ENCOUNTER — Inpatient Hospital Stay: Payer: Medicare Other

## 2017-10-26 ENCOUNTER — Other Ambulatory Visit (HOSPITAL_COMMUNITY)
Admission: RE | Admit: 2017-10-26 | Discharge: 2017-10-26 | Disposition: A | Discharge status: Home / Self Care | Payer: Medicare Other | Source: Ambulatory Visit | Attending: Oncology | Admitting: Oncology

## 2017-10-26 DIAGNOSIS — I5021 Acute systolic (congestive) heart failure: Secondary | ICD-10-CM

## 2017-10-26 LAB — GLUCOSE, CAPILLARY
GLUCOSE-CAPILLARY: 105 mg/dL — AB (ref 65–99)
GLUCOSE-CAPILLARY: 106 mg/dL — AB (ref 65–99)
Glucose-Capillary: 173 mg/dL — ABNORMAL HIGH (ref 65–99)
Glucose-Capillary: 122 mg/dL — ABNORMAL HIGH (ref 65–99)

## 2017-10-26 LAB — RETICULOCYTES
RBC.: 3.41 MIL/uL — ABNORMAL LOW (ref 3.80–5.20)
Retic Count, Absolute: 27.3 10*3/uL (ref 19.0–183.0)
Retic Ct Pct: 0.8 % (ref 0.4–3.1)

## 2017-10-26 LAB — BASIC METABOLIC PANEL
Anion gap: 7 (ref 5–15)
BUN: 18 mg/dL (ref 6–20)
CHLORIDE: 108 mmol/L (ref 101–111)
CO2: 25 mmol/L (ref 22–32)
CREATININE: 0.86 mg/dL (ref 0.44–1.00)
Calcium: 8.3 mg/dL — ABNORMAL LOW (ref 8.9–10.3)
GFR calc Af Amer: >60 mL/min (ref >60)
GFR calc non Af Amer: >60 mL/min (ref >60)
Glucose, Bld: 125 mg/dL — ABNORMAL HIGH (ref 65–99)
POTASSIUM: 4.7 mmol/L (ref 3.5–5.1)
SODIUM: 140 mmol/L (ref 135–145)

## 2017-10-26 LAB — CBC WITH DIFFERENTIAL/PLATELET
BASOS ABS: 0 10*3/uL (ref 0–0.1)
Basophils Relative: 0 %
EOS PCT: 3 %
Eosinophils Absolute: 0.1 10*3/uL (ref 0–0.7)
HEMATOCRIT: 32.4 % — AB (ref 35.0–47.0)
Hemoglobin: 10.9 g/dL — ABNORMAL LOW (ref 12.0–16.0)
LYMPHS ABS: 2.2 10*3/uL (ref 1.0–3.6)
LYMPHS PCT: 85 %
MCH: 32 pg (ref 26.0–34.0)
MCHC: 33.7 g/dL (ref 32.0–36.0)
MCV: 94.9 fL (ref 80.0–100.0)
MONOS PCT: 4 %
Monocytes Absolute: 0.1 10*3/uL — ABNORMAL LOW (ref 0.2–0.9)
NEUTROS ABS: 0.2 10*3/uL — AB (ref 1.4–6.5)
Neutrophils Relative %: 8 %
Platelets: 17 10*3/uL — CL (ref 150–440)
RBC: 3.42 MIL/uL — AB (ref 3.80–5.20)
RDW: 16.4 % — ABNORMAL HIGH (ref 11.5–14.5)
WBC: 2.6 10*3/uL — AB (ref 3.6–11.0)

## 2017-10-26 LAB — HEPATIC FUNCTION PANEL
ALT: 12 U/L — ABNORMAL LOW (ref 14–54)
AST: 25 U/L (ref 15–41)
Albumin: 3.2 g/dL — ABNORMAL LOW (ref 3.5–5.0)
Alkaline Phosphatase: 52 U/L (ref 38–126)
BILIRUBIN DIRECT: 0.5 mg/dL (ref 0.1–0.5)
BILIRUBIN INDIRECT: 1.4 mg/dL — AB (ref 0.3–0.9)
TOTAL PROTEIN: 5.9 g/dL — AB (ref 6.5–8.1)
Total Bilirubin: 1.9 mg/dL — ABNORMAL HIGH (ref 0.3–1.2)

## 2017-10-26 LAB — APTT: APTT: 32 s (ref 24–36)

## 2017-10-26 LAB — MAGNESIUM: MAGNESIUM: 1.5 mg/dL — AB (ref 1.7–2.4)

## 2017-10-26 LAB — PROTIME-INR
INR: 1.1
Prothrombin Time: 14.1 seconds (ref 11.4–15.2)

## 2017-10-26 LAB — TSH: TSH: 3.974 u[IU]/mL (ref 0.350–4.500)

## 2017-10-26 LAB — PATHOLOGIST SMEAR REVIEW

## 2017-10-26 MED ORDER — HEPARIN SOD (PORK) LOCK FLUSH 100 UNIT/ML IV SOLN
INTRAVENOUS | Status: AC
  Filled 2017-10-26: qty 5

## 2017-10-26 MED ORDER — LOSARTAN POTASSIUM 50 MG PO TABS
50.0000 mg | ORAL_TABLET | Freq: Every day | ORAL | Status: DC
  Administered 2017-10-27 – 2017-10-28 (×2): 50 mg via ORAL
  Filled 2017-10-26 (×2): qty 1

## 2017-10-26 MED ORDER — SODIUM CHLORIDE 0.9 % IV SOLN
INTRAVENOUS | Status: AC | PRN
Start: 2017-10-26 — End: 2017-10-26
  Administered 2017-10-26: 10 mL/h via INTRAVENOUS

## 2017-10-26 MED ORDER — MIDAZOLAM HCL 2 MG/2ML IJ SOLN
INTRAMUSCULAR | Status: AC
  Filled 2017-10-26: qty 2

## 2017-10-26 MED ORDER — BUPIVACAINE HCL (PF) 0.25 % IJ SOLN
INTRAMUSCULAR | Status: AC
  Filled 2017-10-26: qty 30

## 2017-10-26 MED ORDER — BUPIVACAINE HCL (PF) 0.25 % IJ SOLN
INTRAMUSCULAR | Status: AC | PRN
  Administered 2017-10-26: 9.5 mL

## 2017-10-26 MED ORDER — FENTANYL CITRATE (PF) 100 MCG/2ML IJ SOLN
INTRAMUSCULAR | Status: AC
  Filled 2017-10-26: qty 2

## 2017-10-26 MED ORDER — FUROSEMIDE 20 MG PO TABS
20.0000 mg | ORAL_TABLET | Freq: Every day | ORAL | Status: DC
  Administered 2017-10-27 – 2017-10-28 (×2): 20 mg via ORAL
  Filled 2017-10-26 (×2): qty 1

## 2017-10-26 MED ORDER — SPIRONOLACTONE 25 MG PO TABS
12.5000 mg | ORAL_TABLET | Freq: Every day | ORAL | Status: DC
  Administered 2017-10-26 – 2017-10-28 (×3): 12.5 mg via ORAL
  Filled 2017-10-26 (×3): qty 1

## 2017-10-26 MED ORDER — CARVEDILOL 6.25 MG PO TABS
6.2500 mg | ORAL_TABLET | Freq: Two times a day (BID) | ORAL | Status: DC
  Administered 2017-10-26 – 2017-10-28 (×4): 6.25 mg via ORAL
  Filled 2017-10-26 (×4): qty 1

## 2017-10-26 NOTE — Progress Notes (Signed)
[facility] [place] NAME: Lisa Hernandez    MR#:  [id number]  DATE OF BIRTH:  07-Oct-1936  SUBJECTIVE:  CHIEF COMPLAINT:   Chief Complaint  Patient presents with  . Chest Pain  no complaints, just came back from bone marrow biopsy REVIEW OF SYSTEMS:  Review of Systems  Constitutional: Positive for malaise/fatigue. Negative for chills and fever.  HENT: Negative for sore throat.   Eyes: Negative for blurred vision and double vision.  Respiratory: Positive for shortness of breath. Negative for cough, hemoptysis, sputum production, wheezing and stridor.   Cardiovascular: Positive for leg swelling. Negative for chest pain, palpitations and orthopnea.  Gastrointestinal: Negative for abdominal pain, blood in stool, diarrhea, melena, nausea and vomiting.  Genitourinary: Negative for dysuria, flank pain and hematuria.  Musculoskeletal: Negative for back pain and joint pain.  Skin: Negative for rash.  Neurological: Positive for weakness. Negative for dizziness, sensory change, focal weakness, seizures, loss of consciousness and headaches.  Endo/Heme/Allergies: Negative for polydipsia.  Psychiatric/Behavioral: Negative for depression. The patient is not nervous/anxious.     DRUG ALLERGIES:   Allergies  Allergen Reactions  . Erythromycin Ethylsuccinate [Erythromycin] Other (See Comments)    GI upset   VITALS:  Blood pressure (!) 146/74, pulse 91, temperature 98.2 F (36.8 C), resp. rate 18, height '5\' 1"'  (1.549 m), weight 55 kg (121 lb 4.8 oz), SpO2 93 %. PHYSICAL EXAMINATION:  Physical Exam  Constitutional: She is oriented to person, place, and time and well-developed, well-nourished, and in no distress.  HENT:  Head: Normocephalic.  Mouth/Throat: Oropharynx is clear and moist.  Eyes: Conjunctivae and EOM are normal. Pupils are equal, round, and reactive to light. No scleral icterus.  Neck: Normal range of motion. Neck supple. No JVD present.  No tracheal deviation present.  Cardiovascular: Normal rate and regular rhythm. Exam reveals no gallop.  Murmur heard. Pulmonary/Chest: Effort normal. No respiratory distress. She has no wheezes. She has rales.  Abdominal: Soft. Bowel sounds are normal. She exhibits no distension. There is no tenderness. There is no rebound.  Musculoskeletal: Normal range of motion. She exhibits edema. She exhibits no tenderness.  Neurological: She is alert and oriented to person, place, and time. No cranial nerve deficit.  Skin: No rash noted. No erythema.  Psychiatric: Affect normal.   LABORATORY PANEL:  Female CBC Recent Labs  Lab 10/26/17 0614  WBC 2.6*  HGB 10.9*  HCT 32.4*  PLT 17*   ------------------------------------------------------------------------------------------------------------------ Chemistries  Recent Labs  Lab 10/26/17 0614  NA 140  K 4.7  CL 108  CO2 25  GLUCOSE 125*  BUN 18  CREATININE 0.86  CALCIUM 8.3*  MG 1.5*  AST 25  ALT 12*  ALKPHOS 52  BILITOT 1.9*   RADIOLOGY:  Ct Bone Marrow Biopsy & Aspiration  Result Date: 10/26/2017 INDICATION: Pancytopenia EXAM: CT-GUIDED BONE MARROW BIOPSY AND ASPIRATION MEDICATIONS: None. ANESTHESIA/SEDATION: None FLUOROSCOPY TIME:  Not applicable COMPLICATIONS: None immediate. PROCEDURE: Informed written consent was obtained from the patient after a thorough discussion of the procedural risks, benefits and alternatives. All questions were addressed. Maximal Sterile Barrier Technique was utilized including caps, mask, sterile gowns, sterile gloves, sterile drape, hand hygiene and skin antiseptic. A timeout was performed prior to the initiation of the procedure. Utilizing CT fluoroscopic guidance and 0.25% Marcaine as a local and deep periosteal anesthetic, an Oncontrol bone biopsy needle was placed into the left iliac wing. Initial aspirates were obtained and deemed adequate for pathology. Subsequently  2 core biopsies were obtained and  sent for pathologic evaluation. The puncture site was dressed in the standard sterile manner. The patient tolerated the procedure well was returned her room in satisfactory condition. IMPRESSION: Successful bone marrow biopsy as described above. Electronically Signed   By: Inez Catalina M.D.   On: 10/26/2017 12:01   ASSESSMENT AND PLAN:    Symptomatic anemia. S/p PRBC transfusion 1 unit.  Hb up to 10.9  today.  She has no melena or bloody stool. Stool occult is negative.  Anemia workup: Iron level is normal. Follow-up hemoglobin in a.m.  Pancytopenia.  Hold methotrexate, per hematologist consult, s/p bone marrow biopsy today, await results Worsening WBC and platelet. Follow-up CBC. Transfuse for PLT < 10 per hematology   NSTEMI Troponin level up to 3.31 and down to 2.0. Per Dr. Rockey Situ, Not a good candidate for anticoagulation/heparin infusion given platelets of 25 Not a good candidate for cardiac catheterization at this time as intervention would require aspirin and Plavix. Agree with atorvastatin, low-dose beta-blocker such as carvedilol as blood pressure tolerates. Consider low-dose aspirin 81 mg daily. Not eligible cardiac rehab as no ischemic w/up yet per nurse navigator  Acute systolic CHF, ejection fraction 25-30 %. BNP 2087. Continue IV Lasix twice daily, lisinopril and coreg.  Severe hypokalemia.    Improved with potassium supplement.  Potassium decreased to 3.1 today.  Given potassium supplement.  Follow-up level in a.m.  Hypomagnesemia.    Improved with IV magnesium.  Follow-up magnesium while on Lasix.  Hypertension.    Resumed lisinopril and started Coreg. Diabetes.  on sliding scale.  Hold metformin. Severe malnutrition.    Follow-up recommendation by dietitian. Generalized weakness.  PT evaluation  All the records are reviewed and case discussed with Care Management/Social Worker. Management plans discussed with the patient, nursing and they are in  agreement.  CODE STATUS: Full Code  TOTAL TIME TAKING CARE OF THIS PATIENT: 35 minutes.   More than 50% of the time was spent in counseling/coordination of care: YES  POSSIBLE D/C IN 2-3 DAYS, DEPENDING ON CLINICAL CONDITION.   Max Sane M.D on 10/26/2017 at 4:59 PM  Between 7am to 6pm - Pager - [phone]  After 6pm go to [email or web address] - [facility]

## 2017-10-26 NOTE — Care Management Note (Signed)
Case Management Note  Patient Details  Name: Lisa Hernandez MRN: [id number] Date of Birth: [birth year]  Subjective/Objective:                 CM consult for home health needs.  Presents from home with chest pain and shortness of breath.  Elevated troponin - nstemi. Unable to place on heparin or pursue further invasive work up due to low platelets. Low EF. Holding methotrexate taken for rheumatoid arthritis. Received transfusion for hgb of 6.2. IV lasix for heart failure. Oncology following for abnormal hematology findings.  Bone marrow biopsy today.  Physical therapy has recommended no physical therapy follow up. Current with PCP.  No issues paying for meds.  No issues with transportation   Action/Plan:   Expected Discharge Date:                  Expected Discharge Plan:     In-House Referral:     Discharge planning Services     Post Acute Care Choice:    Choice offered to:     DME Arranged:    DME Agency:     HH Arranged:    HH Agency:     Status of Service:     If discussed at [employer], dates discussed:    Additional Comments:  Katrina Stack, RN 10/26/2017, 4:52 PM

## 2017-10-26 NOTE — Progress Notes (Signed)
Progress Note  Patient Name: Lisa Hernandez Date of Encounter: 10/26/2017  Primary Cardiologist: Johnny Bridge, MD   Subjective   No chest pain or sob.  For bone marrow Bx today.  Inpatient Medications    Scheduled Meds: . atorvastatin  10 mg Oral q1800  . calcium-vitamin D  2 tablet Oral Q breakfast  . carvedilol  3.125 mg Oral BID WC  . cyanocobalamin  1,000 mcg Intramuscular Daily  . feeding supplement (ENSURE ENLIVE)  237 mL Oral BID BM  . folic acid  1 mg Oral Daily  . furosemide  20 mg Intravenous Daily  . gabapentin  100 mg Oral BID  . insulin aspart  0-5 Units Subcutaneous QHS  . insulin aspart  0-9 Units Subcutaneous TID WC  . lisinopril  10 mg Oral BID  . magnesium oxide  400 mg Oral Daily  . multivitamin  15 mL Oral Daily  . sodium chloride flush  3 mL Intravenous Q12H   Continuous Infusions: . sodium chloride    . sodium chloride     PRN Meds: sodium chloride, acetaminophen **OR** acetaminophen, albuterol, bisacodyl, HYDROcodone-acetaminophen, meclizine, ondansetron **OR** ondansetron (ZOFRAN) IV, senna-docusate, sodium chloride flush   Vital Signs    Vitals:   10/25/17 1647 10/25/17 2121 10/26/17 0331 10/26/17 0817  BP: (!) 149/73 (!) 153/74 (!) 145/78 (!) 159/82  Pulse: 82 87 81 81  Resp:    17  Temp: 98.2 F (36.8 C) 99 F (37.2 C) 98 F (36.7 C) 98.3 F (36.8 C)  TempSrc: Oral Oral Oral Oral  SpO2: 100% 99% 100% 99%  Weight:   121 lb 4.8 oz (55 kg)   Height:        Intake/Output Summary (Last 24 hours) at 10/26/2017 0917 Last data filed at 10/26/2017 9622 Gross per 24 hour  Intake 806.17 ml  Output 500 ml  Net 306.17 ml   Filed Weights   10/24/17 0441 10/25/17 0410 10/26/17 0331  Weight: 122 lb 9.6 oz (55.6 kg) 120 lb 1.6 oz (54.5 kg) 121 lb 4.8 oz (55 kg)    Physical Exam   GEN: Well nourished, well developed, in no acute distress.  HEENT: Grossly normal.  Neck: Supple, no JVD, carotid bruits, or masses. Cardiac: RRR, no murmurs,  rubs, or gallops. No clubbing, cyanosis, edema.  Radials/DP/PT 2+ and equal bilaterally.  Respiratory:  Respirations regular and unlabored, clear to auscultation bilaterally. GI: Soft, nontender, nondistended, BS + x 4. MS: no deformity or atrophy. Skin: warm and dry, no rash. Neuro:  Strength and sensation are intact. Psych: AAOx3.  Normal affect.  Labs    Chemistry Recent Labs  Lab 10/23/17 [phone] 10/24/17 0506 10/25/17 0447 10/26/17 0614  NA  --  137 139 140  K  --  3.9 3.1* 4.7  CL  --  108 107 108  CO2  --  19* 26 25  GLUCOSE  --  166* 125* 125*  BUN  --  25* 22* 18  CREATININE  --  0.95 0.87 0.86  CALCIUM  --  6.8* 7.2* 8.3*  PROT  --   --   --  5.9*  ALBUMIN 3.0*  --   --  3.2*  AST  --   --   --  25  ALT  --   --   --  12*  ALKPHOS  --   --   --  52  BILITOT  --   --   --  1.9*  GFRNONAA  --  55* >60 >60  GFRAA  --  >60 >60 >60  ANIONGAP  --  10 6 7      Hematology Recent Labs  Lab 10/24/17 0506 10/25/17 0447 10/26/17 0614  WBC 1.5* 1.2* 2.6*  RBC 2.57* 2.14* 3.42*  3.41*  HGB 8.4* 7.0* 10.9*  HCT 24.5* 20.4* 32.4*  MCV 95.5 95.4 94.9  MCH 32.8 32.7 32.0  MCHC 34.4 34.2 33.7  RDW 17.0* 17.3* 16.4*  PLT 24* 14* 17*    Cardiac Enzymes Recent Labs  Lab 10/23/17 1433 10/23/17 1928 10/24/17 1306 10/25/17 0447  TROPONINI 0.96* 2.03* 3.31* 2.00*      BNP Recent Labs  Lab 10/23/17 1634  BNP 2,087.0*     Radiology    No results found.  Telemetry    rsr - Personally Reviewed  Cardiac Studies   2D Echocardiogram 11.9.2018  Study Conclusions  - Left ventricle: The cavity size was normal. Systolic function was   severely reduced. The estimated ejection fraction was in the   range of 25% to 30%. Akinesis of the anterior myocardium.   Akinesis of the apical myocardium. Akinesis of the lateral   myocardium. Hypokinesis of the anteroseptal myocardium. - Mitral valve: There was moderate regurgitation. - Left atrium: The atrium was  moderately dilated. - Right ventricle: Systolic function was normal. - Tricuspid valve: There was moderate regurgitation. - Pulmonary arteries: Systolic pressure was severely elevated. PA   peak pressure: 63 mm Hg (S).  Patient Profile     81 y.o. female with history ofDM, HTN, RA, GERD,esophagealstricture, and OA, who was admitted 11/9 with chest pain, dyspnea, chf, NSTEMI, and profoundpancytopenia.EF found to be 25-30%.  Assessment & Plan    1.  Acute systolic CHF/presumed ICM: EF 25-30% by echo 11/9 - presumably new finding in the setting of a 2 wk h/o progressive DOE and chest pain.  Euvolemic on exam.  Minus 2.3L since admission.  Wt hasn't changed much since admission.  I will switch IV lasix to PO.  With ongoing hypertension, I will titrate  blocker and add low dose spironolactone.  Will change lisinopril to losartan so that we can begin process of getting her on Entresto - prob by Wednesday (received lisinopril this AM). Will benefit from ischemic eval @ some point but this is deferred in the setting of pancytopenia and ongoing w/u.  2.  NSTEMI: 2 wk h/o porgressive rest and exertional sscp and dyspnea with nocturnal Ss on night prior to admission.  Peak trop 3.31.  Trending down since. As above, EF 25-30% w/ ant, apical, lateral AK, and antsept HK. No chest pain.  Cont  blocker and  statin.  Conservative Rx for now given pancytopenia w/ plts of 17K  no ASA/anticoagulation.  3.  Pancytopenia:  S/p multiple units of PRBCs this admission.  H/H up. WBC 2.6, Plt 17.  Home dose of MTX on hold (h/o RA). Heme seeing.  For bone marrow bx.  4.  Essential HTN: Trending high.  Titrate  blocker today and add low-dose spiro.  F/u K+ in AM.  5.  DM II: Per IM.  Signed, Murray Hodgkins, NP  10/26/2017, 9:17 AM    For questions or updates, please contact   Please consult [email or web address] for contact info under Cardiology/STEMI.

## 2017-10-26 NOTE — Procedures (Deleted)
CT bone marrow biopsy  Complications:  None  Blood Loss: none  See dictation in canopy pacs  

## 2017-10-26 NOTE — Procedures (Signed)
Ct Bone marrow biopsy  Complications:  None  Blood Loss: none  See dictation in canopy pacs

## 2017-10-27 DIAGNOSIS — D696 Thrombocytopenia, unspecified: Secondary | ICD-10-CM

## 2017-10-27 DIAGNOSIS — D702 Other drug-induced agranulocytosis: Secondary | ICD-10-CM

## 2017-10-27 LAB — BPAM RBC
BLOOD PRODUCT EXPIRATION DATE: 201812032359
BLOOD PRODUCT EXPIRATION DATE: 201812032359
BLOOD PRODUCT EXPIRATION DATE: 201812052359
BLOOD PRODUCT EXPIRATION DATE: 201812052359
ISSUE DATE / TIME: 201811111107
ISSUE DATE / TIME: 201811100025
UNIT TYPE AND RH: 6200
UNIT TYPE AND RH: 6200
Unit Type and Rh: 6200
Unit Type and Rh: 6200

## 2017-10-27 LAB — TYPE AND SCREEN
ABO/RH(D): A POS
ANTIBODY SCREEN: POSITIVE
DONOR AG TYPE: NEGATIVE
Donor AG Type: NEGATIVE
PT AG Type: POSITIVE
UNIT DIVISION: 0
UNIT DIVISION: 0
Unit division: 0
Unit division: 0

## 2017-10-27 LAB — GLUCOSE, CAPILLARY
GLUCOSE-CAPILLARY: 118 mg/dL — AB (ref 65–99)
Glucose-Capillary: 161 mg/dL — ABNORMAL HIGH (ref 65–99)
Glucose-Capillary: 123 mg/dL — ABNORMAL HIGH (ref 65–99)
Glucose-Capillary: 133 mg/dL — ABNORMAL HIGH (ref 65–99)

## 2017-10-27 LAB — CBC
HEMATOCRIT: 28.7 % — AB (ref 35.0–47.0)
Hemoglobin: 9.6 g/dL — ABNORMAL LOW (ref 12.0–16.0)
MCH: 31.2 pg (ref 26.0–34.0)
MCHC: 33.4 g/dL (ref 32.0–36.0)
MCV: 93.4 fL (ref 80.0–100.0)
PLATELETS: 17 10*3/uL — AB (ref 150–440)
RBC: 3.07 MIL/uL — AB (ref 3.80–5.20)
RDW: 16.2 % — ABNORMAL HIGH (ref 11.5–14.5)
WBC: 2.6 10*3/uL — AB (ref 3.6–11.0)

## 2017-10-27 LAB — BASIC METABOLIC PANEL
Anion gap: 8 (ref 5–15)
BUN: 13 mg/dL (ref 6–20)
CHLORIDE: 103 mmol/L (ref 101–111)
CO2: 26 mmol/L (ref 22–32)
CREATININE: 1.11 mg/dL — AB (ref 0.44–1.00)
Calcium: 8.3 mg/dL — ABNORMAL LOW (ref 8.9–10.3)
GFR calc Af Amer: 53 mL/min — ABNORMAL LOW (ref >60)
GFR calc non Af Amer: 46 mL/min — ABNORMAL LOW (ref >60)
Glucose, Bld: 108 mg/dL — ABNORMAL HIGH (ref 65–99)
POTASSIUM: 3.7 mmol/L (ref 3.5–5.1)
SODIUM: 137 mmol/L (ref 135–145)

## 2017-10-27 LAB — MISCELLANEOUS TEST

## 2017-10-27 NOTE — Progress Notes (Signed)
Nutrition Follow Up Note   DOCUMENTATION CODES:   Severe malnutrition in context of acute illness/injury  INTERVENTION:   Dysphagia 3 diet   Recommend check Phosphorus and Magnesium levels as pt at risk for refeeding.   Folic acid 1mg  daily  B12- 1042mcg IM daily x 5 days  Ensure Enlive po BID, each supplement provides 350 kcal and 20 grams of protein  MVI daily  NUTRITION DIAGNOSIS:   Severe Malnutrition related to acute illness, esophageal stricture as evidenced by energy intake < or equal to 50% for > or equal to 5 days, mild fat depletion, moderate muscle depletion, 2 percent weight loss in one week.  GOAL:   Patient will meet greater than or equal to 90% of their needs  MONITOR:   PO intake, Supplement acceptance, Labs, Weight trends, I & O's  ASSESSMENT:   81 y.o. female with a known history of hypertension, diabetes, GERD, esophageal stricture, dysphagia, and the rheumatoid arthritis presents for chest pain    Pt with pancytopenia s/p bone marrow aspirate 11/12. S/p PRBC transfusion 1 unit  Pt with improved appetite and oral intake; eating 100% of meals but refusing Ensure. Per chart, pt is weight stable since admit. Pt with esophageal stricture; on dysphagia 3 diet. Pt reports that she will not eat pureed food. Pt with low folate likely secondary to methotrexate; pt also with low B12. Iron labs ok. Per oncology, preliminarily findings are consistent with marrow suppression and dysplastic features secondary to methotrexate as well as vitamin deficiency. Pt at high refeeding risk; recommend check Mg and P.   Medications reviewed and include: Ca-Vit D, G53, folic acid, lasix, insulin, MgOxide, aldactone  Labs reviewed: creat 1.11(H) Mg 1.5(L)- 11/12 BNP- 2087(H)- 11/9 Folate 3.0(L) B-12 143(L)- 11/9 Wbc 2.6(L), Hgb 9.6(L), Hct 28.7(L)  Diet Order:  DIET DYS 3 Room service appropriate? Yes; Fluid consistency: Thin  EDUCATION NEEDS:   Education needs have been  addressed  Skin:  Reviewed RN Assessment  Last BM:  11/12  Height:   Ht Readings from Last 1 Encounters:  10/23/17 5\' 1"  (1.549 m)    Weight:   Wt Readings from Last 1 Encounters:  10/27/17 118 lb 6.4 oz (53.7 kg)    Ideal Body Weight:  47.7 kg  BMI:  Body mass index is 22.37 kg/m.  Estimated Nutritional Needs:   Kcal:  1300-1500kcal/day   Protein:  65-75g/day   Fluid:  >1.5L/day   Koleen Distance MS, RD, LDN Pager #[phone] After Hours Pager: [phone]

## 2017-10-27 NOTE — Progress Notes (Signed)
Hematology/Oncology Progress Note [facility] Telephone:([phone] [phone]  Patient Care Team: Leonel Ramsay, MD as PCP - General (Infectious Diseases)   Name of the patient: Lisa Hernandez  [id number]  Aug 01, 1936  10/27/17   INTERVAL HISTORY Patient reports feeling better today. S/p prbc transfusion.   Denies any bleeding.   Review of systems-  Constitutional: Positive for weight loss. Negative for chills and fever.  HENT: Negative for sore throat.   Eyes: Negative for blurred vision.  Respiratory: Negative for cough.   Cardiovascular: Positive for chest pain.  Gastrointestinal: Negative for heartburn and vomiting.  Genitourinary: Negative for dysuria.  Musculoskeletal: Negative for myalgias.  Skin: Negative for rash.  Neurological: Positive for weakness. Negative for dizziness.  Endo/Heme/Allergies: Bruises/bleeds easily.  Psychiatric/Behavioral: Negative for suicidal ideas    Allergies  Allergen Reactions  . Erythromycin Ethylsuccinate [Erythromycin] Other (See Comments)    GI upset    Patient Active Problem List   Diagnosis Date Noted  . Pancytopenia ([place])   . B12 deficiency   . Symptomatic anemia 10/23/2017  . Neutropenia ([place])   . Thrombocytopenia ([place])      Past Medical History:  Diagnosis Date  . Diabetes mellitus without complication ([place])   . Dysphagia   . Esophageal stricture    a. s/p dilation x 7; b. 10/14/2017 EGD: benign appearing esophageal stenosis (severe) w/ adherent scar tissue in distal esophagus.  Marland Kitchen GERD (gastroesophageal reflux disease)   . Hypertension   . Osteoarthritis    knees  . Rheumatoid arthritis ([place])   . Thyroid nodule      Past Surgical History:  Procedure Laterality Date  . ABDOMINAL HYSTERECTOMY    . adenomatous colon polyps  08/25/2016  . CHOLECYSTECTOMY    . TONSILLECTOMY      Social History   Socioeconomic History  . Marital status: Divorced    Spouse name: Not on  file  . Number of children: Not on file  . Years of education: Not on file  . Highest education level: Not on file  Social Needs  . Financial resource strain: Not on file  . Food insecurity - worry: Not on file  . Food insecurity - inability: Not on file  . Transportation needs - medical: Not on file  . Transportation needs - non-medical: Not on file  Occupational History  . Occupation: retired  Tobacco Use  . Smoking status: Never Smoker  . Smokeless tobacco: Never Used  Substance and Sexual Activity  . Alcohol use: No  . Drug use: No  . Sexual activity: No  Other Topics Concern  . Not on file  Social History Narrative   Lives in [place] by herself.  Sister nearby.  Active.     Family History  Problem Relation Age of Onset  . Diabetes Mother   . Cancer Mother        died in her 66s  . Diabetes Father   . Heart attack Father        died @ 43  . Diabetes Sister      Current Facility-Administered Medications:  .  0.9 %  sodium chloride infusion, 250 mL, Intravenous, PRN, Demetrios Loll, MD .  0.9 %  sodium chloride infusion, , Intravenous, Once, Demetrios Loll, MD .  acetaminophen (TYLENOL) tablet 650 mg, 650 mg, Oral, Q6H PRN **OR** acetaminophen (TYLENOL) suppository 650 mg, 650 mg, Rectal, Q6H PRN, Demetrios Loll, MD .  albuterol (PROVENTIL) (2.5 MG/3ML) 0.083% nebulizer solution 2.5 mg, 2.5  mg, Nebulization, Q2H PRN, Demetrios Loll, MD .  atorvastatin (LIPITOR) tablet 10 mg, 10 mg, Oral, q1800, Murray Hodgkins R, NP, 10 mg at 10/26/17 1715 .  bisacodyl (DULCOLAX) EC tablet 5 mg, 5 mg, Oral, Daily PRN, Demetrios Loll, MD .  calcium-vitamin D (OSCAL WITH D) 500-200 MG-UNIT per tablet 2 tablet, 2 tablet, Oral, Q breakfast, Demetrios Loll, MD, 2 tablet at 10/27/17 [phone] .  carvedilol (COREG) tablet 6.25 mg, 6.25 mg, Oral, BID WC, Murray Hodgkins R, NP, 6.25 mg at 10/27/17 4142 .  cyanocobalamin ((VITAMIN B-12)) injection 1,000 mcg, 1,000 mcg, Intramuscular, Daily, Earlie Server, MD, 1,000 mcg at  10/27/17 0936 .  feeding supplement (ENSURE ENLIVE) (ENSURE ENLIVE) liquid 237 mL, 237 mL, Oral, BID BM, Demetrios Loll, MD, 237 mL at 10/25/17 1407 .  folic acid (FOLVITE) tablet 1 mg, 1 mg, Oral, Daily, Earlie Server, MD, 1 mg at 10/27/17 0930 .  furosemide (LASIX) tablet 20 mg, 20 mg, Oral, Daily, Murray Hodgkins R, NP, 20 mg at 10/27/17 0930 .  gabapentin (NEURONTIN) capsule 100 mg, 100 mg, Oral, BID, Demetrios Loll, MD, 100 mg at 10/27/17 0930 .  HYDROcodone-acetaminophen (NORCO/VICODIN) 5-325 MG per tablet 1-2 tablet, 1-2 tablet, Oral, Q4H PRN, Demetrios Loll, MD, 1 tablet at 10/26/17 2210 .  insulin aspart (novoLOG) injection 0-5 Units, 0-5 Units, Subcutaneous, QHS, Demetrios Loll, MD .  insulin aspart (novoLOG) injection 0-9 Units, 0-9 Units, Subcutaneous, TID WC, Demetrios Loll, MD, 2 Units at 10/27/17 1231 .  losartan (COZAAR) tablet 50 mg, 50 mg, Oral, Daily, Murray Hodgkins R, NP, 50 mg at 10/27/17 0930 .  magnesium oxide (MAG-OX) tablet 400 mg, 400 mg, Oral, Daily, Dunn, Ryan M, PA-C, 400 mg at 10/27/17 0930 .  meclizine (ANTIVERT) tablet 12.5 mg, 12.5 mg, Oral, TID PRN, Demetrios Loll, MD .  multivitamin liquid 15 mL, 15 mL, Oral, Daily, Demetrios Loll, MD, 15 mL at 10/27/17 1232 .  ondansetron (ZOFRAN) tablet 4 mg, 4 mg, Oral, Q6H PRN **OR** ondansetron (ZOFRAN) injection 4 mg, 4 mg, Intravenous, Q6H PRN, Demetrios Loll, MD .  senna-docusate (Senokot-S) tablet 1 tablet, 1 tablet, Oral, QHS PRN, Demetrios Loll, MD .  sodium chloride flush (NS) 0.9 % injection 3 mL, 3 mL, Intravenous, Q12H, Demetrios Loll, MD, 3 mL at 10/27/17 0931 .  sodium chloride flush (NS) 0.9 % injection 3 mL, 3 mL, Intravenous, PRN, Demetrios Loll, MD .  spironolactone (ALDACTONE) tablet 12.5 mg, 12.5 mg, Oral, Daily, Murray Hodgkins R, NP, 12.5 mg at 10/27/17 0930   Physical exam:  Vitals:   10/26/17 1917 10/27/17 0342 10/27/17 0812 10/27/17 0813  BP: 125/63 (!) 113/55 (!) 157/68 (!) 157/68  Pulse: 83 71 86 96  Resp: _0 Temp: 98.5 F  (36.9 C) 97.7 F (36.5 C)  98.1 F (36.7 C)  TempSrc: Oral Oral  Oral  SpO2: 93% 93%  97%  Weight:  118 lb 6.4 oz (53.7 kg)    Height:       GENERAL:Alert, no distress and comfortable. +Pallor EYES: not icteric  OROPHARYNX: no thrush or ulceration; good dentition  NECK: supple, no masses felt LYMPH:  no palpable lymphadenopathy in the cervical, axillary or inguinal regions LUNGS: clear to auscultation and  No wheeze or crackles HEART/CVS: regular rate & rhythm and no murmurs; No lower extremity edema ABDOMEN: abdomen soft, non-tender and normal bowel sounds Musculoskeletal:no cyanosis of digits and no clubbing  PSYCH: alert & oriented x 3  NEURO: no focal motor/sensory deficits SKIN:  no rashes or significant lesions     CMP Latest Ref Rng & Units 10/27/2017  Glucose 65 - 99 mg/dL 108(H)  BUN 6 - 20 mg/dL 13  Creatinine 0.44 - 1.00 mg/dL 1.11(H)  Sodium 135 - 145 mmol/L 137  Potassium 3.5 - 5.1 mmol/L 3.7  Chloride 101 - 111 mmol/L 103  CO2 22 - 32 mmol/L 26  Calcium 8.9 - 10.3 mg/dL 8.3(L)  Total Protein 6.5 - 8.1 g/dL -  Total Bilirubin 0.3 - 1.2 mg/dL -  Alkaline Phos 38 - 126 U/L -  AST 15 - 41 U/L -  ALT 14 - 54 U/L -   CBC Latest Ref Rng & Units 10/27/2017  WBC 3.6 - 11.0 K/uL 2.6(L)  Hemoglobin 12.0 - 16.0 g/dL 9.6(L)  Hematocrit 35.0 - 47.0 % 28.7(L)  Platelets 150 - 440 K/uL 17(LL)      Dg Chest 2 View  Result Date: 10/23/2017 CLINICAL DATA:  Chest pain beginning at 4 a.m. this morning. Left arm numbness. EXAM: CHEST  2 VIEW COMPARISON:  None. FINDINGS: The patient has small bilateral pleural effusions. There is mild interstitial edema. Mild atelectasis in the lung bases noted. No pneumothorax. Heart size is normal. Aortic atherosclerosis is seen. IMPRESSION: Mild interstitial edema with associated small bilateral pleural effusions. Atherosclerosis. Electronically Signed   By: Inge Rise M.D.   On: 10/23/2017 08:42   Ct Bone Marrow Biopsy &  Aspiration  Result Date: 10/26/2017 INDICATION: Pancytopenia EXAM: CT-GUIDED BONE MARROW BIOPSY AND ASPIRATION MEDICATIONS: None. ANESTHESIA/SEDATION: None FLUOROSCOPY TIME:  Not applicable COMPLICATIONS: None immediate. PROCEDURE: Informed written consent was obtained from the patient after a thorough discussion of the procedural risks, benefits and alternatives. All questions were addressed. Maximal Sterile Barrier Technique was utilized including caps, mask, sterile gowns, sterile gloves, sterile drape, hand hygiene and skin antiseptic. A timeout was performed prior to the initiation of the procedure. Utilizing CT fluoroscopic guidance and 0.25% Marcaine as a local and deep periosteal anesthetic, an Oncontrol bone biopsy needle was placed into the left iliac wing. Initial aspirates were obtained and deemed adequate for pathology. Subsequently 2 core biopsies were obtained and sent for pathologic evaluation. The puncture site was dressed in the standard sterile manner. The patient tolerated the procedure well was returned her room in satisfactory condition. IMPRESSION: Successful bone marrow biopsy as described above. Electronically Signed   By: Inez Catalina M.D.   On: 10/26/2017 12:01    Assessment and plan- Patient is a 81 y.o. female who is on low-dose methotrexate chronically presented with pancytopenia, chest pain and shortness of breath.   # Folate deficiency : continue  folic acid 13m daily.  # Vitamin B12 deficiency: daily Vitamin B12 1000 mcg IM x 5 doses.  If not finishing the course inpatient, I will schedule outpatient shots.   # pancytopenia: possible bone marrow suppression due to MTX, or due to folate/b12 deficiency. Can not rule out underlying bone marrow disorders such as MDS.  S/p bone marrow biopsy,  Transfuse platelet if <10,000. Discussed with pathologist Dr. SGari Crownover the phone regarding patient's bone marrow results. Preliminarily findings are consistent with marrow  suppression and dysplastic features secondary to methotrexate as well as vitamin deficiency. MDS cannot be ruled out. Pending cytogenetics. I will discuss with patient regarding bone marrow findings palpation. # Hold daily aspirin 81 mg. If she recovers, I will resume outpatient. Our scheduler will call her to schedule a follow up appointment with me to recheck counts  Thank you  for this kind referral and the opportunity to participate in the care of this patient. Will continue to follow along her inpatient course.   Earlie Server, MD, PhD [facility] Pager- [phone] 10/27/2017

## 2017-10-27 NOTE — Progress Notes (Signed)
[place] at [place] NAME: Lisa Hernandez    MR#:  [id number]  DATE OF BIRTH:  11/04/1936  SUBJECTIVE:  CHIEF COMPLAINT:   Chief Complaint  Patient presents with  . Chest Pain  no complaints, blood counts remain same as yesterday REVIEW OF SYSTEMS:  Review of Systems  Constitutional: Positive for malaise/fatigue. Negative for chills and fever.  HENT: Negative for sore throat.   Eyes: Negative for blurred vision and double vision.  Respiratory: Positive for shortness of breath. Negative for cough, hemoptysis, sputum production, wheezing and stridor.   Cardiovascular: Positive for leg swelling. Negative for chest pain, palpitations and orthopnea.  Gastrointestinal: Negative for abdominal pain, blood in stool, diarrhea, melena, nausea and vomiting.  Genitourinary: Negative for dysuria, flank pain and hematuria.  Musculoskeletal: Negative for back pain and joint pain.  Skin: Negative for rash.  Neurological: Positive for weakness. Negative for dizziness, sensory change, focal weakness, seizures, loss of consciousness and headaches.  Endo/Heme/Allergies: Negative for polydipsia.  Psychiatric/Behavioral: Negative for depression. The patient is not nervous/anxious.    DRUG ALLERGIES:   Allergies  Allergen Reactions  . Erythromycin Ethylsuccinate [Erythromycin] Other (See Comments)    GI upset   VITALS:  Blood pressure (!) 157/68, pulse 96, temperature 98.1 F (36.7 C), temperature source Oral, resp. rate 18, height '5\' 1"'  (1.549 m), weight 53.7 kg (118 lb 6.4 oz), SpO2 97 %. PHYSICAL EXAMINATION:  Physical Exam  Constitutional: She is oriented to person, place, and time and well-developed, well-nourished, and in no distress.  HENT:  Head: Normocephalic.  Mouth/Throat: Oropharynx is clear and moist.  Eyes: Conjunctivae and EOM are normal. Pupils are equal, round, and reactive to light. No scleral icterus.  Neck: Normal range of motion. Neck  supple. No JVD present. No tracheal deviation present.  Cardiovascular: Normal rate and regular rhythm. Exam reveals no gallop.  Murmur heard. Pulmonary/Chest: Effort normal. No respiratory distress. She has no wheezes. She has rales.  Abdominal: Soft. Bowel sounds are normal. She exhibits no distension. There is no tenderness. There is no rebound.  Musculoskeletal: Normal range of motion. She exhibits edema. She exhibits no tenderness.  Neurological: She is alert and oriented to person, place, and time. No cranial nerve deficit.  Skin: No rash noted. No erythema.  Psychiatric: Affect normal.   LABORATORY PANEL:  Female CBC Recent Labs  Lab 10/27/17 0551  WBC 2.6*  HGB 9.6*  HCT 28.7*  PLT 17*   ------------------------------------------------------------------------------------------------------------------ Chemistries  Recent Labs  Lab 10/26/17 0614 10/27/17 0551  NA 140 137  K 4.7 3.7  CL 108 103  CO2 25 26  GLUCOSE 125* 108*  BUN 18 13  CREATININE 0.86 1.11*  CALCIUM 8.3* 8.3*  MG 1.5*  --   AST 25  --   ALT 12*  --   ALKPHOS 52  --   BILITOT 1.9*  --    RADIOLOGY:  No results found. ASSESSMENT AND PLAN:    Symptomatic anemia. S/p PRBC transfusion 1 unit.  Hb up to 9.6  today.  She has no melena or bloody stool. Stool occult is negative.  Anemia workup: Iron level is normal. Follow-up hemoglobin in a.m.  Pancytopenia.  Hold methotrexate, per hematologist consult, s/p bone marrow biopsy -per oncology preliminarily findings are consistent with marrow suppression and dysplastic features secondary to methotrexate as well as vitamin deficiency. MDS cannot be ruled out. Pending cytogenetics.  - Follow-up CBC. Transfuse for PLT < 10 per hematology  NSTEMI Troponin level up to 3.31 and down to 2.0. Per Dr. Rockey Situ, Not a good candidate for anticoagulation/heparin infusion given platelets of 25 Not a good candidate for cardiac catheterization at this time as  intervention would require aspirin and Plavix. Agree with atorvastatin, low-dose beta-blocker such as carvedilol as blood pressure tolerates. Consider low-dose aspirin 81 mg daily. Not eligible cardiac rehab as no ischemic w/up yet per nurse navigator  Acute systolic CHF, ejection fraction 25-30 %. BNP 2087. Continue IV Lasix twice daily, lisinopril and coreg.  Severe hypokalemia: resolved  Hypomagnesemia.    Improved with IV magnesium.  Follow-up magnesium while on Lasix.  Hypertension: Continue lisinopril and Coreg. Diabetes.  on sliding scale.  Hold metformin. Severe malnutrition.    Follow-up recommendation by dietitian. Generalized weakness.  PT evaluation  All the records are reviewed and case discussed with Care Management/Social Worker. Management plans discussed with the patient, nursing and they are in agreement.  CODE STATUS: Full Code  TOTAL TIME TAKING CARE OF THIS PATIENT: 35 minutes.   More than 50% of the time was spent in counseling/coordination of care: YES  POSSIBLE D/C IN 1-2 DAYS, DEPENDING ON CLINICAL CONDITION.   Max Sane M.D on 10/27/2017 at 4:42 PM  Between 7am to 6pm - Pager - [phone]  After 6pm go to [email or web address] - [facility]

## 2017-10-28 ENCOUNTER — Other Ambulatory Visit: Payer: Self-pay | Admitting: *Deleted

## 2017-10-28 DIAGNOSIS — D61818 Other pancytopenia: Secondary | ICD-10-CM

## 2017-10-28 LAB — CBC
HCT: 26.3 % — ABNORMAL LOW (ref 35.0–47.0)
Hemoglobin: 8.7 g/dL — ABNORMAL LOW (ref 12.0–16.0)
MCH: 30.9 pg (ref 26.0–34.0)
MCHC: 33 g/dL (ref 32.0–36.0)
MCV: 93.5 fL (ref 80.0–100.0)
PLATELETS: 16 10*3/uL — AB (ref 150–440)
RBC: 2.81 MIL/uL — AB (ref 3.80–5.20)
RDW: 16.1 % — ABNORMAL HIGH (ref 11.5–14.5)
WBC: 3 10*3/uL — ABNORMAL LOW (ref 3.6–11.0)

## 2017-10-28 LAB — BASIC METABOLIC PANEL
ANION GAP: 6 (ref 5–15)
BUN: 11 mg/dL (ref 6–20)
CHLORIDE: 105 mmol/L (ref 101–111)
CO2: 26 mmol/L (ref 22–32)
Calcium: 8.4 mg/dL — ABNORMAL LOW (ref 8.9–10.3)
Creatinine, Ser: 0.91 mg/dL (ref 0.44–1.00)
GFR calc Af Amer: >60 mL/min (ref >60)
GFR calc non Af Amer: 58 mL/min — ABNORMAL LOW (ref >60)
GLUCOSE: 115 mg/dL — AB (ref 65–99)
Potassium: 3.5 mmol/L (ref 3.5–5.1)
SODIUM: 137 mmol/L (ref 135–145)

## 2017-10-28 LAB — PHOSPHORUS: Phosphorus: 2.1 mg/dL — ABNORMAL LOW (ref 2.5–4.6)

## 2017-10-28 LAB — GLUCOSE, CAPILLARY
Glucose-Capillary: 115 mg/dL — ABNORMAL HIGH (ref 65–99)
Glucose-Capillary: 158 mg/dL — ABNORMAL HIGH (ref 65–99)

## 2017-10-28 LAB — MAGNESIUM: Magnesium: 1.5 mg/dL — ABNORMAL LOW (ref 1.7–2.4)

## 2017-10-28 MED ORDER — FOLIC ACID 1 MG PO TABS: 1.0000 mg | ORAL_TABLET | Freq: Every day | ORAL | 0 refills | Status: DC

## 2017-10-28 NOTE — Progress Notes (Signed)
Patient stable.  No issues during my shift.  VSS.  See assessment for details.

## 2017-10-28 NOTE — Progress Notes (Signed)
Went over discharge instruction with the patient and family including medication and follow-up appointment. Discontinue peripheral IV and telemetry monitor. Will call volunteer for transport.

## 2017-10-28 NOTE — Discharge Instructions (Signed)
Pancytopenia °Pancytopenia is a condition in which there is an abnormally low amount (deficiency) of the following blood cells: °· Red blood cells (RBCs). Having too few RBCs is called anemia. °· White blood cells (WBCs). Having too few WBCs is called leukopenia. °· Cells that help the blood clot (platelets). Having too few platelets is called thrombocytopenia. ° °Cells that become blood cells (stem cells) are made in the soft tissue inside the bones (bone marrow). All blood cells have a limited lifespan. Blood cells are constantly replaced with new blood cells from the bone marrow. Pancytopenia can be caused by any condition or disease that: °· Destroys the ability of bone marrow to make blood cells. °· Causes bone marrow to make blood cells that cannot survive after they leave the bone marrow. ° °What are the causes? °There are many possible causes of this condition. In some cases, the cause is not known. Common causes include: °· A disease that causes bone marrow to make immature blood cells (megaloblastic anemia). °· A blood disorder that makes bone marrow unable to produce enough new RBCs (aplastic anemia or bone marrow failure). °· An enlarged spleen (hypersplenism). An enlarged spleen can trap blood cells and destroy them faster than they can be replaced. °· Inherited diseases of the blood or bone marrow. °· Cancers that affect bone marrow. °· Certain medicines, such as: °? Chemotherapy. °? Medicines that reduce the activity of the immune system (immunosuppressant medicines). °· Exposure to radiation. °· Severe infections. ° °What increases the risk? °The following factors may make you more likely to develop this condition: °· Being 30?79 years old. °· Being a man. °· Having a family history of a blood or bone marrow disease. °· Having certain conditions, such as: °? Alcohol use disorder. °? HIV (human immunodeficiency virus) or AIDS (acquired immunodeficiency syndrome). °? Cancer. °? Conditions in which the  body’s disease-fighting system attacks normal tissues (autoimmune diseases). ° °What are the signs or symptoms? °Symptoms of this condition vary depending on the cause and may include: °· Anemia. °· Weakness. °· Shortness of breath. °· Unusual bruising and bleeding. °· Frequent infections. °· Fatigue. °· Fever. °· Pale skin. °· Bone pain. °· Night sweats. °· Weight loss. °· Headache. °· Dizziness. °· Feeling unusually cold. ° °How is this diagnosed? °This condition may be diagnosed based on: °· Your symptoms. °· Your medical history. °· A physical exam. °· Removal of a sample of bone marrow to be examined under a microscope (biopsy). This is done by inserting a needle into a bone to remove fluid and cells (aspiration). °· A complete blood count (CBC). This is a group of tests that measures characteristics of WBCs, RBCs, and platelets. °· A peripheral blood smear. This test examines your blood under a microscope to provide information about drugs and diseases that affect RBCs, WBCs, and platelets. °· Reticulocyte count. This is a test that measures the amount of new or immature RBCs (reticulocytes) that are made by your bone marrow. °· Imaging studies of your spleen or liver, such as X-rays. °· A test to measure your vitamin B12 level. °· Tests for viruses. ° °How is this treated? °Treatment for this condition depends on the cause. Treatment may include: °· Immunosuppressant medicines. °· Antibiotic medicine. °· Vitamin B12. This may be given as a treatment for megaloblastic anemia. °· Medicines that help the bone marrow make blood cells (bone marrow stimulating drugs). °· A bone marrow transplant. °· Receiving donated blood through an IV tube (blood transfusion). °· A   procedure to remove your spleen (splenectomy). This may be done as a treatment for hypersplenism. ° °Follow these instructions at home: °Caring for Your Body ° °· Wash your hands often with soap and water. If soap and water are not available, use hand  sanitizer. °· Brush your teeth twice a day, and floss at least once a day. It is recommended that you visit the dentist every six months. °· Stay up to date on your vaccinations, including a yearly (annual) flu shot. Ask your health care provider which vaccines you should get, such as a vaccine against pneumonia. °Medicines °· Take over-the-counter and prescription medicines only as told by your health care provider. °· If you were prescribed an antibiotic medicine, take it as told by your health care provider. Do not stop taking the antibiotic even if you start to feel better. °General instructions °· Work with your health care provider to manage your condition and educate yourself about your condition. °· Do not participate in contact sports or dangerous activities. Ask your health care provider what activities are safe for you. °· Follow food safety recommendations as told by your health care provider. °· During cold and flu season, avoid crowded places and avoid contact with people who are sick. Flu season is typically between the months of October and May. °· Keep all follow-up visits as told by your health care provider. This is important. °Contact a health care provider if: °· You have a fever. °· You bruise or bleed easily. °· You are dizzy. °· You feel unusually weak or tired. °Get help right away if: °· You have bleeding that does not stop. °· You have wheezing or shortness of breath. °· You have chest pain. °These symptoms may represent a serious problem that is an emergency. Do not wait to see if the symptoms will go away. Get medical help right away. Call your local emergency services (911 in the [place] Do not drive yourself to the hospital. °This information is not intended to replace advice given to you by your health care provider. Make sure you discuss any questions you have with your health care provider. °Document Released: 12/28/2015 Document Revised: 05/08/2016 Document Reviewed:  12/27/2014 °Elsevier Interactive Patient Education © 2018 [employer] ° °

## 2017-10-28 NOTE — Care Management Important Message (Signed)
Important Message  Patient Details  Name: Lisa Hernandez MRN: [id number] Date of Birth: [birth year]   Medicare Important Message Given:  Yes Signed IM notice given    Katrina Stack, RN 10/28/2017, 11:49 AM

## 2017-10-28 NOTE — Progress Notes (Signed)
PT Cancellation Note  Patient Details Name: Lisa Hernandez MRN: [id number] DOB: 1936-05-25   Cancelled Treatment:    Reason Eval/Treat Not Completed: PT screened, no needs identified, will sign off. Chart reviewed. RN consulted. Pt was evaluated by PT over the weekend on 11/10 and was independent for all mobility including ambulation x 200' without assistive device. No instability or safety concerns with mobility were identified at that time. Spoke with patient this morning who reports that she is totally safe with her mobility and has no concerns about returning home. She has all the equipment she needs at home as well as good social support. Pt reports she has no need for PT at this time. Pt encouraged to ambulate with staff as desired. Will complete order. Care manager notified. Please enter new order if status or needs change.   Lyndel Safe Huprich PT, DPT   Huprich,Jason 10/28/2017, 9:22 AM

## 2017-10-29 ENCOUNTER — Inpatient Hospital Stay: Payer: Medicare Other

## 2017-10-29 ENCOUNTER — Inpatient Hospital Stay: Payer: Medicare Other | Attending: Oncology | Admitting: Oncology

## 2017-10-29 VITALS — BP 146/111 | HR 91 | Temp 98.0°F | Resp 16

## 2017-10-29 DIAGNOSIS — Z7984 Long term (current) use of oral hypoglycemic drugs: Secondary | ICD-10-CM | POA: Diagnosis not present

## 2017-10-29 DIAGNOSIS — D529 Folate deficiency anemia, unspecified: Secondary | ICD-10-CM | POA: Diagnosis not present

## 2017-10-29 DIAGNOSIS — D519 Vitamin B12 deficiency anemia, unspecified: Secondary | ICD-10-CM

## 2017-10-29 DIAGNOSIS — Z9049 Acquired absence of other specified parts of digestive tract: Secondary | ICD-10-CM | POA: Insufficient documentation

## 2017-10-29 DIAGNOSIS — D61811 Other drug-induced pancytopenia: Secondary | ICD-10-CM | POA: Insufficient documentation

## 2017-10-29 DIAGNOSIS — Z79899 Other long term (current) drug therapy: Secondary | ICD-10-CM | POA: Insufficient documentation

## 2017-10-29 DIAGNOSIS — E119 Type 2 diabetes mellitus without complications: Secondary | ICD-10-CM | POA: Diagnosis not present

## 2017-10-29 DIAGNOSIS — K219 Gastro-esophageal reflux disease without esophagitis: Secondary | ICD-10-CM | POA: Insufficient documentation

## 2017-10-29 DIAGNOSIS — E538 Deficiency of other specified B group vitamins: Secondary | ICD-10-CM | POA: Diagnosis not present

## 2017-10-29 DIAGNOSIS — R634 Abnormal weight loss: Secondary | ICD-10-CM | POA: Insufficient documentation

## 2017-10-29 DIAGNOSIS — M069 Rheumatoid arthritis, unspecified: Secondary | ICD-10-CM | POA: Insufficient documentation

## 2017-10-29 DIAGNOSIS — I10 Essential (primary) hypertension: Secondary | ICD-10-CM

## 2017-10-29 DIAGNOSIS — K222 Esophageal obstruction: Secondary | ICD-10-CM | POA: Diagnosis not present

## 2017-10-29 DIAGNOSIS — R5383 Other fatigue: Secondary | ICD-10-CM | POA: Insufficient documentation

## 2017-10-29 DIAGNOSIS — D61818 Other pancytopenia: Secondary | ICD-10-CM

## 2017-10-29 DIAGNOSIS — R5381 Other malaise: Secondary | ICD-10-CM | POA: Diagnosis not present

## 2017-10-29 LAB — CBC WITH DIFFERENTIAL/PLATELET
BASOS ABS: 0 10*3/uL (ref 0–0.1)
Basophils Relative: 0 %
Eosinophils Absolute: 0.1 10*3/uL (ref 0–0.7)
Eosinophils Relative: 1 %
HEMATOCRIT: 32.5 % — AB (ref 35.0–47.0)
HEMOGLOBIN: 10.8 g/dL — AB (ref 12.0–16.0)
LYMPHS PCT: 21 %
Lymphs Abs: 1.1 10*3/uL (ref 1.0–3.6)
MCH: 32.1 pg (ref 26.0–34.0)
MCHC: 33.4 g/dL (ref 32.0–36.0)
MCV: 96 fL (ref 80.0–100.0)
Monocytes Absolute: 1.3 10*3/uL — ABNORMAL HIGH (ref 0.2–0.9)
Monocytes Relative: 24 %
NEUTROS ABS: 2.9 10*3/uL (ref 1.4–6.5)
NEUTROS PCT: 54 %
PLATELETS: 51 10*3/uL — AB (ref 150–440)
RBC: 3.38 MIL/uL — AB (ref 3.80–5.20)
RDW: 16.9 % — ABNORMAL HIGH (ref 11.5–14.5)
WBC: 5.5 10*3/uL (ref 3.6–11.0)

## 2017-10-29 LAB — COMPREHENSIVE METABOLIC PANEL
ALT: 11 U/L — ABNORMAL LOW (ref 14–54)
ANION GAP: 8 (ref 5–15)
AST: 24 U/L (ref 15–41)
Albumin: 3.4 g/dL — ABNORMAL LOW (ref 3.5–5.0)
Alkaline Phosphatase: 54 U/L (ref 38–126)
BILIRUBIN TOTAL: 1.8 mg/dL — AB (ref 0.3–1.2)
BUN: 13 mg/dL (ref 6–20)
CHLORIDE: 103 mmol/L (ref 101–111)
CO2: 26 mmol/L (ref 22–32)
Calcium: 9 mg/dL (ref 8.9–10.3)
Creatinine, Ser: 1.26 mg/dL — ABNORMAL HIGH (ref 0.44–1.00)
GFR calc Af Amer: 46 mL/min — ABNORMAL LOW (ref >60)
GFR, EST NON AFRICAN AMERICAN: 39 mL/min — AB (ref >60)
Glucose, Bld: 136 mg/dL — ABNORMAL HIGH (ref 65–99)
POTASSIUM: 3.9 mmol/L (ref 3.5–5.1)
Sodium: 137 mmol/L (ref 135–145)
TOTAL PROTEIN: 6 g/dL — AB (ref 6.5–8.1)

## 2017-10-29 LAB — SAMPLE TO BLOOD BANK

## 2017-10-30 ENCOUNTER — Inpatient Hospital Stay: Payer: Medicare Other

## 2017-10-30 ENCOUNTER — Ambulatory Visit: Payer: Medicare Other | Admitting: Oncology

## 2017-10-30 DIAGNOSIS — E538 Deficiency of other specified B group vitamins: Secondary | ICD-10-CM

## 2017-10-30 DIAGNOSIS — D61811 Other drug-induced pancytopenia: Secondary | ICD-10-CM | POA: Diagnosis not present

## 2017-10-30 MED ORDER — CYANOCOBALAMIN 1000 MCG/ML IJ SOLN
1000.0000 ug | Freq: Once | INTRAMUSCULAR | Status: AC
  Administered 2017-10-30: 1000 ug via INTRAMUSCULAR

## 2017-10-31 ENCOUNTER — Encounter: Payer: Self-pay | Admitting: Oncology

## 2017-10-31 NOTE — Discharge Summary (Signed)
[facility] [place] NAME: Lisa Hernandez    MR#:  [id number]  DATE OF BIRTH:  11-03-1936  DATE OF ADMISSION:  10/23/2017   ADMITTING PHYSICIAN: Demetrios Loll, MD  DATE OF DISCHARGE: 10/28/2017  2:52 PM  PRIMARY CARE PHYSICIAN: Leonel Ramsay, MD   ADMISSION DIAGNOSIS:  Hypokalemia [E87.6] Symptomatic anemia [D64.9] DISCHARGE DIAGNOSIS:  Active Problems:   Symptomatic anemia   Neutropenia (HCC)   Thrombocytopenia (HCC)   Pancytopenia (HCC)   B12 deficiency  SECONDARY DIAGNOSIS:   Past Medical History:  Diagnosis Date  . Diabetes mellitus without complication ([place])   . Dysphagia   . Esophageal stricture    a. s/p dilation x 7; b. 10/14/2017 EGD: benign appearing esophageal stenosis (severe) w/ adherent scar tissue in distal esophagus.  Marland Kitchen GERD (gastroesophageal reflux disease)   . Hypertension   . Osteoarthritis    knees  . Rheumatoid arthritis ([place])   . Thyroid nodule    HOSPITAL COURSE:  Patient is a81 y.o.femalewho is on low-dose methotrexate chronically admitted with pancytopenia, chest pain and shortness of breath.  # Folate deficiency : continue  folic acid 44m daily.  # Vitamin B12 deficiency: daily Vitamin B12 1000 mcg IM x 5 doses per onco. Remaining shots will be given as outpatient.  # pancytopenia: possible bone marrow suppression due to MTX, or due to folate/b12 deficiency. Can not rule out underlying bone marrow disorders such as MDS.  S/p bone marrow biopsy, Preliminarily findings are consistent with marrow suppression and dysplastic features secondary to methotrexate as well as vitamin deficiency. MDS cannot be ruled out. Pending cytogenetics. Onco will discuss with patient regarding bone marrow findings as an outpt. Holding daily aspirin 81 mg. If she recovers, ORomie Minuswill resume outpatient. # Symptomatic anemia: s/p PRBC transfusion 1 PRBC. Hb up to 10.8  today.  She has no melena or bloody stool. Stool  occult is negative. Anemia workup: Iron level is normal. # NSTEMI Troponin level up to 3.31 and down to 2.0. Per Cardio - Not a good candidate for anticoagulation/heparin infusion given severe thrombocytopenia Not a good candidate for cardiac catheterization at this time as intervention would require aspirin and Plavix. Continue atorvastatin, low-dose beta-blocker such as carvedilolas blood pressure tolerates. Consider low-dose aspirin 81 mg daily as an outpt per Onco Not eligible cardiac rehab as no ischemic w/up  # Acute on chronic systolic CHF, ejection fraction 25-30 %. Improving with Lasix twice daily, lisinopril and coreg. # Severe hypokalemia: resolved # Hypomagnesemia: Repleted and Resolved # Hypertension: Continue lisinopril and Coreg. # Diabetes DISCHARGE CONDITIONS:  stable CONSULTS OBTAINED:  Treatment Team:  YEarlie Server MD DRUG ALLERGIES:   Allergies  Allergen Reactions  . Erythromycin Ethylsuccinate [Erythromycin] Other (See Comments)    GI upset   DISCHARGE MEDICATIONS:   Allergies as of 10/28/2017      Reactions   Erythromycin Ethylsuccinate [erythromycin] Other (See Comments)   GI upset      Medication List    STOP taking these medications   aspirin EC 81 MG tablet   methotrexate 2.5 MG tablet Commonly known as:  RHEUMATREX     TAKE these medications   atenolol 25 MG tablet Commonly known as:  TENORMIN Take 1 tablet 2 (two) times daily by mouth.   folic acid 1 MG tablet Commonly known as:  FOLVITE Take 1 tablet (1 mg total) daily by mouth.   gabapentin 100 MG capsule Commonly known as:  NEURONTIN Take  100 mg by mouth 2 (two) times daily.   lisinopril 10 MG tablet Commonly known as:  PRINIVIL,ZESTRIL Take 10 mg 2 (two) times daily by mouth.   meclizine 12.5 MG tablet Commonly known as:  ANTIVERT Take 12.5 mg by mouth 3 (three) times daily as needed for dizziness.   metFORMIN 500 MG tablet Commonly known as:  GLUCOPHAGE Take 500 mg by  mouth. Take 2 tabs in am, 1 tab in pm   ONETOUCH DELICA LANCETS 10F Misc 3 daily   pantoprazole 20 MG tablet Commonly known as:  PROTONIX Take 20 mg by mouth daily.        DISCHARGE INSTRUCTIONS:   DIET:  Cardiac diet DISCHARGE CONDITION:  Stable ACTIVITY:  Activity as tolerated OXYGEN:  Home Oxygen: No.  Oxygen Delivery: room air DISCHARGE LOCATION:  home   If you experience worsening of your admission symptoms, develop shortness of breath, life threatening emergency, suicidal or homicidal thoughts you must seek medical attention immediately by calling 911 or calling your MD immediately  if symptoms less severe.  You Must read complete instructions/literature along with all the possible adverse reactions/side effects for all the Medicines you take and that have been prescribed to you. Take any new Medicines after you have completely understood and accpet all the possible adverse reactions/side effects.   Please note  You were cared for by a hospitalist during your hospital stay. If you have any questions about your discharge medications or the care you received while you were in the hospital after you are discharged, you can call the unit and asked to speak with the hospitalist on call if the hospitalist that took care of you is not available. Once you are discharged, your primary care physician will handle any further medical issues. Please note that NO REFILLS for any discharge medications will be authorized once you are discharged, as it is imperative that you return to your primary care physician (or establish a relationship with a primary care physician if you do not have one) for your aftercare needs so that they can reassess your need for medications and monitor your lab values.    On the day of Discharge:  VITAL SIGNS:  Blood pressure 138/66, pulse (!) 112, temperature 98.5 F (36.9 C), temperature source Oral, resp. rate 20, height '5\' 1"'  (1.549 m), weight 53.5 kg  (117 lb 14.4 oz), SpO2 97 %. PHYSICAL EXAMINATION:  GENERAL:  81 y.o.-year-old patient lying in the bed with no acute distress.  EYES: Pupils equal, round, reactive to light and accommodation. No scleral icterus. Extraocular muscles intact.  HEENT: Head atraumatic, normocephalic. Oropharynx and nasopharynx clear.  NECK:  Supple, no jugular venous distention. No thyroid enlargement, no tenderness.  LUNGS: Normal breath sounds bilaterally, no wheezing, rales,rhonchi or crepitation. No use of accessory muscles of respiration.  CARDIOVASCULAR: S1, S2 normal. No murmurs, rubs, or gallops.  ABDOMEN: Soft, non-tender, non-distended. Bowel sounds present. No organomegaly or mass.  EXTREMITIES: No pedal edema, cyanosis, or clubbing.  NEUROLOGIC: Cranial nerves II through XII are intact. Muscle strength 5/5 in all extremities. Sensation intact. Gait not checked.  PSYCHIATRIC: The patient is alert and oriented x 3.  SKIN: No obvious rash, lesion, or ulcer.  DATA REVIEW:   CBC Recent Labs  Lab 10/29/17 1438  WBC 5.5  HGB 10.8*  HCT 32.5*  PLT 51*    Chemistries  Recent Labs  Lab 10/28/17 0436 10/29/17 1438  NA 137 137  K 3.5 3.9  CL 105 103  CO2 26 26  GLUCOSE 115* 136*  BUN 11 13  CREATININE 0.91 1.26*  CALCIUM 8.4* 9.0  MG 1.5*  --   AST  --  24  ALT  --  11*  ALKPHOS  --  54  BILITOT  --  1.8*     Follow-up Information    Leonel Ramsay, MD. Schedule an appointment as soon as possible for a visit in 1 week.   Specialty:  Infectious Diseases Why:  OFFICE WILL CALL PATIENT TO SCHEDULE APPOINTMENT Contact information: [place] [address] [phone]        Earlie Server, MD. Go on 10/29/2017.   Specialty:  Oncology Why:  '@2' :30PM Contact information: [place] [address] [phone]           Management plans discussed with the patient, family and they are in agreement.  CODE STATUS: Prior   TOTAL TIME TAKING  CARE OF THIS PATIENT: 45 minutes.    Max Sane M.D on 10/31/2017 at 4:37 PM  Between 7am to 6pm - Pager - [phone]  After 6pm go to [email or web address] - Proofreader  [facility]  Office  [phone]  CC: Primary care physician; Leonel Ramsay, MD   Note: This dictation was prepared with Dragon dictation along with smaller phrase technology. Any transcriptional errors that result from this process are unintentional.

## 2017-10-31 NOTE — Progress Notes (Addendum)
Hematology/Oncology Follow Up Note [facility]  [phone] [phone]  Patient Care Team: Leonel Ramsay, MD as PCP - General (Infectious Diseases)   Name of the patient: Lisa Hernandez  [id number]  April 07, 1936   REASON FOR VISIT Follow up for pancytopenia.   HISTORY OF PRESENT ILLNESS This is a 81 year old female with past medical history listed as below including rheumatoid arthritis on chronic methotrexate initially presented to emergency room for evaluation of chest pain, SOB, generalized weakness.She was found to have pancytopenia. Chest pain and EKG changes, likely demand mismatch She takes low dose MTX for rheumatoid arthritis for many years. Denies any new medication or herbal supplementation.  Work up revealed folate deficiency and B12 deficiency and she was started on oral folate supplementation and IM B12 injections.  Patient denies any fever or chills. She has a history of esophageal stricture and she is status post a data dictation last week. She denies any new medications except atenolol was added to her management is recently. Denies any herbal medication or supplementation. She denies drinking any alcohol. She has some weight loss because of not being able to eat well due to the esophageal stricture. She had a bone marrow biopsy during admission.   INTERVAL HISTORY Today she follows up for evaluation and management of pancytopenia. She feels fatigue slightly better.    Review of Systems  Constitutional: Positive for malaise/fatigue. Negative for chills and fever.  HENT: Negative for hearing loss.   Eyes: Negative for blurred vision.  Respiratory: Negative for cough.   Cardiovascular: Negative for chest pain.  Gastrointestinal: Negative for heartburn.  Genitourinary: Negative for dysuria.  Musculoskeletal: Positive for joint pain. Negative for myalgias.  Skin: Negative for rash.  Neurological: Negative for dizziness.    Endo/Heme/Allergies: Bruises/bleeds easily.  Psychiatric/Behavioral: Negative for depression.     Allergies  Allergen Reactions  . Erythromycin Ethylsuccinate [Erythromycin] Other (See Comments)    GI upset     Past Medical History:  Diagnosis Date  . Diabetes mellitus without complication ([place])   . Dysphagia   . Esophageal stricture    a. s/p dilation x 7; b. 10/14/2017 EGD: benign appearing esophageal stenosis (severe) w/ adherent scar tissue in distal esophagus.  Marland Kitchen GERD (gastroesophageal reflux disease)   . Hypertension   . Osteoarthritis    knees  . Rheumatoid arthritis ([place])   . Thyroid nodule      Past Surgical History:  Procedure Laterality Date  . ABDOMINAL HYSTERECTOMY    . adenomatous colon polyps  08/25/2016  . CHOLECYSTECTOMY    . ESOPHAGOGASTRODUODENOSCOPY (EGD) WITH PROPOFOL N/A 10/14/2017   Performed by Manya Silvas, MD at [place]  . ESOPHAGOGASTRODUODENOSCOPY (EGD) WITH PROPOFOL N/A 10/27/2016   Performed by Manya Silvas, MD at [place]  . ESOPHAGOGASTRODUODENOSCOPY (EGD) WITH PROPOFOL N/A 10/25/2015   Performed by Manya Silvas, MD at [place]  . ESOPHAGOGASTRODUODENOSCOPY (EGD) WITH PROPOFOL N/A 10/03/2015   Performed by Manya Silvas, MD at [place]  . SAVORY DILATION N/A 10/25/2015   Performed by Manya Silvas, MD at [place]  . SAVORY DILATION N/A 10/03/2015   Performed by Manya Silvas, MD at [place]  . TONSILLECTOMY      Social History   Socioeconomic History  . Marital status: Divorced    Spouse name: Not on file  . Number of children: Not on file  . Years of education: Not on file  . Highest education level:  Not on file  Social Needs  . Financial resource strain: Not on file  . Food insecurity - worry: Not on file  . Food insecurity - inability: Not on file  . Transportation needs - medical: Not on file  . Transportation needs - non-medical: Not on file  Occupational  History  . Occupation: retired  Tobacco Use  . Smoking status: Never Smoker  . Smokeless tobacco: Never Used  Substance and Sexual Activity  . Alcohol use: No  . Drug use: No  . Sexual activity: No  Other Topics Concern  . Not on file  Social History Narrative   Lives in [place] by herself.  Sister nearby.  Active.    Family History  Problem Relation Age of Onset  . Diabetes Mother   . Cancer Mother        died in her 46s  . Diabetes Father   . Heart attack Father        died @ 36  . Diabetes Sister      Current Outpatient Medications:  .  atenolol (TENORMIN) 25 MG tablet, Take 1 tablet 2 (two) times daily by mouth., Disp: , Rfl:  .  folic acid (FOLVITE) 1 MG tablet, Take 1 tablet (1 mg total) daily by mouth., Disp: 30 tablet, Rfl: 0 .  gabapentin (NEURONTIN) 100 MG capsule, Take 100 mg by mouth 2 (two) times daily., Disp: , Rfl:  .  lisinopril (PRINIVIL,ZESTRIL) 10 MG tablet, Take 10 mg 2 (two) times daily by mouth. , Disp: , Rfl:  .  meclizine (ANTIVERT) 12.5 MG tablet, Take 12.5 mg by mouth 3 (three) times daily as needed for dizziness., Disp: , Rfl:  .  metFORMIN (GLUCOPHAGE) 500 MG tablet, Take 500 mg by mouth. Take 2 tabs in am, 1 tab in pm, Disp: , Rfl:  .  ONETOUCH DELICA LANCETS 48J MISC, 3 daily, Disp: , Rfl:  .  pantoprazole (PROTONIX) 20 MG tablet, Take 20 mg by mouth daily., Disp: , Rfl:   Physical exam:  Vitals:   10/29/17 1453  BP: (!) 146/111  Pulse: 91  Resp: 16  Temp: 98 F (36.7 C)   Physical Exam  Constitutional: She is oriented to person, place, and time and well-developed, well-nourished, and in no distress. No distress.  HENT:  Head: Normocephalic and atraumatic.  Eyes: Conjunctivae and EOM are normal. Pupils are equal, round, and reactive to light. No scleral icterus.  Neck: Normal range of motion. Neck supple. No JVD present.  Cardiovascular: Normal rate and regular rhythm. Exam reveals no friction rub.  Pulmonary/Chest: Effort normal and  breath sounds normal.  Abdominal: Soft. Bowel sounds are normal.  Musculoskeletal: Normal range of motion. She exhibits no edema.  Lymphadenopathy:    She has no cervical adenopathy.  Neurological: She is alert and oriented to person, place, and time. Gait normal.  Skin: Skin is warm and dry.  Psychiatric: Affect normal.    CMP Latest Ref Rng & Units 10/29/2017  Glucose 65 - 99 mg/dL 136(H)  BUN 6 - 20 mg/dL 13  Creatinine 0.44 - 1.00 mg/dL 1.26(H)  Sodium 135 - 145 mmol/L 137  Potassium 3.5 - 5.1 mmol/L 3.9  Chloride 101 - 111 mmol/L 103  CO2 22 - 32 mmol/L 26  Calcium 8.9 - 10.3 mg/dL 9.0  Total Protein 6.5 - 8.1 g/dL 6.0(L)  Total Bilirubin 0.3 - 1.2 mg/dL 1.8(H)  Alkaline Phos 38 - 126 U/L 54  AST 15 - 41 U/L 24  ALT  14 - 54 U/L 11(L)   CBC Latest Ref Rng & Units 10/29/2017  WBC 3.6 - 11.0 K/uL 5.5  Hemoglobin 12.0 - 16.0 g/dL 10.8(L)  Hematocrit 35.0 - 47.0 % 32.5(L)  Platelets 150 - 440 K/uL 51(L)    Dg Chest 2 View  Result Date: 10/23/2017 CLINICAL DATA:  Chest pain beginning at 4 a.m. this morning. Left arm numbness. EXAM: CHEST  2 VIEW COMPARISON:  None. FINDINGS: The patient has small bilateral pleural effusions. There is mild interstitial edema. Mild atelectasis in the lung bases noted. No pneumothorax. Heart size is normal. Aortic atherosclerosis is seen. IMPRESSION: Mild interstitial edema with associated small bilateral pleural effusions. Atherosclerosis. Electronically Signed   By: Inge Rise M.D.   On: 10/23/2017 08:42   Ct Bone Marrow Biopsy & Aspiration  Result Date: 10/26/2017 INDICATION: Pancytopenia EXAM: CT-GUIDED BONE MARROW BIOPSY AND ASPIRATION MEDICATIONS: None. ANESTHESIA/SEDATION: None FLUOROSCOPY TIME:  Not applicable COMPLICATIONS: None immediate. PROCEDURE: Informed written consent was obtained from the patient after a thorough discussion of the procedural risks, benefits and alternatives. All questions were addressed. Maximal Sterile  Barrier Technique was utilized including caps, mask, sterile gowns, sterile gloves, sterile drape, hand hygiene and skin antiseptic. A timeout was performed prior to the initiation of the procedure. Utilizing CT fluoroscopic guidance and 0.25% Marcaine as a local and deep periosteal anesthetic, an Oncontrol bone biopsy needle was placed into the left iliac wing. Initial aspirates were obtained and deemed adequate for pathology. Subsequently 2 core biopsies were obtained and sent for pathologic evaluation. The puncture site was dressed in the standard sterile manner. The patient tolerated the procedure well was returned her room in satisfactory condition. IMPRESSION: Successful bone marrow biopsy as described above. Electronically Signed   By: Inez Catalina M.D.   On: 10/26/2017 12:01   Bone marrow biopsy pathology 10/26/2017 Diagnosis Bone Marrow, Aspirate,Biopsy, and Clot, left iliac - HYPERCELLULAR BONE MARROW WITH DYSPOIETIC CHANGES. - SEE COMMENT. PERIPHERAL BLOOD: - PANCYTOPENIA. Diagnosis Note The bone marrow is hypercellular with trilineage hematopoiesis but with relative abundance of erythroid precursors. Myeloid cell lines display dyspoietic changes including megaloblastoid changes but with no increase in blastic cells. I suspect that the changes are likely related to the patients known low vitamin B12 and folate levels in addition to methotrexate treatment. In this setting, it is extremely difficult to assess for the presence or absence of myelodysplastic syndrome. Re-evaluation after vitamin B12 and folate treatment is recommended. Cytogenetic studies may also be helpful in this regard. (BNS:gt, 10/27/17)   Assessment and plan Patient is a 81 y.o. female who is on low-dose methotrexate chronically presented with pancytopenia, chest pain and shortness of breath. 1. Anemia due to vitamin B12 deficiency, unspecified B12 deficiency type   2. Anemia due to folic acid deficiency, unspecified  deficiency type   3. Other drug-induced pancytopenia ([place])    # pancytopenia due to B12 deficiency and folate deficiency, improving. Discussed with pathology Dr.Smir, and these morphological changes can be explained by B12 and folate deficiency, as well as MTX changes. Can not rule out MDS. If her counts does not improve, will send MDS fish panel.  # B12 deficiency: continue B12 1000 mcg IM weekly x4, check Intrinsic factor and anti parietal antibody # folate deficiency: continue oral folate acid 63m daily  ZEarlie Server MD, PhD Hematology Oncology [facility] Hospitalat AValley West Community HospitalPager- [phone]/17/2018

## 2017-11-04 ENCOUNTER — Inpatient Hospital Stay: Payer: Medicare Other

## 2017-11-04 DIAGNOSIS — E538 Deficiency of other specified B group vitamins: Secondary | ICD-10-CM

## 2017-11-04 DIAGNOSIS — D61811 Other drug-induced pancytopenia: Secondary | ICD-10-CM | POA: Diagnosis not present

## 2017-11-04 MED ORDER — CYANOCOBALAMIN 1000 MCG/ML IJ SOLN
1000.0000 ug | Freq: Once | INTRAMUSCULAR | Status: AC
  Administered 2017-11-04: 1000 ug via INTRAMUSCULAR

## 2017-11-11 NOTE — Progress Notes (Signed)
Hematology/Oncology Follow Up Note [facility]  Telephone:([phone] [phone]  Patient Care Team: Leonel Ramsay, MD as PCP - General (Infectious Diseases)   Name of the patient: Lisa Hernandez  [id number]  1936/07/05   REASON FOR VISIT Follow up for pancytopenia.   HISTORY OF PRESENT ILLNESS This is a 81 year old female with past medical history listed as below including rheumatoid arthritis on chronic methotrexate initially presented to emergency room for evaluation of chest pain, SOB, generalized weakness.She was found to have pancytopenia. Chest pain and EKG changes, likely demand mismatch She takes low dose MTX for rheumatoid arthritis for many years. Denies any new medication or herbal supplementation.  Work up revealed folate deficiency and B12 deficiency and she was started on oral folate supplementation and IM B12 injections.  Patient denies any fever or chills. She has a history of esophageal stricture and she is status post a data dictation last week. She denies any new medications except atenolol was added to her management is recently. Denies any herbal medication or supplementation. She denies drinking any alcohol. She has some weight loss because of not being able to eat well due to the esophageal stricture. She had a bone marrow biopsy during admission.   INTERVAL HISTORY Today she follows up for evaluation and management of pancytopenia. She feels fatigue is better. Denies any bleeding, melena or easy bruising   Review of Systems  Constitutional: Positive for malaise/fatigue. Negative for chills and fever.  HENT: Negative for hearing loss.   Eyes: Negative for blurred vision.  Respiratory: Negative for cough.   Cardiovascular: Negative for chest pain.  Gastrointestinal: Negative for heartburn.  Genitourinary: Negative for dysuria.  Musculoskeletal: Positive for joint pain. Negative for myalgias.  Skin: Negative for rash.    Neurological: Negative for dizziness.  Endo/Heme/Allergies: Does not bruise/bleed easily.  Psychiatric/Behavioral: Negative for depression.     Allergies  Allergen Reactions  . Erythromycin Ethylsuccinate [Erythromycin] Other (See Comments)    GI upset     Past Medical History:  Diagnosis Date  . Diabetes mellitus without complication ([place])   . Dysphagia   . Esophageal stricture    a. s/p dilation x 7; b. 10/14/2017 EGD: benign appearing esophageal stenosis (severe) w/ adherent scar tissue in distal esophagus.  Marland Kitchen GERD (gastroesophageal reflux disease)   . Hypertension   . Osteoarthritis    knees  . Rheumatoid arthritis ([place])   . Thyroid nodule      Past Surgical History:  Procedure Laterality Date  . ABDOMINAL HYSTERECTOMY    . adenomatous colon polyps  08/25/2016  . CHOLECYSTECTOMY    . ESOPHAGOGASTRODUODENOSCOPY (EGD) WITH PROPOFOL N/A 10/03/2015   Procedure: ESOPHAGOGASTRODUODENOSCOPY (EGD) WITH PROPOFOL;  Surgeon: Manya Silvas, MD;  Location: [facility] ENDOSCOPY;  Service: Endoscopy;  Laterality: N/A;  . ESOPHAGOGASTRODUODENOSCOPY (EGD) WITH PROPOFOL N/A 10/25/2015   Procedure: ESOPHAGOGASTRODUODENOSCOPY (EGD) WITH PROPOFOL;  Surgeon: Manya Silvas, MD;  Location: [facility] ENDOSCOPY;  Service: Endoscopy;  Laterality: N/A;   IDDM  . ESOPHAGOGASTRODUODENOSCOPY (EGD) WITH PROPOFOL N/A 10/27/2016   Procedure: ESOPHAGOGASTRODUODENOSCOPY (EGD) WITH PROPOFOL;  Surgeon: Manya Silvas, MD;  Location: [facility] ENDOSCOPY;  Service: Endoscopy;  Laterality: N/A;  Diabetic  . ESOPHAGOGASTRODUODENOSCOPY (EGD) WITH PROPOFOL N/A 10/14/2017   Procedure: ESOPHAGOGASTRODUODENOSCOPY (EGD) WITH PROPOFOL;  Surgeon: Manya Silvas, MD;  Location: [facility] ENDOSCOPY;  Service: Endoscopy;  Laterality: N/A;  . SAVORY DILATION N/A 10/03/2015   Procedure: SAVORY DILATION;  Surgeon: Manya Silvas, MD;  Location: [facility] ENDOSCOPY;  Service: Endoscopy;  Laterality: N/A;  . SAVORY DILATION N/A  10/25/2015   Procedure: SAVORY DILATION;  Surgeon: Manya Silvas, MD;  Location: [facility] ENDOSCOPY;  Service: Endoscopy;  Laterality: N/A;  . TONSILLECTOMY      Social History   Socioeconomic History  . Marital status: Divorced    Spouse name: Not on file  . Number of children: Not on file  . Years of education: Not on file  . Highest education level: Not on file  Social Needs  . Financial resource strain: Not on file  . Food insecurity - worry: Not on file  . Food insecurity - inability: Not on file  . Transportation needs - medical: Not on file  . Transportation needs - non-medical: Not on file  Occupational History  . Occupation: retired  Tobacco Use  . Smoking status: Never Smoker  . Smokeless tobacco: Never Used  Substance and Sexual Activity  . Alcohol use: No  . Drug use: No  . Sexual activity: No  Other Topics Concern  . Not on file  Social History Narrative   Lives in [place] by herself.  Sister nearby.  Active.    Family History  Problem Relation Age of Onset  . Diabetes Mother   . Cancer Mother        died in her 22s  . Diabetes Father   . Heart attack Father        died @ 32  . Diabetes Sister      Current Outpatient Medications:  .  atenolol (TENORMIN) 25 MG tablet, Take 1 tablet 2 (two) times daily by mouth., Disp: , Rfl:  .  folic acid (FOLVITE) 1 MG tablet, Take 1 tablet (1 mg total) daily by mouth., Disp: 30 tablet, Rfl: 0 .  gabapentin (NEURONTIN) 100 MG capsule, Take 100 mg by mouth 2 (two) times daily., Disp: , Rfl:  .  lisinopril (PRINIVIL,ZESTRIL) 10 MG tablet, Take 10 mg 2 (two) times daily by mouth. , Disp: , Rfl:  .  meclizine (ANTIVERT) 12.5 MG tablet, Take 12.5 mg by mouth 3 (three) times daily as needed for dizziness., Disp: , Rfl:  .  metFORMIN (GLUCOPHAGE) 500 MG tablet, Take 500 mg by mouth. Take 2 tabs in am, 1 tab in pm, Disp: , Rfl:  .  ONETOUCH DELICA LANCETS 68L MISC, 3 daily, Disp: , Rfl:  .  pantoprazole (PROTONIX) 20 MG  tablet, Take 20 mg by mouth daily., Disp: , Rfl:   Physical exam:  Vitals:   11/12/17 1425 11/12/17 1426  BP:  (!) 171/62  Pulse:  60  Resp: 16   Temp: (!) 97.1 F (36.2 C)   TempSrc: Tympanic   Weight: 117 lb 6.4 oz (53.3 kg)    GENERAL: No distress, well nourished.  SKIN:  No rashes or significant lesions  HEAD: Normocephalic, No masses, lesions, tenderness or abnormalities  EYES: Conjunctiva are pink, non icteric ENT: External ears normal ,lips ,  LYMPH: No palpable cervical and axillary lymphadenopathy  LUNGS: Clear to auscultation, no crackles or wheezes HEART: Regular rate & rhythm, no murmurs, no gallops, S1 normal and S2 normal  ABDOMEN: Abdomen soft, non-tender, normal bowel sounds, I did not appreciate any  masses or organomegaly  MUSCULOSKELETAL: No CVA tenderness and no tenderness on percussion of the back or rib cage.  EXTREMITIES: No edema, no skin discoloration or tenderness NEURO: Alert & oriented, no focal motor/sensory deficits.   CMP Latest Ref Rng & Units 10/29/2017  Glucose 65 -  99 mg/dL 136(H)  BUN 6 - 20 mg/dL 13  Creatinine 0.44 - 1.00 mg/dL 1.26(H)  Sodium 135 - 145 mmol/L 137  Potassium 3.5 - 5.1 mmol/L 3.9  Chloride 101 - 111 mmol/L 103  CO2 22 - 32 mmol/L 26  Calcium 8.9 - 10.3 mg/dL 9.0  Total Protein 6.5 - 8.1 g/dL 6.0(L)  Total Bilirubin 0.3 - 1.2 mg/dL 1.8(H)  Alkaline Phos 38 - 126 U/L 54  AST 15 - 41 U/L 24  ALT 14 - 54 U/L 11(L)   CBC Latest Ref Rng & Units 10/29/2017  WBC 3.6 - 11.0 K/uL 5.5  Hemoglobin 12.0 - 16.0 g/dL 10.8(L)  Hematocrit 35.0 - 47.0 % 32.5(L)  Platelets 150 - 440 K/uL 51(L)     Bone marrow biopsy pathology 10/26/2017 Diagnosis Bone Marrow, Aspirate,Biopsy, and Clot, left iliac - HYPERCELLULAR BONE MARROW WITH DYSPOIETIC CHANGES. - SEE COMMENT. PERIPHERAL BLOOD: - PANCYTOPENIA. Diagnosis Note The bone marrow is hypercellular with trilineage hematopoiesis but with relative abundance of erythroid  precursors. Myeloid cell lines display dyspoietic changes including megaloblastoid changes but with no increase in blastic cells. I suspect that the changes are likely related to the patients known low vitamin B12 and folate levels in addition to methotrexate treatment. In this setting, it is extremely difficult to assess for the presence or absence of myelodysplastic syndrome. Re-evaluation after vitamin B12 and folate treatment is recommended. Cytogenetic studies may also be helpful in this regard. (BNS:gt, 10/27/17)   Assessment and plan Patient is a 81 y.o. female who is on low-dose methotrexate chronically presented with pancytopenia, chest pain and shortness of breath. 1. B12 deficiency   2. Anemia due to vitamin B12 deficiency, unspecified B12 deficiency type   3. Anemia due to folic acid deficiency, unspecified deficiency type   4. Other drug-induced pancytopenia ([place])    # pancytopenia due to B12 deficiency and folate deficiency, improving.  # B12 deficiency: continue B12 1000 mcg IM and proceed her last weekly dose today.  check Intrinsic factor and anti parietal antibody # folate deficiency: continue oral folate acid 39m daily # Schedule B12 IM injecting monthly starting January.   Follow up in 3 months with repeat cbc, cmp, b12,folate)  ZEarlie Server MD, PhD Hematology Oncology [facility]ations[facility] ABrevard Surgery CenterPager- [phone]/28/2018

## 2017-11-12 ENCOUNTER — Encounter (HOSPITAL_COMMUNITY): Payer: Self-pay

## 2017-11-12 ENCOUNTER — Inpatient Hospital Stay: Payer: Medicare Other

## 2017-11-12 ENCOUNTER — Encounter: Payer: Self-pay | Admitting: Oncology

## 2017-11-12 ENCOUNTER — Encounter: Payer: Self-pay | Admitting: *Deleted

## 2017-11-12 ENCOUNTER — Inpatient Hospital Stay (HOSPITAL_BASED_OUTPATIENT_CLINIC_OR_DEPARTMENT_OTHER): Payer: Medicare Other | Admitting: Oncology

## 2017-11-12 VITALS — BP 171/62 | HR 60 | Temp 97.1°F | Resp 16 | Wt 117.4 lb

## 2017-11-12 DIAGNOSIS — M069 Rheumatoid arthritis, unspecified: Secondary | ICD-10-CM | POA: Diagnosis not present

## 2017-11-12 DIAGNOSIS — R634 Abnormal weight loss: Secondary | ICD-10-CM | POA: Diagnosis not present

## 2017-11-12 DIAGNOSIS — R5381 Other malaise: Secondary | ICD-10-CM

## 2017-11-12 DIAGNOSIS — Z9049 Acquired absence of other specified parts of digestive tract: Secondary | ICD-10-CM

## 2017-11-12 DIAGNOSIS — K219 Gastro-esophageal reflux disease without esophagitis: Secondary | ICD-10-CM | POA: Diagnosis not present

## 2017-11-12 DIAGNOSIS — D529 Folate deficiency anemia, unspecified: Secondary | ICD-10-CM | POA: Diagnosis not present

## 2017-11-12 DIAGNOSIS — R5383 Other fatigue: Secondary | ICD-10-CM

## 2017-11-12 DIAGNOSIS — E119 Type 2 diabetes mellitus without complications: Secondary | ICD-10-CM

## 2017-11-12 DIAGNOSIS — D519 Vitamin B12 deficiency anemia, unspecified: Secondary | ICD-10-CM

## 2017-11-12 DIAGNOSIS — K222 Esophageal obstruction: Secondary | ICD-10-CM

## 2017-11-12 DIAGNOSIS — D61811 Other drug-induced pancytopenia: Secondary | ICD-10-CM | POA: Diagnosis not present

## 2017-11-12 DIAGNOSIS — Z7984 Long term (current) use of oral hypoglycemic drugs: Secondary | ICD-10-CM

## 2017-11-12 DIAGNOSIS — E538 Deficiency of other specified B group vitamins: Secondary | ICD-10-CM

## 2017-11-12 DIAGNOSIS — I10 Essential (primary) hypertension: Secondary | ICD-10-CM

## 2017-11-12 DIAGNOSIS — Z79899 Other long term (current) drug therapy: Secondary | ICD-10-CM

## 2017-11-12 LAB — CBC WITH DIFFERENTIAL/PLATELET
BASOS PCT: 2 %
Basophils Absolute: 0.2 10*3/uL — ABNORMAL HIGH (ref 0–0.1)
Eosinophils Absolute: 0 10*3/uL (ref 0–0.7)
Eosinophils Relative: 0 %
HCT: 30.1 % — ABNORMAL LOW (ref 35.0–47.0)
Hemoglobin: 10.1 g/dL — ABNORMAL LOW (ref 12.0–16.0)
LYMPHS ABS: 2.3 10*3/uL (ref 1.0–3.6)
LYMPHS PCT: 27 %
MCH: 32 pg (ref 26.0–34.0)
MCHC: 33.5 g/dL (ref 32.0–36.0)
MCV: 95.5 fL (ref 80.0–100.0)
MONOS PCT: 9 %
Monocytes Absolute: 0.8 10*3/uL (ref 0.2–0.9)
NEUTROS ABS: 5.2 10*3/uL (ref 1.4–6.5)
Neutrophils Relative %: 62 %
PLATELETS: 260 10*3/uL (ref 150–440)
RBC: 3.15 MIL/uL — AB (ref 3.80–5.20)
RDW: 17.5 % — ABNORMAL HIGH (ref 11.5–14.5)
WBC: 8.5 10*3/uL (ref 3.6–11.0)

## 2017-11-12 LAB — FOLATE: Folate: 50.1 ng/mL (ref >5.9)

## 2017-11-12 LAB — CHROMOSOME ANALYSIS, BONE MARROW

## 2017-11-12 LAB — VITAMIN B12: Vitamin B-12: 1858 pg/mL — ABNORMAL HIGH (ref 180–914)

## 2017-11-12 MED ORDER — CYANOCOBALAMIN 1000 MCG/ML IJ SOLN
1000.0000 ug | Freq: Once | INTRAMUSCULAR | Status: AC
  Administered 2017-11-12: 1000 ug via INTRAMUSCULAR
  Filled 2017-11-12: qty 1

## 2017-11-12 NOTE — Progress Notes (Signed)
Patient is here today for a follow up. Patient states no new concerns today.  

## 2017-11-13 ENCOUNTER — Ambulatory Visit
Admission: RE | Admit: 2017-11-13 | Discharge: 2017-11-13 | Disposition: A | Discharge status: Home / Self Care | Payer: Medicare Other | Source: Ambulatory Visit | Attending: Unknown Physician Specialty | Admitting: Unknown Physician Specialty

## 2017-11-13 ENCOUNTER — Ambulatory Visit: Payer: Medicare Other | Admitting: Anesthesiology

## 2017-11-13 ENCOUNTER — Encounter
Admission: RE | Disposition: A | Discharge status: Home / Self Care | Payer: Self-pay | Source: Ambulatory Visit | Attending: Unknown Physician Specialty

## 2017-11-13 DIAGNOSIS — K222 Esophageal obstruction: Secondary | ICD-10-CM | POA: Insufficient documentation

## 2017-11-13 DIAGNOSIS — I10 Essential (primary) hypertension: Secondary | ICD-10-CM | POA: Diagnosis not present

## 2017-11-13 DIAGNOSIS — Z79899 Other long term (current) drug therapy: Secondary | ICD-10-CM | POA: Insufficient documentation

## 2017-11-13 DIAGNOSIS — M170 Bilateral primary osteoarthritis of knee: Secondary | ICD-10-CM | POA: Insufficient documentation

## 2017-11-13 DIAGNOSIS — M069 Rheumatoid arthritis, unspecified: Secondary | ICD-10-CM | POA: Diagnosis not present

## 2017-11-13 DIAGNOSIS — Z881 Allergy status to other antibiotic agents status: Secondary | ICD-10-CM | POA: Diagnosis not present

## 2017-11-13 DIAGNOSIS — K219 Gastro-esophageal reflux disease without esophagitis: Secondary | ICD-10-CM | POA: Insufficient documentation

## 2017-11-13 DIAGNOSIS — E119 Type 2 diabetes mellitus without complications: Secondary | ICD-10-CM | POA: Diagnosis not present

## 2017-11-13 DIAGNOSIS — Z7984 Long term (current) use of oral hypoglycemic drugs: Secondary | ICD-10-CM | POA: Diagnosis not present

## 2017-11-13 DIAGNOSIS — R1310 Dysphagia, unspecified: Secondary | ICD-10-CM | POA: Diagnosis present

## 2017-11-13 HISTORY — PX: ESOPHAGOGASTRODUODENOSCOPY (EGD) WITH PROPOFOL: SHX5813

## 2017-11-13 LAB — ANTI-PARIETAL ANTIBODY: Parietal Cell Antibody-IgG: 3.3 Units (ref 0.0–20.0)

## 2017-11-13 LAB — INTRINSIC FACTOR ANTIBODIES: Intrinsic Factor: 1.2 AU/mL — ABNORMAL HIGH (ref 0.0–1.1)

## 2017-11-13 LAB — GLUCOSE, CAPILLARY: Glucose-Capillary: 76 mg/dL (ref 65–99)

## 2017-11-13 SURGERY — ESOPHAGOGASTRODUODENOSCOPY (EGD) WITH PROPOFOL
Anesthesia: General

## 2017-11-13 MED ORDER — SODIUM CHLORIDE 0.9 % IV SOLN
INTRAVENOUS | Status: DC | PRN
  Administered 2017-11-13: 08:00:00 via INTRAVENOUS

## 2017-11-13 MED ORDER — PROPOFOL 500 MG/50ML IV EMUL
INTRAVENOUS | Status: AC
  Filled 2017-11-13: qty 50

## 2017-11-13 MED ORDER — PROPOFOL 500 MG/50ML IV EMUL
INTRAVENOUS | Status: DC | PRN
  Administered 2017-11-13: 150 ug/kg/min via INTRAVENOUS

## 2017-11-13 MED ORDER — PROPOFOL 10 MG/ML IV BOLUS
INTRAVENOUS | Status: DC | PRN
  Administered 2017-11-13: 30 mg via INTRAVENOUS
  Administered 2017-11-13: 10 mg via INTRAVENOUS

## 2017-11-13 MED ORDER — SODIUM CHLORIDE 0.9 % IV SOLN: INTRAVENOUS | Status: DC

## 2017-11-13 MED ORDER — LIDOCAINE HCL (PF) 2 % IJ SOLN
INTRAMUSCULAR | Status: DC | PRN
  Administered 2017-11-13: 50 mg via INTRADERMAL

## 2017-11-13 MED ORDER — SODIUM CHLORIDE 0.9 % IV SOLN
INTRAVENOUS | Status: DC
  Administered 2017-11-13: 08:00:00 via INTRAVENOUS

## 2017-11-13 NOTE — Transfer of Care (Signed)
Immediate Anesthesia Transfer of Care Note  Patient: Lisa Hernandez  Procedure(s) Performed: ESOPHAGOGASTRODUODENOSCOPY (EGD) WITH PROPOFOL (N/A )  Patient Location: Endoscopy Unit  Anesthesia Type:General  Level of Consciousness: sedated  Airway & Oxygen Therapy: Patient Spontanous Breathing and Patient connected to nasal cannula oxygen  Post-op Assessment: Report given to RN and Post -op Vital signs reviewed and stable  Post vital signs: Reviewed and stable  Last Vitals:  Vitals:   11/13/17 0743  BP: (!) 179/69  Pulse: 62  Resp: 14  Temp: (!) 35.7 C  SpO2: 100%    Last Pain:  Vitals:   11/13/17 0743  TempSrc: Tympanic         Complications: No apparent anesthesia complications

## 2017-11-13 NOTE — H&P (Signed)
Primary Care Physician:  Leonel Ramsay, MD Primary Gastroenterologist:  Dr. [name]  Pre-Procedure History & Physical: HPI:  Lisa SUMNERS is a 81 y.o. female is here for an endoscopy.   Past Medical History:  Diagnosis Date  . Diabetes mellitus without complication ([place])   . Dysphagia   . Esophageal stricture    a. s/p dilation x 7; b. 10/14/2017 EGD: benign appearing esophageal stenosis (severe) w/ adherent scar tissue in distal esophagus.  Marland Kitchen GERD (gastroesophageal reflux disease)   . Hypertension   . Osteoarthritis    knees  . Rheumatoid arthritis ([place])   . Thyroid nodule     Past Surgical History:  Procedure Laterality Date  . ABDOMINAL HYSTERECTOMY    . adenomatous colon polyps  08/25/2016  . CHOLECYSTECTOMY    . COLONOSCOPY    . ESOPHAGOGASTRODUODENOSCOPY (EGD) WITH PROPOFOL N/A 10/03/2015   Procedure: ESOPHAGOGASTRODUODENOSCOPY (EGD) WITH PROPOFOL;  Surgeon: Manya Silvas, MD;  Location: [facility] ENDOSCOPY;  Service: Endoscopy;  Laterality: N/A;  . ESOPHAGOGASTRODUODENOSCOPY (EGD) WITH PROPOFOL N/A 10/25/2015   Procedure: ESOPHAGOGASTRODUODENOSCOPY (EGD) WITH PROPOFOL;  Surgeon: Manya Silvas, MD;  Location: [facility] ENDOSCOPY;  Service: Endoscopy;  Laterality: N/A;   IDDM  . ESOPHAGOGASTRODUODENOSCOPY (EGD) WITH PROPOFOL N/A 10/27/2016   Procedure: ESOPHAGOGASTRODUODENOSCOPY (EGD) WITH PROPOFOL;  Surgeon: Manya Silvas, MD;  Location: [facility] ENDOSCOPY;  Service: Endoscopy;  Laterality: N/A;  Diabetic  . ESOPHAGOGASTRODUODENOSCOPY (EGD) WITH PROPOFOL N/A 10/14/2017   Procedure: ESOPHAGOGASTRODUODENOSCOPY (EGD) WITH PROPOFOL;  Surgeon: Manya Silvas, MD;  Location: [facility] ENDOSCOPY;  Service: Endoscopy;  Laterality: N/A;  . SAVORY DILATION N/A 10/03/2015   Procedure: SAVORY DILATION;  Surgeon: Manya Silvas, MD;  Location: [facility] ENDOSCOPY;  Service: Endoscopy;  Laterality: N/A;  . SAVORY DILATION N/A 10/25/2015   Procedure: SAVORY DILATION;  Surgeon: Manya Silvas, MD;  Location: [facility] ENDOSCOPY;  Service: Endoscopy;  Laterality: N/A;  . TONSILLECTOMY      Prior to Admission medications   Medication Sig Start Date End Date Taking? Authorizing Provider  atenolol (TENORMIN) 25 MG tablet Take 1 tablet 2 (two) times daily by mouth. 10/09/17 10/09/18 Yes [provider]  folic acid (FOLVITE) 1 MG tablet Take 1 tablet (1 mg total) daily by mouth. 10/29/17  Yes Max Sane, MD  gabapentin (NEURONTIN) 100 MG capsule Take 100 mg by mouth 2 (two) times daily.   Yes [provider]  lisinopril (PRINIVIL,ZESTRIL) 10 MG tablet Take 10 mg 2 (two) times daily by mouth.    Yes [provider]  meclizine (ANTIVERT) 12.5 MG tablet Take 12.5 mg by mouth 3 (three) times daily as needed for dizziness.   Yes [provider]  metFORMIN (GLUCOPHAGE) 500 MG tablet Take 500 mg by mouth. Take 2 tabs in am, 1 tab in pm   Yes [provider]  ONETOUCH DELICA LANCETS 40J MISC 3 daily   Yes [provider]  pantoprazole (PROTONIX) 20 MG tablet Take 20 mg by mouth daily.   Yes [provider]    Allergies as of 10/21/2017 - Review Complete 10/14/2017  Allergen Reaction Noted  . Erythromycin ethylsuccinate [erythromycin] Other (See Comments) 10/02/2015    Family History  Problem Relation Age of Onset  . Diabetes Mother   . Cancer Mother        died in her 30s  . Diabetes Father   . Heart attack Father        died @ 64  . Diabetes Sister  Social History   Socioeconomic History  . Marital status: Divorced    Spouse name: Not on file  . Number of children: Not on file  . Years of education: Not on file  . Highest education level: Not on file  Social Needs  . Financial resource strain: Not on file  . Food insecurity - worry: Not on file  . Food insecurity - inability: Not on file  . Transportation needs - medical: Not on file  . Transportation needs - non-medical: Not on file  Occupational  History  . Occupation: retired  Tobacco Use  . Smoking status: Never Smoker  . Smokeless tobacco: Never Used  Substance and Sexual Activity  . Alcohol use: No  . Drug use: No  . Sexual activity: No  Other Topics Concern  . Not on file  Social History Narrative   Lives in [place] by herself.  Sister nearby.  Active.    Review of Systems: See HPI, otherwise negative ROS  Physical Exam: BP (!) 179/69   Pulse 62   Temp (!) 96.2 F (35.7 C) (Tympanic)   Resp 14   Ht 5\' 1"  (1.549 m)   Wt 53.1 kg (117 lb)   SpO2 100%   BMI 22.11 kg/m  General:   Alert,  pleasant and cooperative in NAD Head:  Normocephalic and atraumatic. Neck:  Supple; no masses or thyromegaly. Lungs:  Clear throughout to auscultation.    Heart:  Regular rate and rhythm. Abdomen:  Soft, nontender and nondistended. Normal bowel sounds, without guarding, and without rebound.   Neurologic:  Alert and  oriented x4;  grossly normal neurologically.  Impression/Plan: Lisa Hernandez is here for an endoscopy to be performed for esophageal stricture causing dysphagia.  Risks, benefits, limitations, and alternatives regarding  endoscopy have been reviewed with the patient.  Questions have been answered.  All parties agreeable.   Gaylyn Cheers, MD  11/13/2017, 7:52 AM

## 2017-11-13 NOTE — Anesthesia Preprocedure Evaluation (Signed)
Anesthesia Evaluation  Patient identified by MRN, date of birth, ID band Patient awake    Reviewed: Allergy & Precautions, NPO status , Patient's Chart, lab work & pertinent test results  History of Anesthesia Complications Negative for: history of anesthetic complications  Airway Mallampati: II  TM Distance: >3 FB Neck ROM: Full    Dental  (+) Upper Dentures, Lower Dentures   Pulmonary neg pulmonary ROS, neg sleep apnea, neg COPD,    breath sounds clear to auscultation- rhonchi (-) wheezing      Cardiovascular hypertension, Pt. on medications (-) CAD, (-) Past MI and (-) Cardiac Stents  Rhythm:Regular Rate:Normal - Systolic murmurs and - Diastolic murmurs    Neuro/Psych negative neurological ROS  negative psych ROS   GI/Hepatic Neg liver ROS, GERD  ,  Endo/Other  diabetes, Oral Hypoglycemic Agents  Renal/GU negative Renal ROS     Musculoskeletal  (+) Arthritis ,   Abdominal (+) - obese,   Peds  Hematology  (+) anemia ,   Anesthesia Other Findings Past Medical History: No date: Diabetes mellitus without complication ([facility]) No date: Dysphagia No date: Esophageal stricture     Comment:  a. s/p dilation x 7; b. 10/14/2017 EGD: benign appearing              esophageal stenosis (severe) w/ adherent scar tissue in               distal esophagus. No date: GERD (gastroesophageal reflux disease) No date: Hypertension No date: Osteoarthritis     Comment:  knees No date: Rheumatoid arthritis ([place]) No date: Thyroid nodule   Reproductive/Obstetrics                             Anesthesia Physical Anesthesia Plan  ASA: III  Anesthesia Plan: General   Post-op Pain Management:    Induction: Intravenous  PONV Risk Score and Plan: 2 and Propofol infusion  Airway Management Planned: Natural Airway  Additional Equipment:   Intra-op Plan:   Post-operative Plan:   Informed Consent: I  have reviewed the patients History and Physical, chart, labs and discussed the procedure including the risks, benefits and alternatives for the proposed anesthesia with the patient or authorized representative who has indicated his/her understanding and acceptance.   Dental advisory given  Plan Discussed with: CRNA and Anesthesiologist  Anesthesia Plan Comments:         Anesthesia Quick Evaluation

## 2017-11-13 NOTE — Anesthesia Post-op Follow-up Note (Signed)
Anesthesia QCDR form completed.        

## 2017-11-13 NOTE — Op Note (Signed)
[facility] Gastroenterology Patient Name: Lisa Hernandez Procedure Date: 11/13/2017 7:59 AM MRN: [id number] Account #: [id number] Date of Birth: 03/23/1936 Admit Type: Outpatient Age: 81 Room: [facility] ENDO ROOM 3 Gender: Female Note Status: Finalized Procedure:            Upper GI endoscopy Indications:          Dysphagia Providers:            Manya Silvas, MD Referring MD:         [name] (Referring MD) Medicines:            Propofol per Anesthesia Complications:        No immediate complications. Procedure:            Pre-Anesthesia Assessment:                       - After reviewing the risks and benefits, the patient                        was deemed in satisfactory condition to undergo the                        procedure.                       After obtaining informed consent, the endoscope was                        passed under direct vision. Throughout the procedure,                        the patient's blood pressure, pulse, and oxygen                        saturations were monitored continuously. The                        Colonoscope was introduced through the mouth, and                        advanced to the second part of duodenum. The upper GI                        endoscopy was accomplished without difficulty. The                        patient tolerated the procedure well. Findings:      A moderate Schatzki ring (acquired) was found at the gastroesophageal       junction. A guidewire was placed and the scope was withdrawn. Dilation       was performed with a Savary dilator with mild resistance at 15 mm, 16 mm       and 17 mm.      The stomach was normal.      The examined duodenum was normal. Impression:           - Moderate Schatzki ring. Dilated.                       - Normal stomach.                       -  Normal examined duodenum.                       - No specimens collected. Recommendation:       - soft food for 3 days, eat  slowly, chew well, take                        small bites Manya Silvas, MD 11/13/2017 8:16:37 AM This report has been signed electronically. Number of Addenda: 0 Note Initiated On: 11/13/2017 7:59 AM      [facility]

## 2017-11-13 NOTE — Anesthesia Postprocedure Evaluation (Signed)
Anesthesia Post Note  Patient: Lisa Hernandez  Procedure(s) Performed: ESOPHAGOGASTRODUODENOSCOPY (EGD) WITH PROPOFOL (N/A )  Patient location during evaluation: Endoscopy Anesthesia Type: General Level of consciousness: awake and alert and oriented Pain management: pain level controlled Vital Signs Assessment: post-procedure vital signs reviewed and stable Respiratory status: spontaneous breathing, nonlabored ventilation and respiratory function stable Cardiovascular status: blood pressure returned to baseline and stable Postop Assessment: no signs of nausea or vomiting Anesthetic complications: no     Last Vitals:  Vitals:   11/13/17 0820 11/13/17 0830  BP: (!) 114/47 (!) 141/81  Pulse: 72 74  Resp: 20 13  Temp: (!) 35.6 C   SpO2: 97% 100%    Last Pain:  Vitals:   11/13/17 0820  TempSrc: Tympanic                 Kevontay Burks

## 2017-11-16 ENCOUNTER — Encounter: Payer: Self-pay | Admitting: Unknown Physician Specialty

## 2017-12-10 ENCOUNTER — Inpatient Hospital Stay: Payer: Medicare Other | Attending: Oncology

## 2017-12-10 DIAGNOSIS — E538 Deficiency of other specified B group vitamins: Secondary | ICD-10-CM | POA: Diagnosis not present

## 2017-12-10 DIAGNOSIS — Z79899 Other long term (current) drug therapy: Secondary | ICD-10-CM | POA: Insufficient documentation

## 2017-12-10 DIAGNOSIS — D529 Folate deficiency anemia, unspecified: Secondary | ICD-10-CM | POA: Insufficient documentation

## 2017-12-10 DIAGNOSIS — D61811 Other drug-induced pancytopenia: Secondary | ICD-10-CM | POA: Insufficient documentation

## 2017-12-10 DIAGNOSIS — M069 Rheumatoid arthritis, unspecified: Secondary | ICD-10-CM | POA: Diagnosis not present

## 2017-12-10 MED ORDER — CYANOCOBALAMIN 1000 MCG/ML IJ SOLN
1000.0000 ug | Freq: Once | INTRAMUSCULAR | Status: AC
  Administered 2017-12-10: 1000 ug via INTRAMUSCULAR

## 2018-01-07 ENCOUNTER — Inpatient Hospital Stay: Payer: Medicare HMO | Attending: Oncology

## 2018-01-07 DIAGNOSIS — E538 Deficiency of other specified B group vitamins: Secondary | ICD-10-CM | POA: Insufficient documentation

## 2018-01-07 DIAGNOSIS — D61811 Other drug-induced pancytopenia: Secondary | ICD-10-CM | POA: Diagnosis not present

## 2018-01-07 MED ORDER — CYANOCOBALAMIN 1000 MCG/ML IJ SOLN
1000.0000 ug | Freq: Once | INTRAMUSCULAR | Status: AC
  Administered 2018-01-07: 1000 ug via INTRAMUSCULAR
  Filled 2018-01-07: qty 1

## 2018-01-22 DIAGNOSIS — M0579 Rheumatoid arthritis with rheumatoid factor of multiple sites without organ or systems involvement: Secondary | ICD-10-CM | POA: Diagnosis not present

## 2018-01-22 DIAGNOSIS — M150 Primary generalized (osteo)arthritis: Secondary | ICD-10-CM | POA: Diagnosis not present

## 2018-01-25 DIAGNOSIS — G8929 Other chronic pain: Secondary | ICD-10-CM | POA: Diagnosis not present

## 2018-01-25 DIAGNOSIS — M25562 Pain in left knee: Secondary | ICD-10-CM | POA: Diagnosis not present

## 2018-01-25 DIAGNOSIS — M150 Primary generalized (osteo)arthritis: Secondary | ICD-10-CM | POA: Diagnosis not present

## 2018-01-25 DIAGNOSIS — M0579 Rheumatoid arthritis with rheumatoid factor of multiple sites without organ or systems involvement: Secondary | ICD-10-CM | POA: Diagnosis not present

## 2018-02-11 ENCOUNTER — Inpatient Hospital Stay: Payer: Medicare HMO | Attending: Oncology | Admitting: Oncology

## 2018-02-11 ENCOUNTER — Encounter: Payer: Self-pay | Admitting: Oncology

## 2018-02-11 ENCOUNTER — Inpatient Hospital Stay: Payer: Medicare HMO

## 2018-02-11 ENCOUNTER — Inpatient Hospital Stay (HOSPITAL_BASED_OUTPATIENT_CLINIC_OR_DEPARTMENT_OTHER): Payer: Medicare HMO | Admitting: Oncology

## 2018-02-11 VITALS — BP 139/82 | HR 54 | Temp 97.8°F | Ht 61.0 in | Wt 123.0 lb

## 2018-02-11 DIAGNOSIS — R634 Abnormal weight loss: Secondary | ICD-10-CM | POA: Insufficient documentation

## 2018-02-11 DIAGNOSIS — R5381 Other malaise: Secondary | ICD-10-CM | POA: Insufficient documentation

## 2018-02-11 DIAGNOSIS — K222 Esophageal obstruction: Secondary | ICD-10-CM | POA: Diagnosis not present

## 2018-02-11 DIAGNOSIS — R5383 Other fatigue: Secondary | ICD-10-CM

## 2018-02-11 DIAGNOSIS — M069 Rheumatoid arthritis, unspecified: Secondary | ICD-10-CM | POA: Insufficient documentation

## 2018-02-11 DIAGNOSIS — K219 Gastro-esophageal reflux disease without esophagitis: Secondary | ICD-10-CM | POA: Diagnosis not present

## 2018-02-11 DIAGNOSIS — M1990 Unspecified osteoarthritis, unspecified site: Secondary | ICD-10-CM | POA: Diagnosis not present

## 2018-02-11 DIAGNOSIS — E119 Type 2 diabetes mellitus without complications: Secondary | ICD-10-CM

## 2018-02-11 DIAGNOSIS — I10 Essential (primary) hypertension: Secondary | ICD-10-CM | POA: Insufficient documentation

## 2018-02-11 DIAGNOSIS — E538 Deficiency of other specified B group vitamins: Secondary | ICD-10-CM

## 2018-02-11 DIAGNOSIS — D696 Thrombocytopenia, unspecified: Secondary | ICD-10-CM

## 2018-02-11 DIAGNOSIS — D519 Vitamin B12 deficiency anemia, unspecified: Secondary | ICD-10-CM

## 2018-02-11 DIAGNOSIS — Z79899 Other long term (current) drug therapy: Secondary | ICD-10-CM

## 2018-02-11 DIAGNOSIS — R531 Weakness: Secondary | ICD-10-CM | POA: Insufficient documentation

## 2018-02-11 DIAGNOSIS — D529 Folate deficiency anemia, unspecified: Secondary | ICD-10-CM

## 2018-02-11 DIAGNOSIS — D61811 Other drug-induced pancytopenia: Secondary | ICD-10-CM

## 2018-02-11 DIAGNOSIS — D709 Neutropenia, unspecified: Secondary | ICD-10-CM

## 2018-02-11 LAB — VITAMIN B12: Vitamin B-12: 272 pg/mL (ref 180–914)

## 2018-02-11 LAB — COMPREHENSIVE METABOLIC PANEL
ALBUMIN: 3.9 g/dL (ref 3.5–5.0)
ALK PHOS: 37 U/L — AB (ref 38–126)
ALT: 9 U/L — ABNORMAL LOW (ref 14–54)
ANION GAP: 10 (ref 5–15)
AST: 15 U/L (ref 15–41)
BUN: 22 mg/dL — ABNORMAL HIGH (ref 6–20)
CO2: 22 mmol/L (ref 22–32)
Calcium: 9.5 mg/dL (ref 8.9–10.3)
Chloride: 108 mmol/L (ref 101–111)
Creatinine, Ser: 1.08 mg/dL — ABNORMAL HIGH (ref 0.44–1.00)
GFR calc non Af Amer: 48 mL/min — ABNORMAL LOW (ref >60)
GFR, EST AFRICAN AMERICAN: 55 mL/min — AB (ref >60)
GLUCOSE: 108 mg/dL — AB (ref 65–99)
POTASSIUM: 4 mmol/L (ref 3.5–5.1)
SODIUM: 140 mmol/L (ref 135–145)
Total Bilirubin: 0.5 mg/dL (ref 0.3–1.2)
Total Protein: 7.1 g/dL (ref 6.5–8.1)

## 2018-02-11 LAB — CBC WITH DIFFERENTIAL/PLATELET
BASOS ABS: 0 10*3/uL (ref 0–0.1)
Basophils Relative: 0 %
EOS ABS: 0.1 10*3/uL (ref 0–0.7)
EOS PCT: 2 %
HCT: 35.9 % (ref 35.0–47.0)
HEMOGLOBIN: 12.4 g/dL (ref 12.0–16.0)
LYMPHS PCT: 23 %
Lymphs Abs: 1.5 10*3/uL (ref 1.0–3.6)
MCH: 31.7 pg (ref 26.0–34.0)
MCHC: 34.5 g/dL (ref 32.0–36.0)
MCV: 91.8 fL (ref 80.0–100.0)
Monocytes Absolute: 0.7 10*3/uL (ref 0.2–0.9)
Monocytes Relative: 11 %
NEUTROS PCT: 64 %
Neutro Abs: 4.2 10*3/uL (ref 1.4–6.5)
PLATELETS: 200 10*3/uL (ref 150–440)
RBC: 3.91 MIL/uL (ref 3.80–5.20)
RDW: 12.9 % (ref 11.5–14.5)
WBC: 6.6 10*3/uL (ref 3.6–11.0)

## 2018-02-11 LAB — TSH: TSH: 1.751 u[IU]/mL (ref 0.350–4.500)

## 2018-02-11 LAB — FOLATE: FOLATE: 13.6 ng/mL (ref >5.9)

## 2018-02-11 NOTE — Progress Notes (Signed)
Hematology/Oncology Follow Up Note [facility]  Telephone:([phone] [phone]  Patient Care Team: Leonel Ramsay, MD as PCP - General (Infectious Diseases)   Name of the patient: Lisa Hernandez  [id number]  05-12-1936   REASON FOR VISIT Follow up for pancytopenia.   HISTORY OF PRESENT ILLNESS This is a 82 year old female with past medical history listed as below including rheumatoid arthritis on chronic methotrexate initially presented to emergency room for evaluation of chest pain, SOB, generalized weakness.She was found to have pancytopenia. Chest pain and EKG changes, likely demand mismatch She takes low dose MTX for rheumatoid arthritis for many years. Denies any new medication or herbal supplementation.  Work up revealed folate deficiency and B12 deficiency and she was started on oral folate supplementation and IM B12 injections.  Patient denies any fever or chills. She has a history of esophageal stricture and she is status post a data dictation last week. She denies any new medications except atenolol was added to her management is recently. Denies any herbal medication or supplementation. She denies drinking any alcohol. She has some weight loss because of not being able to eat well due to the esophageal stricture. She had a bone marrow biopsy during admission.   INTERVAL HISTORY Today she follows up for evaluation and management of pancytopenia. She feels slightly fatigue, lives by herself. Appetite is fair. She feels fatigue is better. Denies any bleeding, melena or easy bruising   Review of Systems  Constitutional: Positive for malaise/fatigue. Negative for chills and fever.  HENT: Negative for ear discharge, hearing loss and nosebleeds.   Eyes: Negative for blurred vision.  Respiratory: Negative for cough.   Cardiovascular: Negative for chest pain, orthopnea and claudication.  Gastrointestinal: Negative for abdominal pain, heartburn,  nausea and vomiting.  Genitourinary: Negative for dysuria.  Musculoskeletal: Positive for joint pain. Negative for myalgias.  Skin: Negative for rash.  Neurological: Negative for dizziness, tingling and tremors.  Endo/Heme/Allergies: Does not bruise/bleed easily.  Psychiatric/Behavioral: Negative for depression, hallucinations and substance abuse.     Allergies  Allergen Reactions  . Erythromycin Ethylsuccinate [Erythromycin] Other (See Comments)    GI upset     Past Medical History:  Diagnosis Date  . Diabetes mellitus without complication ([place])   . Dysphagia   . Esophageal stricture    a. s/p dilation x 7; b. 10/14/2017 EGD: benign appearing esophageal stenosis (severe) w/ adherent scar tissue in distal esophagus.  Marland Kitchen GERD (gastroesophageal reflux disease)   . Hypertension   . Osteoarthritis    knees  . Rheumatoid arthritis ([place])   . Thyroid nodule      Past Surgical History:  Procedure Laterality Date  . ABDOMINAL HYSTERECTOMY    . adenomatous colon polyps  08/25/2016  . CHOLECYSTECTOMY    . COLONOSCOPY    . ESOPHAGOGASTRODUODENOSCOPY (EGD) WITH PROPOFOL N/A 10/03/2015   Procedure: ESOPHAGOGASTRODUODENOSCOPY (EGD) WITH PROPOFOL;  Surgeon: Manya Silvas, MD;  Location: [facility] ENDOSCOPY;  Service: Endoscopy;  Laterality: N/A;  . ESOPHAGOGASTRODUODENOSCOPY (EGD) WITH PROPOFOL N/A 10/25/2015   Procedure: ESOPHAGOGASTRODUODENOSCOPY (EGD) WITH PROPOFOL;  Surgeon: Manya Silvas, MD;  Location[facility] ENDOSCOPY;  Service: Endoscopy;  Laterality: N/A;   IDDM  . ESOPHAGOGASTRODUODENOSCOPY (EGD) WITH PROPOFOL N/A 10/27/2016   Procedure: ESOPHAGOGASTRODUODENOSCOPY (EGD) WITH PROPOFOL;  Surgeon: Manya Silvas, MD;  Location: [facility] ENDOSCOPY;  Service: Endoscopy;  Laterality: N/A;  Diabetic  . ESOPHAGOGASTRODUODENOSCOPY (EGD) WITH PROPOFOL N/A 10/14/2017   Procedure: ESOPHAGOGASTRODUODENOSCOPY (EGD) WITH PROPOFOL;  Surgeon: Gaylyn Cheers  T, MD;  Location: [facility] ENDOSCOPY;   Service: Endoscopy;  Laterality: N/A;  . ESOPHAGOGASTRODUODENOSCOPY (EGD) WITH PROPOFOL N/A 11/13/2017   Procedure: ESOPHAGOGASTRODUODENOSCOPY (EGD) WITH PROPOFOL;  Surgeon: Manya Silvas, MD;  Location: [facility] ENDOSCOPY;  Service: Endoscopy;  Laterality: N/A;  . SAVORY DILATION N/A 10/03/2015   Procedure: SAVORY DILATION;  Surgeon: Manya Silvas, MD;  Location: [facility] ENDOSCOPY;  Service: Endoscopy;  Laterality: N/A;  . SAVORY DILATION N/A 10/25/2015   Procedure: SAVORY DILATION;  Surgeon: Manya Silvas, MD;  Location: [facility] ENDOSCOPY;  Service: Endoscopy;  Laterality: N/A;  . TONSILLECTOMY      Social History   Socioeconomic History  . Marital status: Divorced    Spouse name: Not on file  . Number of children: Not on file  . Years of education: Not on file  . Highest education level: Not on file  Social Needs  . Financial resource strain: Not on file  . Food insecurity - worry: Not on file  . Food insecurity - inability: Not on file  . Transportation needs - medical: Not on file  . Transportation needs - non-medical: Not on file  Occupational History  . Occupation: retired  Tobacco Use  . Smoking status: Never Smoker  . Smokeless tobacco: Never Used  Substance and Sexual Activity  . Alcohol use: No  . Drug use: No  . Sexual activity: No  Other Topics Concern  . Not on file  Social History Narrative   Lives in [place] by herself.  Sister nearby.  Active.    Family History  Problem Relation Age of Onset  . Diabetes Mother   . Cancer Mother        died in her 65s  . Diabetes Father   . Heart attack Father        died @ 38  . Diabetes Sister      Current Outpatient Medications:  .  atenolol (TENORMIN) 25 MG tablet, Take 1 tablet 2 (two) times daily by mouth., Disp: , Rfl:  .  folic acid (FOLVITE) 1 MG tablet, Take 1 tablet (1 mg total) daily by mouth., Disp: 30 tablet, Rfl: 0 .  gabapentin (NEURONTIN) 100 MG capsule, Take 100 mg by mouth 2 (two) times daily.,  Disp: , Rfl:  .  lisinopril (PRINIVIL,ZESTRIL) 10 MG tablet, Take 10 mg 2 (two) times daily by mouth. , Disp: , Rfl:  .  meclizine (ANTIVERT) 12.5 MG tablet, Take 12.5 mg by mouth 3 (three) times daily as needed for dizziness., Disp: , Rfl:  .  metFORMIN (GLUCOPHAGE) 500 MG tablet, Take 500 mg by mouth. Take 2 tabs in am, 1 tab in pm, Disp: , Rfl:  .  ONETOUCH DELICA LANCETS 16X MISC, 3 daily, Disp: , Rfl:  .  pantoprazole (PROTONIX) 20 MG tablet, Take 20 mg by mouth daily., Disp: , Rfl:   Physical exam:  Vitals:   02/11/18 1337  BP: 139/82  Pulse: (!) 54  Temp: 97.8 F (36.6 C)  TempSrc: Tympanic  Weight: 123 lb (55.8 kg)  Height: '5\' 1"'  (1.549 m)   GENERAL: No distress, well nourished.  SKIN:  No rashes or significant lesions  HEAD: Normocephalic, No masses, lesions, tenderness or abnormalities  EYES: Conjunctiva are pink, non icteric ENT: External ears normal ,lips ,  LYMPH: No palpable cervical and axillary lymphadenopathy  LUNGS: Clear to auscultation, no crackles or wheezes HEART: Regular rate & rhythm, no murmurs, no gallops, S1 normal and S2 normal  ABDOMEN: Abdomen soft, non-tender,  normal bowel sounds, I did not appreciate any  masses or organomegaly  MUSCULOSKELETAL: No CVA tenderness and no tenderness on percussion of the back or rib cage.  EXTREMITIES: No edema, no skin discoloration or tenderness NEURO: Alert & oriented, no focal motor/sensory deficits.   CMP Latest Ref Rng & Units 10/29/2017  Glucose 65 - 99 mg/dL 136(H)  BUN 6 - 20 mg/dL 13  Creatinine 0.44 - 1.00 mg/dL 1.26(H)  Sodium 135 - 145 mmol/L 137  Potassium 3.5 - 5.1 mmol/L 3.9  Chloride 101 - 111 mmol/L 103  CO2 22 - 32 mmol/L 26  Calcium 8.9 - 10.3 mg/dL 9.0  Total Protein 6.5 - 8.1 g/dL 6.0(L)  Total Bilirubin 0.3 - 1.2 mg/dL 1.8(H)  Alkaline Phos 38 - 126 U/L 54  AST 15 - 41 U/L 24  ALT 14 - 54 U/L 11(L)   CBC Latest Ref Rng & Units 02/11/2018  WBC 3.6 - 11.0 K/uL 6.6  Hemoglobin 12.0 -  16.0 g/dL 12.4  Hematocrit 35.0 - 47.0 % 35.9  Platelets 150 - 440 K/uL 200     Bone marrow biopsy pathology 10/26/2017 Diagnosis Bone Marrow, Aspirate,Biopsy, and Clot, left iliac - HYPERCELLULAR BONE MARROW WITH DYSPOIETIC CHANGES. - SEE COMMENT. PERIPHERAL BLOOD: - PANCYTOPENIA. Diagnosis Note The bone marrow is hypercellular with trilineage hematopoiesis but with relative abundance of erythroid precursors. Myeloid cell lines display dyspoietic changes including megaloblastoid changes but with no increase in blastic cells. I suspect that the changes are likely related to the patients known low vitamin B12 and folate levels in addition to methotrexate treatment. In this setting, it is extremely difficult to assess for the presence or absence of myelodysplastic syndrome. Re-evaluation after vitamin B12 and folate treatment is recommended. Cytogenetic studies may also be helpful in this regard. (BNS:gt, 10/27/17)   Assessment and plan Patient is a 82 y.o. female who is on low-dose methotrexate chronically presented with pancytopenia, chest pain and shortness of breath. 1. B12 deficiency   2. Anemia due to vitamin B12 deficiency, unspecified B12 deficiency type    # Pancytopenia resolved. Normal hemoglobin and platelet counts.  # normal B12 level.  Plan B12 injection monthly. Continue folic acid 8.1WE daily.   Follow up in 3 months with repeat cbc, cmp, b12,folate  Earlie Server, MD, PhD Hematology Oncology [facility] Pager- [phone] 02/11/2018

## 2018-02-11 NOTE — Progress Notes (Signed)
No new changes noted today 

## 2018-02-12 ENCOUNTER — Other Ambulatory Visit: Payer: Self-pay | Admitting: *Deleted

## 2018-02-12 DIAGNOSIS — D709 Neutropenia, unspecified: Secondary | ICD-10-CM

## 2018-02-12 DIAGNOSIS — D696 Thrombocytopenia, unspecified: Secondary | ICD-10-CM

## 2018-02-12 MED ORDER — FOLIC ACID 1 MG PO TABS: 1.0000 mg | ORAL_TABLET | Freq: Every day | ORAL | 3 refills | Status: DC

## 2018-02-12 NOTE — Progress Notes (Signed)
error 

## 2018-03-08 ENCOUNTER — Inpatient Hospital Stay: Payer: Medicare HMO | Attending: Oncology

## 2018-03-08 DIAGNOSIS — E538 Deficiency of other specified B group vitamins: Secondary | ICD-10-CM

## 2018-03-08 MED ORDER — CYANOCOBALAMIN 1000 MCG/ML IJ SOLN
1000.0000 ug | Freq: Once | INTRAMUSCULAR | Status: AC
  Administered 2018-03-08: 1000 ug via INTRAMUSCULAR

## 2018-04-05 ENCOUNTER — Inpatient Hospital Stay: Payer: Medicare HMO | Attending: Oncology

## 2018-04-05 DIAGNOSIS — D519 Vitamin B12 deficiency anemia, unspecified: Secondary | ICD-10-CM | POA: Diagnosis not present

## 2018-04-05 DIAGNOSIS — D529 Folate deficiency anemia, unspecified: Secondary | ICD-10-CM | POA: Diagnosis not present

## 2018-04-05 DIAGNOSIS — E538 Deficiency of other specified B group vitamins: Secondary | ICD-10-CM

## 2018-04-05 MED ORDER — CYANOCOBALAMIN 1000 MCG/ML IJ SOLN
1000.0000 ug | Freq: Once | INTRAMUSCULAR | Status: AC
  Administered 2018-04-05: 1000 ug via INTRAMUSCULAR

## 2018-04-22 DIAGNOSIS — M150 Primary generalized (osteo)arthritis: Secondary | ICD-10-CM | POA: Diagnosis not present

## 2018-04-22 DIAGNOSIS — M0579 Rheumatoid arthritis with rheumatoid factor of multiple sites without organ or systems involvement: Secondary | ICD-10-CM | POA: Diagnosis not present

## 2018-04-27 DIAGNOSIS — I10 Essential (primary) hypertension: Secondary | ICD-10-CM | POA: Diagnosis not present

## 2018-04-27 DIAGNOSIS — Z0000 Encounter for general adult medical examination without abnormal findings: Secondary | ICD-10-CM | POA: Diagnosis not present

## 2018-04-27 DIAGNOSIS — K219 Gastro-esophageal reflux disease without esophagitis: Secondary | ICD-10-CM | POA: Diagnosis not present

## 2018-04-27 DIAGNOSIS — K222 Esophageal obstruction: Secondary | ICD-10-CM | POA: Diagnosis not present

## 2018-04-27 DIAGNOSIS — I509 Heart failure, unspecified: Secondary | ICD-10-CM | POA: Diagnosis not present

## 2018-04-27 DIAGNOSIS — R1310 Dysphagia, unspecified: Secondary | ICD-10-CM | POA: Diagnosis not present

## 2018-04-27 DIAGNOSIS — E119 Type 2 diabetes mellitus without complications: Secondary | ICD-10-CM | POA: Diagnosis not present

## 2018-04-27 DIAGNOSIS — I429 Cardiomyopathy, unspecified: Secondary | ICD-10-CM | POA: Diagnosis not present

## 2018-04-28 DIAGNOSIS — M25561 Pain in right knee: Secondary | ICD-10-CM | POA: Diagnosis not present

## 2018-04-28 DIAGNOSIS — M150 Primary generalized (osteo)arthritis: Secondary | ICD-10-CM | POA: Diagnosis not present

## 2018-04-28 DIAGNOSIS — M0579 Rheumatoid arthritis with rheumatoid factor of multiple sites without organ or systems involvement: Secondary | ICD-10-CM | POA: Diagnosis not present

## 2018-04-28 DIAGNOSIS — M25562 Pain in left knee: Secondary | ICD-10-CM | POA: Diagnosis not present

## 2018-04-28 DIAGNOSIS — G8929 Other chronic pain: Secondary | ICD-10-CM | POA: Diagnosis not present

## 2018-05-06 ENCOUNTER — Other Ambulatory Visit: Payer: Self-pay | Admitting: Oncology

## 2018-05-06 DIAGNOSIS — D709 Neutropenia, unspecified: Secondary | ICD-10-CM

## 2018-05-06 DIAGNOSIS — D696 Thrombocytopenia, unspecified: Secondary | ICD-10-CM

## 2018-05-07 ENCOUNTER — Inpatient Hospital Stay: Payer: Medicare HMO | Attending: Oncology

## 2018-05-07 ENCOUNTER — Other Ambulatory Visit: Payer: Self-pay | Admitting: *Deleted

## 2018-05-07 DIAGNOSIS — K222 Esophageal obstruction: Secondary | ICD-10-CM | POA: Insufficient documentation

## 2018-05-07 DIAGNOSIS — N189 Chronic kidney disease, unspecified: Secondary | ICD-10-CM | POA: Insufficient documentation

## 2018-05-07 DIAGNOSIS — D519 Vitamin B12 deficiency anemia, unspecified: Secondary | ICD-10-CM

## 2018-05-07 DIAGNOSIS — M069 Rheumatoid arthritis, unspecified: Secondary | ICD-10-CM | POA: Diagnosis not present

## 2018-05-07 DIAGNOSIS — E1122 Type 2 diabetes mellitus with diabetic chronic kidney disease: Secondary | ICD-10-CM | POA: Diagnosis not present

## 2018-05-07 DIAGNOSIS — D529 Folate deficiency anemia, unspecified: Secondary | ICD-10-CM | POA: Diagnosis not present

## 2018-05-07 DIAGNOSIS — K219 Gastro-esophageal reflux disease without esophagitis: Secondary | ICD-10-CM | POA: Diagnosis not present

## 2018-05-07 DIAGNOSIS — R634 Abnormal weight loss: Secondary | ICD-10-CM | POA: Insufficient documentation

## 2018-05-07 DIAGNOSIS — I129 Hypertensive chronic kidney disease with stage 1 through stage 4 chronic kidney disease, or unspecified chronic kidney disease: Secondary | ICD-10-CM | POA: Insufficient documentation

## 2018-05-07 DIAGNOSIS — D61818 Other pancytopenia: Secondary | ICD-10-CM | POA: Diagnosis not present

## 2018-05-07 DIAGNOSIS — R5382 Chronic fatigue, unspecified: Secondary | ICD-10-CM | POA: Insufficient documentation

## 2018-05-07 DIAGNOSIS — R5381 Other malaise: Secondary | ICD-10-CM | POA: Insufficient documentation

## 2018-05-07 DIAGNOSIS — Z7984 Long term (current) use of oral hypoglycemic drugs: Secondary | ICD-10-CM | POA: Diagnosis not present

## 2018-05-07 DIAGNOSIS — Z79899 Other long term (current) drug therapy: Secondary | ICD-10-CM | POA: Insufficient documentation

## 2018-05-07 LAB — CBC WITH DIFFERENTIAL/PLATELET
Basophils Absolute: 0.2 10*3/uL — ABNORMAL HIGH (ref 0–0.1)
Basophils Relative: 3 %
EOS PCT: 2 %
Eosinophils Absolute: 0.1 10*3/uL (ref 0–0.7)
HCT: 33.3 % — ABNORMAL LOW (ref 35.0–47.0)
HEMOGLOBIN: 11.7 g/dL — AB (ref 12.0–16.0)
LYMPHS ABS: 1.6 10*3/uL (ref 1.0–3.6)
LYMPHS PCT: 23 %
MCH: 31.7 pg (ref 26.0–34.0)
MCHC: 35.2 g/dL (ref 32.0–36.0)
MCV: 90.1 fL (ref 80.0–100.0)
Monocytes Absolute: 0.6 10*3/uL (ref 0.2–0.9)
Monocytes Relative: 9 %
Neutro Abs: 4.4 10*3/uL (ref 1.4–6.5)
Neutrophils Relative %: 63 %
PLATELETS: 239 10*3/uL (ref 150–440)
RBC: 3.69 MIL/uL — AB (ref 3.80–5.20)
RDW: 14.1 % (ref 11.5–14.5)
WBC: 6.8 10*3/uL (ref 3.6–11.0)

## 2018-05-07 LAB — COMPREHENSIVE METABOLIC PANEL
ALBUMIN: 3.8 g/dL (ref 3.5–5.0)
ALT: 9 U/L — ABNORMAL LOW (ref 14–54)
AST: 21 U/L (ref 15–41)
Alkaline Phosphatase: 37 U/L — ABNORMAL LOW (ref 38–126)
Anion gap: 10 (ref 5–15)
BUN: 19 mg/dL (ref 6–20)
CHLORIDE: 106 mmol/L (ref 101–111)
CO2: 25 mmol/L (ref 22–32)
Calcium: 8.9 mg/dL (ref 8.9–10.3)
Creatinine, Ser: 1.31 mg/dL — ABNORMAL HIGH (ref 0.44–1.00)
GFR calc Af Amer: 43 mL/min — ABNORMAL LOW (ref >60)
GFR calc non Af Amer: 37 mL/min — ABNORMAL LOW (ref >60)
GLUCOSE: 139 mg/dL — AB (ref 65–99)
POTASSIUM: 3.4 mmol/L — AB (ref 3.5–5.1)
SODIUM: 141 mmol/L (ref 135–145)
Total Bilirubin: 1.1 mg/dL (ref 0.3–1.2)
Total Protein: 6.7 g/dL (ref 6.5–8.1)

## 2018-05-07 LAB — VITAMIN B12: VITAMIN B 12: 498 pg/mL (ref 180–914)

## 2018-05-08 LAB — FOLATE: FOLATE: 80 ng/mL (ref >5.9)

## 2018-05-13 ENCOUNTER — Inpatient Hospital Stay: Payer: Medicare HMO

## 2018-05-13 ENCOUNTER — Encounter: Payer: Self-pay | Admitting: Oncology

## 2018-05-13 ENCOUNTER — Inpatient Hospital Stay (HOSPITAL_BASED_OUTPATIENT_CLINIC_OR_DEPARTMENT_OTHER): Payer: Medicare HMO | Admitting: Oncology

## 2018-05-13 VITALS — BP 132/67 | HR 64 | Temp 98.1°F | Resp 16 | Wt 119.0 lb

## 2018-05-13 DIAGNOSIS — K222 Esophageal obstruction: Secondary | ICD-10-CM

## 2018-05-13 DIAGNOSIS — E538 Deficiency of other specified B group vitamins: Secondary | ICD-10-CM

## 2018-05-13 DIAGNOSIS — R5382 Chronic fatigue, unspecified: Secondary | ICD-10-CM | POA: Diagnosis not present

## 2018-05-13 DIAGNOSIS — D529 Folate deficiency anemia, unspecified: Secondary | ICD-10-CM

## 2018-05-13 DIAGNOSIS — K219 Gastro-esophageal reflux disease without esophagitis: Secondary | ICD-10-CM

## 2018-05-13 DIAGNOSIS — Z7984 Long term (current) use of oral hypoglycemic drugs: Secondary | ICD-10-CM

## 2018-05-13 DIAGNOSIS — D519 Vitamin B12 deficiency anemia, unspecified: Secondary | ICD-10-CM | POA: Diagnosis not present

## 2018-05-13 DIAGNOSIS — M069 Rheumatoid arthritis, unspecified: Secondary | ICD-10-CM

## 2018-05-13 DIAGNOSIS — D61818 Other pancytopenia: Secondary | ICD-10-CM

## 2018-05-13 DIAGNOSIS — N189 Chronic kidney disease, unspecified: Secondary | ICD-10-CM | POA: Diagnosis not present

## 2018-05-13 DIAGNOSIS — Z79899 Other long term (current) drug therapy: Secondary | ICD-10-CM

## 2018-05-13 DIAGNOSIS — R5381 Other malaise: Secondary | ICD-10-CM | POA: Diagnosis not present

## 2018-05-13 DIAGNOSIS — I129 Hypertensive chronic kidney disease with stage 1 through stage 4 chronic kidney disease, or unspecified chronic kidney disease: Secondary | ICD-10-CM

## 2018-05-13 DIAGNOSIS — R634 Abnormal weight loss: Secondary | ICD-10-CM

## 2018-05-13 DIAGNOSIS — D709 Neutropenia, unspecified: Secondary | ICD-10-CM

## 2018-05-13 DIAGNOSIS — D696 Thrombocytopenia, unspecified: Secondary | ICD-10-CM

## 2018-05-13 DIAGNOSIS — E1122 Type 2 diabetes mellitus with diabetic chronic kidney disease: Secondary | ICD-10-CM

## 2018-05-13 MED ORDER — CYANOCOBALAMIN 1000 MCG/ML IJ SOLN
1000.0000 ug | Freq: Once | INTRAMUSCULAR | Status: AC
  Administered 2018-05-13: 1000 ug via INTRAMUSCULAR
  Filled 2018-05-13: qty 1

## 2018-05-16 NOTE — Progress Notes (Signed)
Hematology/Oncology Follow Up Note [facility]  [phone] [phone]  Patient Care Team: Leonel Ramsay, MD as PCP - General (Infectious Diseases)   Name of the patient: Lisa Hernandez  [id number]  [birth year]   REASON FOR VISIT Follow up for pancytopenia.   HISTORY OF PRESENT ILLNESS This is a 82 year old female with past medical history listed as below including rheumatoid arthritis on chronic methotrexate initially presented to emergency room for evaluation of chest pain, SOB, generalized weakness.She was found to have pancytopenia. Chest pain and EKG changes, likely demand mismatch She takes low dose MTX for rheumatoid arthritis for many years. Denies any new medication or herbal supplementation.  Work up revealed folate deficiency and B12 deficiency and she was started on oral folate supplementation and IM B12 injections.  Patient denies any fever or chills. She has a history of esophageal stricture and she is status post a data dictation last week. She denies any new medications except atenolol was added to her management is recently. Denies any herbal medication or supplementation. She denies drinking any alcohol. She has some weight loss because of not being able to eat well due to the esophageal stricture. She had a bone marrow biopsy during admission.   INTERVAL HISTORY Today she follows up for evaluation and management of pancytopenia. Patient reports feeling at baseline. Mild chronic fatigue.  Appetite is fair. She has chronic intermittent dysphagia due to recurrent esophageal stricture.   Denies any bleeding, melena or easy bruising   Review of Systems  Constitutional: Positive for malaise/fatigue. Negative for chills, fever and weight loss.  HENT: Negative for congestion, ear discharge, ear pain, hearing loss, nosebleeds, sinus pain and sore throat.   Eyes: Negative for blurred vision, double vision, photophobia, pain,  discharge and redness.  Respiratory: Negative for cough, hemoptysis, sputum production, shortness of breath and wheezing.   Cardiovascular: Negative for chest pain, palpitations, orthopnea, claudication and leg swelling.  Gastrointestinal: Negative for abdominal pain, blood in stool, constipation, diarrhea, heartburn, melena, nausea and vomiting.  Genitourinary: Negative for dysuria, flank pain, frequency and hematuria.  Musculoskeletal: Positive for joint pain. Negative for back pain, myalgias and neck pain.  Skin: Negative for itching and rash.  Neurological: Negative for dizziness, tingling, tremors, focal weakness, weakness and headaches.  Endo/Heme/Allergies: Negative for environmental allergies. Does not bruise/bleed easily.  Psychiatric/Behavioral: Negative for depression, hallucinations and substance abuse. The patient is not nervous/anxious.      Allergies  Allergen Reactions  . Erythromycin Ethylsuccinate [Erythromycin] Other (See Comments)    GI upset     Past Medical History:  Diagnosis Date  . Diabetes mellitus without complication ([place])   . Dysphagia   . Esophageal stricture    a. s/p dilation x 7; b. 10/14/2017 EGD: benign appearing esophageal stenosis (severe) w/ adherent scar tissue in distal esophagus.  Marland Kitchen GERD (gastroesophageal reflux disease)   . Hypertension   . Osteoarthritis    knees  . Rheumatoid arthritis ([place])   . Thyroid nodule      Past Surgical History:  Procedure Laterality Date  . ABDOMINAL HYSTERECTOMY    . adenomatous colon polyps  08/25/2016  . CHOLECYSTECTOMY    . COLONOSCOPY    . ESOPHAGOGASTRODUODENOSCOPY (EGD) WITH PROPOFOL N/A 10/03/2015   Procedure: ESOPHAGOGASTRODUODENOSCOPY (EGD) WITH PROPOFOL;  Surgeon: Manya Silvas, MD;  Location: [facility] ENDOSCOPY;  Service: Endoscopy;  Laterality: N/A;  . ESOPHAGOGASTRODUODENOSCOPY (EGD) WITH PROPOFOL N/A 10/25/2015   Procedure: ESOPHAGOGASTRODUODENOSCOPY (EGD) WITH PROPOFOL;  Surgeon: Herbie Baltimore  Federico Flake, MD;  Location: [facility] ENDOSCOPY;  Service: Endoscopy;  Laterality: N/A;   IDDM  . ESOPHAGOGASTRODUODENOSCOPY (EGD) WITH PROPOFOL N/A 10/27/2016   Procedure: ESOPHAGOGASTRODUODENOSCOPY (EGD) WITH PROPOFOL;  Surgeon: Manya Silvas, MD;  Location: [facility] ENDOSCOPY;  Service: Endoscopy;  Laterality: N/A;  Diabetic  . ESOPHAGOGASTRODUODENOSCOPY (EGD) WITH PROPOFOL N/A 10/14/2017   Procedure: ESOPHAGOGASTRODUODENOSCOPY (EGD) WITH PROPOFOL;  Surgeon: Manya Silvas, MD;  Location: [facility] ENDOSCOPY;  Service: Endoscopy;  Laterality: N/A;  . ESOPHAGOGASTRODUODENOSCOPY (EGD) WITH PROPOFOL N/A 11/13/2017   Procedure: ESOPHAGOGASTRODUODENOSCOPY (EGD) WITH PROPOFOL;  Surgeon: Manya Silvas, MD;  Location: [facility] ENDOSCOPY;  Service: Endoscopy;  Laterality: N/A;  . SAVORY DILATION N/A 10/03/2015   Procedure: SAVORY DILATION;  Surgeon: Manya Silvas, MD;  Location: [facility] ENDOSCOPY;  Service: Endoscopy;  Laterality: N/A;  . SAVORY DILATION N/A 10/25/2015   Procedure: SAVORY DILATION;  Surgeon: Manya Silvas, MD;  Location: [facility] ENDOSCOPY;  Service: Endoscopy;  Laterality: N/A;  . TONSILLECTOMY      Social History   Socioeconomic History  . Marital status: Divorced    Spouse name: Not on file  . Number of children: Not on file  . Years of education: Not on file  . Highest education level: Not on file  Occupational History  . Occupation: retired  Scientific laboratory technician  . Financial resource strain: Not on file  . Food insecurity:    Worry: Not on file    Inability: Not on file  . Transportation needs:    Medical: Not on file    Non-medical: Not on file  Tobacco Use  . Smoking status: Never Smoker  . Smokeless tobacco: Never Used  Substance and Sexual Activity  . Alcohol use: No  . Drug use: No  . Sexual activity: Never  Lifestyle  . Physical activity:    Days per week: Not on file    Minutes per session: Not on file  . Stress: Not on file  Relationships  . Social connections:     Talks on phone: Not on file    Gets together: Not on file    Attends religious service: Not on file    Active member of club or organization: Not on file    Attends meetings of clubs or organizations: Not on file    Relationship status: Not on file  . Intimate partner violence:    Fear of current or ex partner: Not on file    Emotionally abused: Not on file    Physically abused: Not on file    Forced sexual activity: Not on file  Other Topics Concern  . Not on file  Social History Narrative   Lives in [place] by herself.  Sister nearby.  Active.    Family History  Problem Relation Age of Onset  . Diabetes Mother   . Cancer Mother        died in her 61s  . Diabetes Father   . Heart attack Father        died @ 48  . Diabetes Sister      Current Outpatient Medications:  .  atenolol (TENORMIN) 25 MG tablet, Take 1 tablet 2 (two) times daily by mouth., Disp: , Rfl:  .  folic acid (FOLVITE) 1 MG tablet, TAKE 1 TABLET BY MOUTH EVERY DAY, Disp: 30 tablet, Rfl: 2 .  furosemide (LASIX) 20 MG tablet, TAKE ONE (1) TABLET EACH DAY, Disp: , Rfl:  .  gabapentin (NEURONTIN) 100 MG capsule, Take 100 mg by  mouth 2 (two) times daily., Disp: , Rfl:  .  lisinopril (PRINIVIL,ZESTRIL) 10 MG tablet, Take 10 mg 2 (two) times daily by mouth. , Disp: , Rfl:  .  meclizine (ANTIVERT) 12.5 MG tablet, Take 12.5 mg by mouth 3 (three) times daily as needed for dizziness., Disp: , Rfl:  .  metFORMIN (GLUCOPHAGE) 500 MG tablet, Take 500 mg by mouth. Take 2 tabs in am, 1 tab in pm, Disp: , Rfl:  .  ONETOUCH DELICA LANCETS 19F MISC, 3 daily, Disp: , Rfl:  .  pantoprazole (PROTONIX) 20 MG tablet, Take 20 mg by mouth daily., Disp: , Rfl:  .  potassium chloride (K-DUR) 10 MEQ tablet, TAKE 1 TABLET (10 MEQ TOTAL) BY MOUTH ONCE DAILY, Disp: , Rfl: 11 .  traZODone (DESYREL) 50 MG tablet, TAKE 1 TABLET (50 MG TOTAL) BY MOUTH NIGHTLY AS NEEDED FOR SLEEP, Disp: , Rfl: 3  Physical exam:  Vitals:   05/13/18 1517  BP:  132/67  Pulse: 64  Resp: 16  Temp: 98.1 F (36.7 C)  TempSrc: Tympanic  Weight: 119 lb (54 kg)   Physical Exam  Constitutional: She is oriented to person, place, and time and well-developed, well-nourished, and in no distress. No distress.  HENT:  Head: Normocephalic and atraumatic.  Nose: Nose normal.  Mouth/Throat: Oropharynx is clear and moist. No oropharyngeal exudate.  Eyes: Pupils are equal, round, and reactive to light. EOM are normal. Left eye exhibits no discharge. No scleral icterus.  Neck: Normal range of motion. Neck supple. No JVD present.  Cardiovascular: Normal rate, regular rhythm and normal heart sounds.  No murmur heard. Pulmonary/Chest: Effort normal and breath sounds normal. No respiratory distress. She has no wheezes. She has no rales. She exhibits no tenderness.  Abdominal: Soft. She exhibits no distension and no mass. There is no tenderness. There is no rebound.  Musculoskeletal: Normal range of motion. She exhibits no edema or tenderness.  Lymphadenopathy:    She has no cervical adenopathy.  Neurological: She is alert and oriented to person, place, and time. No cranial nerve deficit. She exhibits normal muscle tone. Coordination normal.  Skin: Skin is warm and dry. No rash noted. She is not diaphoretic. No erythema.  Psychiatric: Affect and judgment normal.  .   CMP Latest Ref Rng & Units 05/07/2018  Glucose 65 - 99 mg/dL 139(H)  BUN 6 - 20 mg/dL 19  Creatinine 0.44 - 1.00 mg/dL 1.31(H)  Sodium 135 - 145 mmol/L 141  Potassium 3.5 - 5.1 mmol/L 3.4(L)  Chloride 101 - 111 mmol/L 106  CO2 22 - 32 mmol/L 25  Calcium 8.9 - 10.3 mg/dL 8.9  Total Protein 6.5 - 8.1 g/dL 6.7  Total Bilirubin 0.3 - 1.2 mg/dL 1.1  Alkaline Phos 38 - 126 U/L 37(L)  AST 15 - 41 U/L 21  ALT 14 - 54 U/L 9(L)   CBC Latest Ref Rng & Units 05/07/2018  WBC 3.6 - 11.0 K/uL 6.8  Hemoglobin 12.0 - 16.0 g/dL 11.7(L)  Hematocrit 35.0 - 47.0 % 33.3(L)  Platelets 150 - 440 K/uL 239      Bone marrow biopsy pathology 10/26/2017 Diagnosis Bone Marrow, Aspirate,Biopsy, and Clot, left iliac - HYPERCELLULAR BONE MARROW WITH DYSPOIETIC CHANGES. - SEE COMMENT. PERIPHERAL BLOOD: - PANCYTOPENIA. Diagnosis Note The bone marrow is hypercellular with trilineage hematopoiesis but with relative abundance of erythroid precursors. Myeloid cell lines display dyspoietic changes including megaloblastoid changes but with no increase in blastic cells. I suspect that the changes are likely  related to the patients known low vitamin B12 and folate levels in addition to methotrexate treatment. In this setting, it is extremely difficult to assess for the presence or absence of myelodysplastic syndrome. Re-evaluation after vitamin B12 and folate treatment is recommended. Cytogenetic studies may also be helpful in this regard. (BNS:gt, 10/27/17)   Assessment and plan Patient is a 82 y.o. female who is on low-dose methotrexate chronically presented with pancytopenia, chest pain and shortness of breath. 1. Anemia due to vitamin B12 deficiency, unspecified B12 deficiency type   2. B12 deficiency   3. Neutropenia, unspecified type ([place])   4. Thrombocytopenia ([place])   5. Anemia due to folic acid deficiency, unspecified deficiency type   6. Pancytopenia ([place])    # Pancytopenia stable. Her hemoglobin level decreased slightly. Anemia can also be secondary to CKD.  # B 12 deficiency :B12 level also decreased from 6 months ago but better after B21 injection 3 months ago. Will proceed B12 1045mg injection today.  Her intrinsic factor is positive, possible pernicious anemia. Advise patient to continue follow up with GI.  # Neutropenia resolved.  # Folic deficiency: resolved. Continue folic acid 0.4 mg daily.  # Thrombocytopenia resolved.   Follow up in 3 months with repeat cbc, cmp, b12,folate  ZEarlie Server MD, PhD Hematology Oncology [facility], ThePager-  [phone]

## 2018-05-19 DIAGNOSIS — R1310 Dysphagia, unspecified: Secondary | ICD-10-CM | POA: Diagnosis not present

## 2018-05-21 DIAGNOSIS — M069 Rheumatoid arthritis, unspecified: Secondary | ICD-10-CM | POA: Diagnosis not present

## 2018-05-21 DIAGNOSIS — Z01818 Encounter for other preprocedural examination: Secondary | ICD-10-CM | POA: Diagnosis not present

## 2018-05-21 DIAGNOSIS — I10 Essential (primary) hypertension: Secondary | ICD-10-CM | POA: Diagnosis not present

## 2018-05-21 DIAGNOSIS — N189 Chronic kidney disease, unspecified: Secondary | ICD-10-CM | POA: Diagnosis not present

## 2018-05-21 DIAGNOSIS — I429 Cardiomyopathy, unspecified: Secondary | ICD-10-CM | POA: Diagnosis not present

## 2018-05-21 DIAGNOSIS — I208 Other forms of angina pectoris: Secondary | ICD-10-CM | POA: Diagnosis not present

## 2018-05-21 DIAGNOSIS — I509 Heart failure, unspecified: Secondary | ICD-10-CM | POA: Diagnosis not present

## 2018-05-21 DIAGNOSIS — K219 Gastro-esophageal reflux disease without esophagitis: Secondary | ICD-10-CM | POA: Diagnosis not present

## 2018-05-21 DIAGNOSIS — R0609 Other forms of dyspnea: Secondary | ICD-10-CM | POA: Diagnosis not present

## 2018-05-24 ENCOUNTER — Ambulatory Visit
Admission: RE | Admit: 2018-05-24 | Discharge: 2018-05-24 | Disposition: A | Discharge status: Home / Self Care | Payer: Medicare HMO | Source: Ambulatory Visit | Attending: Unknown Physician Specialty | Admitting: Unknown Physician Specialty

## 2018-05-24 ENCOUNTER — Encounter: Payer: Self-pay | Admitting: *Deleted

## 2018-05-24 ENCOUNTER — Ambulatory Visit: Payer: Medicare HMO | Admitting: Certified Registered Nurse Anesthetist

## 2018-05-24 ENCOUNTER — Encounter
Admission: RE | Disposition: A | Discharge status: Home / Self Care | Payer: Self-pay | Source: Ambulatory Visit | Attending: Unknown Physician Specialty

## 2018-05-24 DIAGNOSIS — Z833 Family history of diabetes mellitus: Secondary | ICD-10-CM | POA: Diagnosis not present

## 2018-05-24 DIAGNOSIS — K219 Gastro-esophageal reflux disease without esophagitis: Secondary | ICD-10-CM | POA: Diagnosis not present

## 2018-05-24 DIAGNOSIS — E119 Type 2 diabetes mellitus without complications: Secondary | ICD-10-CM | POA: Insufficient documentation

## 2018-05-24 DIAGNOSIS — R1319 Other dysphagia: Secondary | ICD-10-CM | POA: Diagnosis not present

## 2018-05-24 DIAGNOSIS — K209 Esophagitis, unspecified: Secondary | ICD-10-CM | POA: Diagnosis not present

## 2018-05-24 DIAGNOSIS — Z86010 Personal history of colonic polyps: Secondary | ICD-10-CM | POA: Insufficient documentation

## 2018-05-24 DIAGNOSIS — Z881 Allergy status to other antibiotic agents status: Secondary | ICD-10-CM | POA: Diagnosis not present

## 2018-05-24 DIAGNOSIS — D61818 Other pancytopenia: Secondary | ICD-10-CM | POA: Insufficient documentation

## 2018-05-24 DIAGNOSIS — E041 Nontoxic single thyroid nodule: Secondary | ICD-10-CM | POA: Insufficient documentation

## 2018-05-24 DIAGNOSIS — Z7984 Long term (current) use of oral hypoglycemic drugs: Secondary | ICD-10-CM | POA: Diagnosis not present

## 2018-05-24 DIAGNOSIS — M069 Rheumatoid arthritis, unspecified: Secondary | ICD-10-CM | POA: Insufficient documentation

## 2018-05-24 DIAGNOSIS — K210 Gastro-esophageal reflux disease with esophagitis: Secondary | ICD-10-CM | POA: Diagnosis not present

## 2018-05-24 DIAGNOSIS — Z538 Procedure and treatment not carried out for other reasons: Secondary | ICD-10-CM | POA: Diagnosis not present

## 2018-05-24 DIAGNOSIS — I10 Essential (primary) hypertension: Secondary | ICD-10-CM | POA: Diagnosis not present

## 2018-05-24 DIAGNOSIS — M1990 Unspecified osteoarthritis, unspecified site: Secondary | ICD-10-CM | POA: Insufficient documentation

## 2018-05-24 DIAGNOSIS — Z79899 Other long term (current) drug therapy: Secondary | ICD-10-CM | POA: Insufficient documentation

## 2018-05-24 DIAGNOSIS — K222 Esophageal obstruction: Secondary | ICD-10-CM | POA: Diagnosis not present

## 2018-05-24 DIAGNOSIS — Z808 Family history of malignant neoplasm of other organs or systems: Secondary | ICD-10-CM | POA: Diagnosis not present

## 2018-05-24 DIAGNOSIS — R1310 Dysphagia, unspecified: Secondary | ICD-10-CM | POA: Diagnosis not present

## 2018-05-24 HISTORY — PX: ESOPHAGOGASTRODUODENOSCOPY (EGD) WITH PROPOFOL: SHX5813

## 2018-05-24 LAB — GLUCOSE, CAPILLARY: GLUCOSE-CAPILLARY: 74 mg/dL (ref 65–99)

## 2018-05-24 SURGERY — ESOPHAGOGASTRODUODENOSCOPY (EGD) WITH PROPOFOL
Anesthesia: General

## 2018-05-24 MED ORDER — SODIUM CHLORIDE 0.9 % IV SOLN: INTRAVENOUS | Status: DC

## 2018-05-24 MED ORDER — DEXTROSE 5 % IV SOLN
INTRAVENOUS | Status: DC
  Administered 2018-05-24: 14:00:00 via INTRAVENOUS

## 2018-05-24 MED ORDER — PROPOFOL 10 MG/ML IV BOLUS
INTRAVENOUS | Status: DC | PRN
  Administered 2018-05-24: 10 mg via INTRAVENOUS
  Administered 2018-05-24: 80 mg via INTRAVENOUS
  Administered 2018-05-24: 40 mg via INTRAVENOUS

## 2018-05-24 NOTE — Anesthesia Postprocedure Evaluation (Signed)
Anesthesia Post Note  Patient: Lisa Hernandez  Procedure(s) Performed: ESOPHAGOGASTRODUODENOSCOPY (EGD) WITH PROPOFOL (N/A )  Patient location during evaluation: Endoscopy Anesthesia Type: General Level of consciousness: awake and alert Pain management: pain level controlled Vital Signs Assessment: post-procedure vital signs reviewed and stable Respiratory status: spontaneous breathing, nonlabored ventilation, respiratory function stable and patient connected to nasal cannula oxygen Cardiovascular status: blood pressure returned to baseline and stable Postop Assessment: no apparent nausea or vomiting Anesthetic complications: no     Last Vitals:  Vitals:   05/24/18 1543 05/24/18 1617  BP: 103/65 (!) 144/59  Pulse: 66   Resp:    Temp: (!) 36.1 C   SpO2: 96%     Last Pain:  Vitals:   05/24/18 1543  TempSrc: Tympanic  PainSc:                  Martha Clan

## 2018-05-24 NOTE — Anesthesia Preprocedure Evaluation (Signed)
Anesthesia Evaluation  Patient identified by MRN, date of birth, ID band Patient awake    Reviewed: Allergy & Precautions, NPO status , Patient's Chart, lab work & pertinent test results, reviewed documented beta blocker date and time   History of Anesthesia Complications Negative for: history of anesthetic complications  Airway Mallampati: II  TM Distance: >3 FB     Dental  (+) Upper Dentures, Lower Dentures   Pulmonary neg pulmonary ROS,           Cardiovascular Exercise Tolerance: Good hypertension, Pt. on medications      Neuro/Psych negative neurological ROS  negative psych ROS   GI/Hepatic Neg liver ROS, GERD  ,  Endo/Other  diabetes, Type 2  Renal/GU negative Renal ROS  negative genitourinary   Musculoskeletal  (+) Arthritis ,   Abdominal   Peds  Hematology negative hematology ROS (+)   Anesthesia Other Findings Past Medical History: No date: Diabetes mellitus without complication (HCC) No date: Dysphagia No date: Esophageal stricture     Comment:  a. s/p dilation x 7; b. 10/14/2017 EGD: benign appearing              esophageal stenosis (severe) w/ adherent scar tissue in               distal esophagus. No date: GERD (gastroesophageal reflux disease) No date: Hypertension No date: Osteoarthritis     Comment:  knees No date: Rheumatoid arthritis (HCC) No date: Thyroid nodule   Reproductive/Obstetrics negative OB ROS                             Anesthesia Physical  Anesthesia Plan  ASA: III  Anesthesia Plan: General   Post-op Pain Management:    Induction: Intravenous  PONV Risk Score and Plan: 2 and Propofol infusion  Airway Management Planned: Nasal Cannula  Additional Equipment:   Intra-op Plan:   Post-operative Plan:   Informed Consent: I have reviewed the patients History and Physical, chart, labs and discussed the procedure including the risks,  benefits and alternatives for the proposed anesthesia with the patient or authorized representative who has indicated his/her understanding and acceptance.     Plan Discussed with: CRNA  Anesthesia Plan Comments:         Anesthesia Quick Evaluation

## 2018-05-24 NOTE — H&P (Signed)
Primary Care Physician:  Leonel Ramsay, MD Primary Gastroenterologist:  Dr. [name]  Pre-Procedure History & Physical: HPI:  Lisa Hernandez is a 82 y.o. female is here for an endoscopy. Done for stricture in esophagus.   Past Medical History:  Diagnosis Date  . Diabetes mellitus without complication ([place])   . Dysphagia   . Esophageal stricture    a. s/p dilation x 7; b. 10/14/2017 EGD: benign appearing esophageal stenosis (severe) w/ adherent scar tissue in distal esophagus.  Marland Kitchen GERD (gastroesophageal reflux disease)   . Hypertension   . Osteoarthritis    knees  . Rheumatoid arthritis ([place])   . Thyroid nodule     Past Surgical History:  Procedure Laterality Date  . ABDOMINAL HYSTERECTOMY    . adenomatous colon polyps  08/25/2016  . CHOLECYSTECTOMY    . COLONOSCOPY    . ESOPHAGOGASTRODUODENOSCOPY (EGD) WITH PROPOFOL N/A 10/03/2015   Procedure: ESOPHAGOGASTRODUODENOSCOPY (EGD) WITH PROPOFOL;  Surgeon: Manya Silvas, MD;  Location: [facility] ENDOSCOPY;  Service: Endoscopy;  Laterality: N/A;  . ESOPHAGOGASTRODUODENOSCOPY (EGD) WITH PROPOFOL N/A 10/25/2015   Procedure: ESOPHAGOGASTRODUODENOSCOPY (EGD) WITH PROPOFOL;  Surgeon: Manya Silvas, MD;  Location: [facility] ENDOSCOPY;  Service: Endoscopy;  Laterality: N/A;   IDDM  . ESOPHAGOGASTRODUODENOSCOPY (EGD) WITH PROPOFOL N/A 10/27/2016   Procedure: ESOPHAGOGASTRODUODENOSCOPY (EGD) WITH PROPOFOL;  Surgeon: Manya Silvas, MD;  Location: [facility] ENDOSCOPY;  Service: Endoscopy;  Laterality: N/A;  Diabetic  . ESOPHAGOGASTRODUODENOSCOPY (EGD) WITH PROPOFOL N/A 10/14/2017   Procedure: ESOPHAGOGASTRODUODENOSCOPY (EGD) WITH PROPOFOL;  Surgeon: Manya Silvas, MD;  Location: [facility] ENDOSCOPY;  Service: Endoscopy;  Laterality: N/A;  . ESOPHAGOGASTRODUODENOSCOPY (EGD) WITH PROPOFOL N/A 11/13/2017   Procedure: ESOPHAGOGASTRODUODENOSCOPY (EGD) WITH PROPOFOL;  Surgeon: Manya Silvas, MD;  Location: [facility] ENDOSCOPY;  Service: Endoscopy;   Laterality: N/A;  . SAVORY DILATION N/A 10/03/2015   Procedure: SAVORY DILATION;  Surgeon: Manya Silvas, MD;  Location: [facility] ENDOSCOPY;  Service: Endoscopy;  Laterality: N/A;  . SAVORY DILATION N/A 10/25/2015   Procedure: SAVORY DILATION;  Surgeon: Manya Silvas, MD;  Location: [facility] ENDOSCOPY;  Service: Endoscopy;  Laterality: N/A;  . TONSILLECTOMY      Prior to Admission medications   Medication Sig Start Date End Date Taking? Authorizing Provider  atenolol (TENORMIN) 25 MG tablet Take 1 tablet 2 (two) times daily by mouth. 10/09/17 10/09/18 Yes [provider]  folic acid (FOLVITE) 1 MG tablet TAKE 1 TABLET BY MOUTH EVERY DAY 05/07/18  Yes Earlie Server, MD  furosemide (LASIX) 20 MG tablet TAKE ONE (1) TABLET EACH DAY 05/03/18  Yes [provider]  gabapentin (NEURONTIN) 100 MG capsule Take 100 mg by mouth 2 (two) times daily.   Yes [provider]  lisinopril (PRINIVIL,ZESTRIL) 10 MG tablet Take 10 mg 2 (two) times daily by mouth.    Yes [provider]  meclizine (ANTIVERT) 12.5 MG tablet Take 12.5 mg by mouth 3 (three) times daily as needed for dizziness.   Yes [provider]  metFORMIN (GLUCOPHAGE) 500 MG tablet Take 500 mg by mouth. Take 2 tabs in am, 1 tab in pm   Yes [provider]  pantoprazole (PROTONIX) 20 MG tablet Take 20 mg by mouth daily.   Yes [provider]  potassium chloride (K-DUR) 10 MEQ tablet TAKE 1 TABLET (10 MEQ TOTAL) BY MOUTH ONCE DAILY 05/03/18  Yes [provider]  traZODone (DESYREL) 50 MG tablet TAKE 1 TABLET (50 MG TOTAL) BY MOUTH NIGHTLY AS NEEDED FOR SLEEP 05/05/18  Yes [provider]  Jonetta Speak LANCETS 26S MISC 3 daily    [provider]    Allergies as of 05/20/2018 - Review Complete 05/13/2018  Allergen Reaction Noted  . Erythromycin ethylsuccinate [erythromycin] Other (See Comments) 10/02/2015    Family History  Problem Relation Age of Onset  .  Diabetes Mother   . Cancer Mother        died in her 3s  . Diabetes Father   . Heart attack Father        died @ 14  . Diabetes Sister     Social History   Socioeconomic History  . Marital status: Divorced    Spouse name: Not on file  . Number of children: Not on file  . Years of education: Not on file  . Highest education level: Not on file  Occupational History  . Occupation: retired  Scientific laboratory technician  . Financial resource strain: Not on file  . Food insecurity:    Worry: Not on file    Inability: Not on file  . Transportation needs:    Medical: Not on file    Non-medical: Not on file  Tobacco Use  . Smoking status: Never Smoker  . Smokeless tobacco: Never Used  Substance and Sexual Activity  . Alcohol use: No  . Drug use: No  . Sexual activity: Never  Lifestyle  . Physical activity:    Days per week: Not on file    Minutes per session: Not on file  . Stress: Not on file  Relationships  . Social connections:    Talks on phone: Not on file    Gets together: Not on file    Attends religious service: Not on file    Active member of club or organization: Not on file    Attends meetings of clubs or organizations: Not on file    Relationship status: Not on file  . Intimate partner violence:    Fear of current or ex partner: Not on file    Emotionally abused: Not on file    Physically abused: Not on file    Forced sexual activity: Not on file  Other Topics Concern  . Not on file  Social History Narrative   Lives in [place] by herself.  Sister nearby.  Active.    Review of Systems: See HPI, otherwise negative ROS  Physical Exam: BP (!) 155/65   Pulse (!) 59   Temp (!) 96.4 F (35.8 C) (Tympanic)   Resp 16   Ht 5\' 1"  (1.549 m)   Wt 53.1 kg (117 lb)   SpO2 100%   BMI 22.11 kg/m  General:   Alert,  pleasant and cooperative in NAD Head:  Normocephalic and atraumatic. Neck:  Supple; no masses or thyromegaly. Lungs:  Clear throughout to auscultation.     Heart:  Regular rate and rhythm. Abdomen:  Soft, nontender and nondistended. Normal bowel sounds, without guarding, and without rebound.   Neurologic:  Alert and  oriented x4;  grossly normal neurologically.  Impression/Plan: Lisa Hernandez is here for an endoscopy to be performed for dysphagia and esophageal stricture.  Risks, benefits, limitations, and alternatives regarding  endoscopy have been reviewed with the patient.  Questions have been answered.  All parties agreeable.   Gaylyn Cheers, MD  05/24/2018, 3:14 PM

## 2018-05-24 NOTE — Anesthesia Post-op Follow-up Note (Signed)
Anesthesia QCDR form completed.        

## 2018-05-24 NOTE — Transfer of Care (Signed)
Immediate Anesthesia Transfer of Care Note  Patient: Lisa Hernandez  Procedure(s) Performed: ESOPHAGOGASTRODUODENOSCOPY (EGD) WITH PROPOFOL (N/A )  Patient Location: Endoscopy Unit  Anesthesia Type:General  Level of Consciousness: drowsy and patient cooperative  Airway & Oxygen Therapy: Patient Spontanous Breathing and Patient connected to nasal cannula oxygen  Post-op Assessment: Report given to RN and Post -op Vital signs reviewed and stable  Post vital signs: Reviewed and stable  Last Vitals:  Vitals Value Taken Time  BP 103/65 05/24/2018  3:43 PM  Temp 36.1 C 05/24/2018  3:43 PM  Pulse 71 05/24/2018  3:44 PM  Resp 24 05/24/2018  3:44 PM  SpO2 100 % 05/24/2018  3:44 PM  Vitals shown include unvalidated device data.  Last Pain:  Vitals:   05/24/18 1543  TempSrc: Tympanic  PainSc:          Complications: No apparent anesthesia complications

## 2018-05-24 NOTE — Op Note (Signed)
[facility] Gastroenterology Patient Name: Lisa Hernandez Procedure Date: 05/24/2018 3:21 PM MRN: [id number] Account #: [id number] Date of Birth: 1936/06/18 Admit Type: Outpatient Age: 82 Room: [facility] ENDO ROOM 3 Gender: Female Note Status: Finalized Procedure:            Upper GI endoscopy Indications:          Dysphagia Providers:            Manya Silvas, MD Referring MD:         [name] (Referring MD) Medicines:            Propofol per Anesthesia Complications:        No immediate complications. Procedure:            Pre-Anesthesia Assessment:                       - After reviewing the risks and benefits, the patient                        was deemed in satisfactory condition to undergo the                        procedure.                       After obtaining informed consent, the endoscope was                        passed under direct vision. Throughout the procedure,                        the patient's blood pressure, pulse, and oxygen                        saturations were monitored continuously. The Endoscope                        was introduced through the mouth, and advanced to the                        esophagus down to the cardia. The upper GI endoscopy                        was accomplished without difficulty. The patient                        tolerated the procedure well. Findings:      Esophagitis with no bleeding was found. The GEJ was very tight and I       decided to pass a guide wire from in the esophagus under direct vision       and then removed the scope and then passed a 29F, a 40F and a 22F and a       54F savary dilators with good passage of the dilators. I decided to not       insert the scope due to likely hood of too much blood to see well. Impression:           - Esophagitis.                       - No specimens collected. Recommendation:       -  The findings and recommendations were discussed with       the patient's family. Repeat esophageal dilataqtion in                        3 months or so. Manya Silvas, MD 05/24/2018 3:40:13 PM This report has been signed electronically. Number of Addenda: 0 Note Initiated On: 05/24/2018 3:21 PM      [facility]

## 2018-05-25 ENCOUNTER — Encounter: Payer: Self-pay | Admitting: Unknown Physician Specialty

## 2018-07-19 DIAGNOSIS — M0579 Rheumatoid arthritis with rheumatoid factor of multiple sites without organ or systems involvement: Secondary | ICD-10-CM | POA: Diagnosis not present

## 2018-07-19 DIAGNOSIS — M150 Primary generalized (osteo)arthritis: Secondary | ICD-10-CM | POA: Diagnosis not present

## 2018-07-26 DIAGNOSIS — M150 Primary generalized (osteo)arthritis: Secondary | ICD-10-CM | POA: Diagnosis not present

## 2018-07-26 DIAGNOSIS — M0579 Rheumatoid arthritis with rheumatoid factor of multiple sites without organ or systems involvement: Secondary | ICD-10-CM | POA: Diagnosis not present

## 2018-07-26 DIAGNOSIS — G8929 Other chronic pain: Secondary | ICD-10-CM | POA: Diagnosis not present

## 2018-07-26 DIAGNOSIS — M25561 Pain in right knee: Secondary | ICD-10-CM | POA: Diagnosis not present

## 2018-07-26 DIAGNOSIS — M25562 Pain in left knee: Secondary | ICD-10-CM | POA: Diagnosis not present

## 2018-08-02 ENCOUNTER — Other Ambulatory Visit: Payer: Self-pay | Admitting: Oncology

## 2018-08-02 DIAGNOSIS — D709 Neutropenia, unspecified: Secondary | ICD-10-CM

## 2018-08-02 DIAGNOSIS — D696 Thrombocytopenia, unspecified: Secondary | ICD-10-CM

## 2018-08-12 ENCOUNTER — Inpatient Hospital Stay: Payer: Medicare HMO | Attending: Oncology

## 2018-08-12 DIAGNOSIS — D519 Vitamin B12 deficiency anemia, unspecified: Secondary | ICD-10-CM | POA: Insufficient documentation

## 2018-08-12 DIAGNOSIS — D529 Folate deficiency anemia, unspecified: Secondary | ICD-10-CM | POA: Diagnosis not present

## 2018-08-12 DIAGNOSIS — D61818 Other pancytopenia: Secondary | ICD-10-CM | POA: Diagnosis not present

## 2018-08-12 LAB — CBC WITH DIFFERENTIAL/PLATELET
BASOS ABS: 0 10*3/uL (ref 0–0.1)
Basophils Relative: 0 %
EOS PCT: 2 %
Eosinophils Absolute: 0.2 10*3/uL (ref 0–0.7)
HCT: 35.4 % (ref 35.0–47.0)
HEMOGLOBIN: 12.5 g/dL (ref 12.0–16.0)
LYMPHS ABS: 1.9 10*3/uL (ref 1.0–3.6)
LYMPHS PCT: 23 %
MCH: 31.5 pg (ref 26.0–34.0)
MCHC: 35.2 g/dL (ref 32.0–36.0)
MCV: 89.4 fL (ref 80.0–100.0)
Monocytes Absolute: 0.7 10*3/uL (ref 0.2–0.9)
Monocytes Relative: 9 %
NEUTROS ABS: 5.4 10*3/uL (ref 1.4–6.5)
NEUTROS PCT: 66 %
PLATELETS: 275 10*3/uL (ref 150–440)
RBC: 3.96 MIL/uL (ref 3.80–5.20)
RDW: 14.3 % (ref 11.5–14.5)
WBC: 8.2 10*3/uL (ref 3.6–11.0)

## 2018-08-12 LAB — VITAMIN B12: Vitamin B-12: 394 pg/mL (ref 180–914)

## 2018-08-19 ENCOUNTER — Encounter: Payer: Self-pay | Admitting: Oncology

## 2018-08-19 ENCOUNTER — Other Ambulatory Visit: Payer: Self-pay

## 2018-08-19 ENCOUNTER — Inpatient Hospital Stay: Payer: Medicare HMO | Attending: Oncology | Admitting: Oncology

## 2018-08-19 ENCOUNTER — Inpatient Hospital Stay: Payer: Medicare HMO

## 2018-08-19 VITALS — BP 132/65 | HR 57 | Temp 97.4°F | Resp 18 | Wt 120.6 lb

## 2018-08-19 DIAGNOSIS — M1990 Unspecified osteoarthritis, unspecified site: Secondary | ICD-10-CM

## 2018-08-19 DIAGNOSIS — R5382 Chronic fatigue, unspecified: Secondary | ICD-10-CM

## 2018-08-19 DIAGNOSIS — Z7984 Long term (current) use of oral hypoglycemic drugs: Secondary | ICD-10-CM | POA: Diagnosis not present

## 2018-08-19 DIAGNOSIS — D61818 Other pancytopenia: Secondary | ICD-10-CM

## 2018-08-19 DIAGNOSIS — E119 Type 2 diabetes mellitus without complications: Secondary | ICD-10-CM

## 2018-08-19 DIAGNOSIS — I10 Essential (primary) hypertension: Secondary | ICD-10-CM

## 2018-08-19 DIAGNOSIS — D519 Vitamin B12 deficiency anemia, unspecified: Secondary | ICD-10-CM | POA: Diagnosis not present

## 2018-08-19 DIAGNOSIS — R634 Abnormal weight loss: Secondary | ICD-10-CM

## 2018-08-19 DIAGNOSIS — E538 Deficiency of other specified B group vitamins: Secondary | ICD-10-CM

## 2018-08-19 DIAGNOSIS — R0602 Shortness of breath: Secondary | ICD-10-CM

## 2018-08-19 DIAGNOSIS — Z79899 Other long term (current) drug therapy: Secondary | ICD-10-CM

## 2018-08-19 DIAGNOSIS — K219 Gastro-esophageal reflux disease without esophagitis: Secondary | ICD-10-CM | POA: Diagnosis not present

## 2018-08-19 DIAGNOSIS — R5381 Other malaise: Secondary | ICD-10-CM

## 2018-08-19 DIAGNOSIS — R079 Chest pain, unspecified: Secondary | ICD-10-CM

## 2018-08-19 DIAGNOSIS — M069 Rheumatoid arthritis, unspecified: Secondary | ICD-10-CM | POA: Diagnosis not present

## 2018-08-19 DIAGNOSIS — R531 Weakness: Secondary | ICD-10-CM | POA: Diagnosis not present

## 2018-08-19 DIAGNOSIS — K222 Esophageal obstruction: Secondary | ICD-10-CM

## 2018-08-19 MED ORDER — CYANOCOBALAMIN 1000 MCG/ML IJ SOLN
1000.0000 ug | Freq: Once | INTRAMUSCULAR | Status: AC
  Administered 2018-08-19: 1000 ug via INTRAMUSCULAR
  Filled 2018-08-19: qty 1

## 2018-08-19 NOTE — Progress Notes (Signed)
Patient here for follow up. No changes from since last visit.

## 2018-08-20 NOTE — Progress Notes (Signed)
Hematology/Oncology Follow Up Note [facility]  Telephone:([phone] [phone]  Patient Care Team: Leonel Ramsay, MD as PCP - General (Infectious Diseases)   Name of the patient: Lisa Hernandez  [id number]  July 12, 1936   REASON FOR VISIT Follow up for pancytopenia.   HISTORY OF PRESENT ILLNESS This is a 82 year old female with past medical history listed as below including rheumatoid arthritis on chronic methotrexate initially presented to emergency room for evaluation of chest pain, SOB, generalized weakness.She was found to have pancytopenia. Chest pain and EKG changes, likely demand mismatch She takes low dose MTX for rheumatoid arthritis for many years. Denies any new medication or herbal supplementation.  Work up revealed folate deficiency and B12 deficiency and she was started on oral folate supplementation and IM B12 injections.  Patient denies any fever or chills. She has a history of esophageal stricture and she is status post a data dictation last week. She denies any new medications except atenolol was added to her management is recently. Denies any herbal medication or supplementation. She denies drinking any alcohol. She has some weight loss because of not being able to eat well due to the esophageal stricture. She had a bone marrow biopsy during admission.   INTERVAL HISTORY Today she follows up for evaluation and management of vitamin B12 deficiency. Reports feeling well at baseline.  Mild chronic fatigue unchanged.  Appetite is fair. She has chronic intermittent remittent dysphagia due to recurrent esophageal stricture.  This has not changed.  Denies any bleeding, melena or easy bruising. During the interval she has got another upper endoscopy on 05/24/2018 which showed esophagitis and a GE junction stricture, esophageal dilatation was performed.  Review of Systems  Constitutional: Positive for malaise/fatigue. Negative for chills,  fever and weight loss.  HENT: Negative for congestion, ear discharge, ear pain, hearing loss, nosebleeds, sinus pain and sore throat.   Eyes: Negative for blurred vision, double vision, photophobia, pain, discharge and redness.  Respiratory: Negative for cough, hemoptysis, sputum production, shortness of breath and wheezing.   Cardiovascular: Negative for chest pain, palpitations, orthopnea, claudication and leg swelling.  Gastrointestinal: Negative for abdominal pain, blood in stool, constipation, diarrhea, heartburn, melena, nausea and vomiting.  Genitourinary: Negative for dysuria, flank pain, frequency and hematuria.  Musculoskeletal: Positive for joint pain. Negative for back pain, myalgias and neck pain.  Skin: Negative for itching and rash.  Neurological: Negative for dizziness, tingling, tremors, focal weakness, weakness and headaches.  Endo/Heme/Allergies: Negative for environmental allergies. Does not bruise/bleed easily.  Psychiatric/Behavioral: Negative for depression, hallucinations and substance abuse. The patient is not nervous/anxious.      Allergies  Allergen Reactions  . Erythromycin Ethylsuccinate [Erythromycin] Other (See Comments)    GI upset     Past Medical History:  Diagnosis Date  . Diabetes mellitus without complication ([place])   . Dysphagia   . Esophageal stricture    a. s/p dilation x 7; b. 10/14/2017 EGD: benign appearing esophageal stenosis (severe) w/ adherent scar tissue in distal esophagus.  Marland Kitchen GERD (gastroesophageal reflux disease)   . Hypertension   . Osteoarthritis    knees  . Rheumatoid arthritis ([place])   . Thyroid nodule      Past Surgical History:  Procedure Laterality Date  . ABDOMINAL HYSTERECTOMY    . adenomatous colon polyps  08/25/2016  . CHOLECYSTECTOMY    . COLONOSCOPY    . ESOPHAGOGASTRODUODENOSCOPY (EGD) WITH PROPOFOL N/A 10/03/2015   Procedure: ESOPHAGOGASTRODUODENOSCOPY (EGD) WITH PROPOFOL;  Surgeon: Herbie Baltimore  Federico Flake, MD;   Location: [facility] ENDOSCOPY;  Service: Endoscopy;  Laterality: N/A;  . ESOPHAGOGASTRODUODENOSCOPY (EGD) WITH PROPOFOL N/A 10/25/2015   Procedure: ESOPHAGOGASTRODUODENOSCOPY (EGD) WITH PROPOFOL;  Surgeon: Manya Silvas, MD;  Location: [facility] ENDOSCOPY;  Service: Endoscopy;  Laterality: N/A;   IDDM  . ESOPHAGOGASTRODUODENOSCOPY (EGD) WITH PROPOFOL N/A 10/27/2016   Procedure: ESOPHAGOGASTRODUODENOSCOPY (EGD) WITH PROPOFOL;  Surgeon: Manya Silvas, MD;  Location: [facility] ENDOSCOPY;  Service: Endoscopy;  Laterality: N/A;  Diabetic  . ESOPHAGOGASTRODUODENOSCOPY (EGD) WITH PROPOFOL N/A 10/14/2017   Procedure: ESOPHAGOGASTRODUODENOSCOPY (EGD) WITH PROPOFOL;  Surgeon: Manya Silvas, MD;  Location: [facility] ENDOSCOPY;  Service: Endoscopy;  Laterality: N/A;  . ESOPHAGOGASTRODUODENOSCOPY (EGD) WITH PROPOFOL N/A 11/13/2017   Procedure: ESOPHAGOGASTRODUODENOSCOPY (EGD) WITH PROPOFOL;  Surgeon: Manya Silvas, MD;  Location: [facility] ENDOSCOPY;  Service: Endoscopy;  Laterality: N/A;  . ESOPHAGOGASTRODUODENOSCOPY (EGD) WITH PROPOFOL N/A 05/24/2018   Procedure: ESOPHAGOGASTRODUODENOSCOPY (EGD) WITH PROPOFOL;  Surgeon: Manya Silvas, MD;  Location: [facility] ENDOSCOPY;  Service: Endoscopy;  Laterality: N/A;  . SAVORY DILATION N/A 10/03/2015   Procedure: SAVORY DILATION;  Surgeon: Manya Silvas, MD;  Location: [facility] ENDOSCOPY;  Service: Endoscopy;  Laterality: N/A;  . SAVORY DILATION N/A 10/25/2015   Procedure: SAVORY DILATION;  Surgeon: Manya Silvas, MD;  Location: [facility] ENDOSCOPY;  Service: Endoscopy;  Laterality: N/A;  . TONSILLECTOMY      Social History   Socioeconomic History  . Marital status: Divorced    Spouse name: Not on file  . Number of children: Not on file  . Years of education: Not on file  . Highest education level: Not on file  Occupational History  . Occupation: retired  Scientific laboratory technician  . Financial resource strain: Not on file  . Food insecurity:    Worry: Not on file    Inability: Not  on file  . Transportation needs:    Medical: Not on file    Non-medical: Not on file  Tobacco Use  . Smoking status: Never Smoker  . Smokeless tobacco: Never Used  Substance and Sexual Activity  . Alcohol use: No  . Drug use: No  . Sexual activity: Never  Lifestyle  . Physical activity:    Days per week: Not on file    Minutes per session: Not on file  . Stress: Not on file  Relationships  . Social connections:    Talks on phone: Not on file    Gets together: Not on file    Attends religious service: Not on file    Active member of club or organization: Not on file    Attends meetings of clubs or organizations: Not on file    Relationship status: Not on file  . Intimate partner violence:    Fear of current or ex partner: Not on file    Emotionally abused: Not on file    Physically abused: Not on file    Forced sexual activity: Not on file  Other Topics Concern  . Not on file  Social History Narrative   Lives in [place] by herself.  Sister nearby.  Active.    Family History  Problem Relation Age of Onset  . Diabetes Mother   . Cancer Mother        died in her 4s  . Diabetes Father   . Heart attack Father        died @ 65  . Diabetes Sister      Current Outpatient Medications:  .  atenolol (TENORMIN) 25 MG  tablet, Take 1 tablet 2 (two) times daily by mouth., Disp: , Rfl:  .  folic acid (FOLVITE) 1 MG tablet, TAKE 1 TABLET BY MOUTH EVERY DAY, Disp: 90 tablet, Rfl: 0 .  furosemide (LASIX) 20 MG tablet, TAKE ONE (1) TABLET EACH DAY, Disp: , Rfl:  .  gabapentin (NEURONTIN) 100 MG capsule, Take 100 mg by mouth 2 (two) times daily., Disp: , Rfl:  .  hydroxychloroquine (PLAQUENIL) 200 MG tablet, Take 200 mg by mouth daily., Disp: , Rfl: 1 .  lisinopril (PRINIVIL,ZESTRIL) 10 MG tablet, Take 10 mg 2 (two) times daily by mouth. , Disp: , Rfl:  .  meclizine (ANTIVERT) 12.5 MG tablet, Take 12.5 mg by mouth 3 (three) times daily as needed for dizziness., Disp: , Rfl:  .   metFORMIN (GLUCOPHAGE) 500 MG tablet, Take 500 mg by mouth. Take 2 tabs in am, 1 tab in pm, Disp: , Rfl:  .  ONETOUCH DELICA LANCETS 73A MISC, 3 daily, Disp: , Rfl:  .  pantoprazole (PROTONIX) 20 MG tablet, Take 20 mg by mouth daily., Disp: , Rfl:  .  potassium chloride (K-DUR) 10 MEQ tablet, TAKE 1 TABLET (10 MEQ TOTAL) BY MOUTH ONCE DAILY, Disp: , Rfl: 11 .  traZODone (DESYREL) 50 MG tablet, TAKE 1 TABLET (50 MG TOTAL) BY MOUTH NIGHTLY AS NEEDED FOR SLEEP, Disp: , Rfl: 3  Physical exam:  Vitals:   08/19/18 1341  BP: 132/65  Pulse: (!) 57  Resp: 18  Temp: (!) 97.4 F (36.3 C)  TempSrc: Tympanic  Weight: 120 lb 9.6 oz (54.7 kg)   Physical Exam  Constitutional: She is oriented to person, place, and time. No distress.  Frail elderly lady  HENT:  Head: Normocephalic and atraumatic.  Nose: Nose normal.  Mouth/Throat: Oropharynx is clear and moist. No oropharyngeal exudate.  Eyes: Pupils are equal, round, and reactive to light. EOM are normal. Left eye exhibits no discharge. No scleral icterus.  Neck: Normal range of motion. Neck supple. No JVD present.  Cardiovascular: Normal rate, regular rhythm and normal heart sounds.  No murmur heard. Pulmonary/Chest: Effort normal and breath sounds normal. No respiratory distress. She has no wheezes. She has no rales. She exhibits no tenderness.  Abdominal: Soft. She exhibits no distension and no mass. There is no tenderness. There is no rebound.  Musculoskeletal: Normal range of motion. She exhibits no edema or tenderness.  Lymphadenopathy:    She has no cervical adenopathy.  Neurological: She is alert and oriented to person, place, and time. No cranial nerve deficit. She exhibits normal muscle tone. Coordination normal.  Skin: Skin is warm and dry. No rash noted. She is not diaphoretic. No erythema.  Psychiatric: Affect and judgment normal.  .   CMP Latest Ref Rng & Units 05/07/2018  Glucose 65 - 99 mg/dL 139(H)  BUN 6 - 20 mg/dL 19    Creatinine 0.44 - 1.00 mg/dL 1.31(H)  Sodium 135 - 145 mmol/L 141  Potassium 3.5 - 5.1 mmol/L 3.4(L)  Chloride 101 - 111 mmol/L 106  CO2 22 - 32 mmol/L 25  Calcium 8.9 - 10.3 mg/dL 8.9  Total Protein 6.5 - 8.1 g/dL 6.7  Total Bilirubin 0.3 - 1.2 mg/dL 1.1  Alkaline Phos 38 - 126 U/L 37(L)  AST 15 - 41 U/L 21  ALT 14 - 54 U/L 9(L)   CBC Latest Ref Rng & Units 08/12/2018  WBC 3.6 - 11.0 K/uL 8.2  Hemoglobin 12.0 - 16.0 g/dL 12.5  Hematocrit 35.0 -  47.0 % 35.4  Platelets 150 - 440 K/uL 275     Bone marrow biopsy pathology 10/26/2017 Diagnosis Bone Marrow, Aspirate,Biopsy, and Clot, left iliac - HYPERCELLULAR BONE MARROW WITH DYSPOIETIC CHANGES. - SEE COMMENT. PERIPHERAL BLOOD: - PANCYTOPENIA. Diagnosis Note The bone marrow is hypercellular with trilineage hematopoiesis but with relative abundance of erythroid precursors. Myeloid cell lines display dyspoietic changes including megaloblastoid changes but with no increase in blastic cells. I suspect that the changes are likely related to the patients known low vitamin B12 and folate levels in addition to methotrexate treatment. In this setting, it is extremely difficult to assess for the presence or absence of myelodysplastic syndrome. Re-evaluation after vitamin B12 and folate treatment is recommended. Cytogenetic studies may also be helpful in this regard. (BNS:gt, 10/27/17)   Assessment and plan Patient is a 82 y.o. female who is on low-dose methotrexate chronically presented with pancytopenia, chest pain and shortness of breath. 1. B12 deficiency   2. Anemia due to vitamin B12 deficiency, unspecified B12 deficiency type    #Patient's anemia has resolved.  Hemoglobin normalized. Continue to monitor. #Vitamin B12 deficiency, with positive intrinsic factor.  Recommend vitamin B12 1000 MCG monthly.  #History of folic deficiency: Resolved.  Continue folic acid supplements.Marland Kitchen   #Chronic esophagitis/stricture, follow-up with  gastroenterology.  Last endoscopy was in June 2019.  Status post dilatation.  Follow up in 3 months repeat cbc, cmp, b12,folate Total face to face encounter time for this patient visit was 15 min. >50% of the time was  spent in counseling and coordination of care.   Earlie Server, MD, PhD Hematology Oncology [facility] Pager- [phone]

## 2018-09-16 ENCOUNTER — Other Ambulatory Visit: Payer: Self-pay | Admitting: Oncology

## 2018-09-16 ENCOUNTER — Inpatient Hospital Stay: Payer: Medicare HMO | Attending: Oncology

## 2018-09-16 DIAGNOSIS — D519 Vitamin B12 deficiency anemia, unspecified: Secondary | ICD-10-CM | POA: Insufficient documentation

## 2018-09-16 DIAGNOSIS — D61818 Other pancytopenia: Secondary | ICD-10-CM | POA: Diagnosis not present

## 2018-09-16 DIAGNOSIS — E538 Deficiency of other specified B group vitamins: Secondary | ICD-10-CM

## 2018-09-16 MED ORDER — CYANOCOBALAMIN 1000 MCG/ML IJ SOLN
1000.0000 ug | Freq: Once | INTRAMUSCULAR | Status: AC
  Administered 2018-09-16: 1000 ug via INTRAMUSCULAR

## 2018-10-07 ENCOUNTER — Other Ambulatory Visit: Payer: Self-pay | Admitting: Oncology

## 2018-10-07 DIAGNOSIS — D709 Neutropenia, unspecified: Secondary | ICD-10-CM

## 2018-10-07 DIAGNOSIS — D696 Thrombocytopenia, unspecified: Secondary | ICD-10-CM

## 2018-10-21 ENCOUNTER — Inpatient Hospital Stay: Payer: Medicare HMO | Attending: Oncology

## 2018-10-21 DIAGNOSIS — D529 Folate deficiency anemia, unspecified: Secondary | ICD-10-CM | POA: Diagnosis not present

## 2018-10-21 DIAGNOSIS — D61818 Other pancytopenia: Secondary | ICD-10-CM | POA: Insufficient documentation

## 2018-10-21 DIAGNOSIS — D519 Vitamin B12 deficiency anemia, unspecified: Secondary | ICD-10-CM | POA: Insufficient documentation

## 2018-10-21 DIAGNOSIS — E538 Deficiency of other specified B group vitamins: Secondary | ICD-10-CM

## 2018-10-21 MED ORDER — CYANOCOBALAMIN 1000 MCG/ML IJ SOLN
1000.0000 ug | Freq: Once | INTRAMUSCULAR | Status: AC
  Administered 2018-10-21: 1000 ug via INTRAMUSCULAR

## 2018-11-16 ENCOUNTER — Inpatient Hospital Stay: Payer: Medicare HMO | Attending: Oncology

## 2018-11-16 DIAGNOSIS — Z79899 Other long term (current) drug therapy: Secondary | ICD-10-CM | POA: Diagnosis not present

## 2018-11-16 DIAGNOSIS — R5382 Chronic fatigue, unspecified: Secondary | ICD-10-CM | POA: Diagnosis not present

## 2018-11-16 DIAGNOSIS — E538 Deficiency of other specified B group vitamins: Secondary | ICD-10-CM | POA: Diagnosis not present

## 2018-11-16 DIAGNOSIS — D519 Vitamin B12 deficiency anemia, unspecified: Secondary | ICD-10-CM

## 2018-11-16 DIAGNOSIS — D61818 Other pancytopenia: Secondary | ICD-10-CM | POA: Insufficient documentation

## 2018-11-16 DIAGNOSIS — K219 Gastro-esophageal reflux disease without esophagitis: Secondary | ICD-10-CM | POA: Insufficient documentation

## 2018-11-16 DIAGNOSIS — R5381 Other malaise: Secondary | ICD-10-CM | POA: Diagnosis not present

## 2018-11-16 DIAGNOSIS — R634 Abnormal weight loss: Secondary | ICD-10-CM | POA: Diagnosis not present

## 2018-11-16 DIAGNOSIS — E119 Type 2 diabetes mellitus without complications: Secondary | ICD-10-CM | POA: Insufficient documentation

## 2018-11-16 DIAGNOSIS — I10 Essential (primary) hypertension: Secondary | ICD-10-CM | POA: Insufficient documentation

## 2018-11-16 DIAGNOSIS — M069 Rheumatoid arthritis, unspecified: Secondary | ICD-10-CM | POA: Insufficient documentation

## 2018-11-16 LAB — CBC WITH DIFFERENTIAL/PLATELET
Abs Immature Granulocytes: 0.05 10*3/uL (ref 0.00–0.07)
BASOS ABS: 0 10*3/uL (ref 0.0–0.1)
Basophils Relative: 0 %
EOS PCT: 3 %
Eosinophils Absolute: 0.2 10*3/uL (ref 0.0–0.5)
HCT: 34.7 % — ABNORMAL LOW (ref 36.0–46.0)
HEMOGLOBIN: 11.6 g/dL — AB (ref 12.0–15.0)
Immature Granulocytes: 1 %
LYMPHS PCT: 27 %
Lymphs Abs: 1.8 10*3/uL (ref 0.7–4.0)
MCH: 30.4 pg (ref 26.0–34.0)
MCHC: 33.4 g/dL (ref 30.0–36.0)
MCV: 90.8 fL (ref 80.0–100.0)
Monocytes Absolute: 0.5 10*3/uL (ref 0.1–1.0)
Monocytes Relative: 8 %
NEUTROS ABS: 4 10*3/uL (ref 1.7–7.7)
NRBC: 0 % (ref 0.0–0.2)
Neutrophils Relative %: 61 %
Platelets: 256 10*3/uL (ref 150–400)
RBC: 3.82 MIL/uL — AB (ref 3.87–5.11)
RDW: 13.9 % (ref 11.5–15.5)
WBC: 6.6 10*3/uL (ref 4.0–10.5)

## 2018-11-16 LAB — VITAMIN B12: Vitamin B-12: 717 pg/mL (ref 180–914)

## 2018-11-16 LAB — FOLATE: FOLATE: 44 ng/mL (ref >5.9)

## 2018-11-18 ENCOUNTER — Other Ambulatory Visit: Payer: Self-pay

## 2018-11-18 ENCOUNTER — Inpatient Hospital Stay: Payer: Medicare HMO

## 2018-11-18 ENCOUNTER — Encounter: Payer: Self-pay | Admitting: Oncology

## 2018-11-18 ENCOUNTER — Inpatient Hospital Stay (HOSPITAL_BASED_OUTPATIENT_CLINIC_OR_DEPARTMENT_OTHER): Payer: Medicare HMO | Admitting: Oncology

## 2018-11-18 VITALS — BP 176/76 | HR 60 | Temp 98.4°F | Resp 18 | Wt 125.2 lb

## 2018-11-18 DIAGNOSIS — K219 Gastro-esophageal reflux disease without esophagitis: Secondary | ICD-10-CM

## 2018-11-18 DIAGNOSIS — R634 Abnormal weight loss: Secondary | ICD-10-CM

## 2018-11-18 DIAGNOSIS — M069 Rheumatoid arthritis, unspecified: Secondary | ICD-10-CM | POA: Diagnosis not present

## 2018-11-18 DIAGNOSIS — R5382 Chronic fatigue, unspecified: Secondary | ICD-10-CM

## 2018-11-18 DIAGNOSIS — I10 Essential (primary) hypertension: Secondary | ICD-10-CM | POA: Diagnosis not present

## 2018-11-18 DIAGNOSIS — E538 Deficiency of other specified B group vitamins: Secondary | ICD-10-CM

## 2018-11-18 DIAGNOSIS — D61818 Other pancytopenia: Secondary | ICD-10-CM | POA: Diagnosis not present

## 2018-11-18 DIAGNOSIS — E119 Type 2 diabetes mellitus without complications: Secondary | ICD-10-CM | POA: Diagnosis not present

## 2018-11-18 DIAGNOSIS — R5381 Other malaise: Secondary | ICD-10-CM

## 2018-11-18 DIAGNOSIS — D519 Vitamin B12 deficiency anemia, unspecified: Secondary | ICD-10-CM

## 2018-11-18 DIAGNOSIS — Z79899 Other long term (current) drug therapy: Secondary | ICD-10-CM

## 2018-11-18 MED ORDER — CYANOCOBALAMIN 1000 MCG/ML IJ SOLN
1000.0000 ug | Freq: Once | INTRAMUSCULAR | Status: AC
  Administered 2018-11-18: 1000 ug via INTRAMUSCULAR
  Filled 2018-11-18: qty 1

## 2018-11-18 NOTE — Progress Notes (Signed)
Hematology/Oncology Follow Up Note [facility]  Telephone:([phone] [phone]  Patient Care Team: Leonel Ramsay, MD as PCP - General (Infectious Diseases)   Name of the patient: Jerlene Rockers  [id number]  08-05-1936   REASON FOR VISIT Follow up for pancytopenia.   HISTORY OF PRESENT ILLNESS This is a 82 year old female with past medical history listed as below including rheumatoid arthritis on chronic methotrexate initially presented to emergency room for evaluation of chest pain, SOB, generalized weakness.She was found to have pancytopenia. Chest pain and EKG changes, likely demand mismatch She takes low dose MTX for rheumatoid arthritis for many years. Denies any new medication or herbal supplementation.  Work up revealed folate deficiency and B12 deficiency and she was started on oral folate supplementation and IM B12 injections.  Patient denies any fever or chills. She has a history of esophageal stricture and she is status post a data dictation last week. She denies any new medications except atenolol was added to her management is recently. Denies any herbal medication or supplementation. She denies drinking any alcohol. She has some weight loss because of not being able to eat well due to the esophageal stricture.  Bone marrow biopsy 10/26/2017 showed  Bone Marrow, Aspirate,Biopsy, and Clot, left iliac - HYPERCELLULAR BONE MARROW WITH DYSPOIETIC CHANGES. - SEE COMMENT. PERIPHERAL BLOOD: - PANCYTOPENIA. Diagnosis Note The bone marrow is hypercellular with trilineage hematopoiesis but with relative abundance of erythroid precursors. Myeloid cell lines display dyspoietic changes including megaloblastoid changes but with no increase in blastic cells. I suspect that the changes are likely related to the patients known low vitamin B12 and folate levels in addition to methotrexate treatment. In this setting, it is extremely difficult to assess  for the presence or absence of myelodysplastic syndrome. Re-evaluation after vitamin B12 and folate treatment is recommended. Cytogenetic studies may also be helpful in this regard. (BNS:gt, 10/27/17)  She has chronic intermittent remittent dysphagia due to recurrent esophageal stricture.  This has not changed.  Denies any bleeding, melena or easy bruising. During the interval she has got another upper endoscopy on 05/24/2018 which showed esophagitis and a GE junction stricture, esophageal dilatation was performed.  INTERVAL HISTORY Today she follows up for evaluation and management of vitamin B12 deficiency. Reports feeling well at baseline. Mild chronic fatigue.  Good appetite. No new compliant.  Review of Systems  Constitutional: Positive for malaise/fatigue. Negative for chills, fever and weight loss.  HENT: Negative for congestion, ear discharge, ear pain, hearing loss, nosebleeds, sinus pain and sore throat.   Eyes: Negative for blurred vision, double vision, photophobia, pain, discharge and redness.  Respiratory: Negative for cough, hemoptysis, sputum production, shortness of breath and wheezing.   Cardiovascular: Negative for chest pain, palpitations, orthopnea, claudication and leg swelling.  Gastrointestinal: Negative for abdominal pain, blood in stool, constipation, diarrhea, heartburn, melena, nausea and vomiting.  Genitourinary: Negative for dysuria, flank pain, frequency and hematuria.  Musculoskeletal: Positive for joint pain. Negative for back pain, myalgias and neck pain.  Skin: Negative for itching and rash.  Neurological: Negative for dizziness, tingling, tremors, focal weakness, weakness and headaches.  Endo/Heme/Allergies: Negative for environmental allergies. Does not bruise/bleed easily.  Psychiatric/Behavioral: Negative for depression, hallucinations and substance abuse. The patient is not nervous/anxious.      Allergies  Allergen Reactions  . Erythromycin  Ethylsuccinate [Erythromycin] Other (See Comments)    GI upset     Past Medical History:  Diagnosis Date  . Diabetes mellitus without complication (  [place])   . Dysphagia   . Esophageal stricture    a. s/p dilation x 7; b. 10/14/2017 EGD: benign appearing esophageal stenosis (severe) w/ adherent scar tissue in distal esophagus.  Marland Kitchen GERD (gastroesophageal reflux disease)   . Hypertension   . Osteoarthritis    knees  . Rheumatoid arthritis ([place])   . Thyroid nodule      Past Surgical History:  Procedure Laterality Date  . ABDOMINAL HYSTERECTOMY    . adenomatous colon polyps  08/25/2016  . CHOLECYSTECTOMY    . COLONOSCOPY    . ESOPHAGOGASTRODUODENOSCOPY (EGD) WITH PROPOFOL N/A 10/03/2015   Procedure: ESOPHAGOGASTRODUODENOSCOPY (EGD) WITH PROPOFOL;  Surgeon: Manya Silvas, MD;  Location: [facility] ENDOSCOPY;  Service: Endoscopy;  Laterality: N/A;  . ESOPHAGOGASTRODUODENOSCOPY (EGD) WITH PROPOFOL N/A 10/25/2015   Procedure: ESOPHAGOGASTRODUODENOSCOPY (EGD) WITH PROPOFOL;  Surgeon: Manya Silvas, MD;  Location: [facility] ENDOSCOPY;  Service: Endoscopy;  Laterality: N/A;   IDDM  . ESOPHAGOGASTRODUODENOSCOPY (EGD) WITH PROPOFOL N/A 10/27/2016   Procedure: ESOPHAGOGASTRODUODENOSCOPY (EGD) WITH PROPOFOL;  Surgeon: Manya Silvas, MD;  Location: [facility] ENDOSCOPY;  Service: Endoscopy;  Laterality: N/A;  Diabetic  . ESOPHAGOGASTRODUODENOSCOPY (EGD) WITH PROPOFOL N/A 10/14/2017   Procedure: ESOPHAGOGASTRODUODENOSCOPY (EGD) WITH PROPOFOL;  Surgeon: Manya Silvas, MD;  Location: [facility] ENDOSCOPY;  Service: Endoscopy;  Laterality: N/A;  . ESOPHAGOGASTRODUODENOSCOPY (EGD) WITH PROPOFOL N/A 11/13/2017   Procedure: ESOPHAGOGASTRODUODENOSCOPY (EGD) WITH PROPOFOL;  Surgeon: Manya Silvas, MD;  Location: [facility] ENDOSCOPY;  Service: Endoscopy;  Laterality: N/A;  . ESOPHAGOGASTRODUODENOSCOPY (EGD) WITH PROPOFOL N/A 05/24/2018   Procedure: ESOPHAGOGASTRODUODENOSCOPY (EGD) WITH PROPOFOL;  Surgeon: Manya Silvas, MD;  Location: [facility] ENDOSCOPY;  Service: Endoscopy;  Laterality: N/A;  . SAVORY DILATION N/A 10/03/2015   Procedure: SAVORY DILATION;  Surgeon: Manya Silvas, MD;  Location: [facility] ENDOSCOPY;  Service: Endoscopy;  Laterality: N/A;  . SAVORY DILATION N/A 10/25/2015   Procedure: SAVORY DILATION;  Surgeon: Manya Silvas, MD;  Location: [facility] ENDOSCOPY;  Service: Endoscopy;  Laterality: N/A;  . TONSILLECTOMY      Social History   Socioeconomic History  . Marital status: Divorced    Spouse name: Not on file  . Number of children: Not on file  . Years of education: Not on file  . Highest education level: Not on file  Occupational History  . Occupation: retired  Scientific laboratory technician  . Financial resource strain: Not on file  . Food insecurity:    Worry: Not on file    Inability: Not on file  . Transportation needs:    Medical: Not on file    Non-medical: Not on file  Tobacco Use  . Smoking status: Never Smoker  . Smokeless tobacco: Never Used  Substance and Sexual Activity  . Alcohol use: No  . Drug use: No  . Sexual activity: Never  Lifestyle  . Physical activity:    Days per week: Not on file    Minutes per session: Not on file  . Stress: Not on file  Relationships  . Social connections:    Talks on phone: Not on file    Gets together: Not on file    Attends religious service: Not on file    Active member of club or organization: Not on file    Attends meetings of clubs or organizations: Not on file    Relationship status: Not on file  . Intimate partner violence:    Fear of current or ex partner: Not on file    Emotionally abused: Not on  file    Physically abused: Not on file    Forced sexual activity: Not on file  Other Topics Concern  . Not on file  Social History Narrative   Lives in [place] by herself.  Sister nearby.  Active.    Family History  Problem Relation Age of Onset  . Diabetes Mother   . Cancer Mother        died in her 11s  . Diabetes Father   .  Heart attack Father        died @ 71  . Diabetes Sister      Current Outpatient Medications:  .  folic acid (FOLVITE) 1 MG tablet, TAKE 1 TABLET BY MOUTH EVERY DAY, Disp: 30 tablet, Rfl: 2 .  furosemide (LASIX) 20 MG tablet, TAKE ONE (1) TABLET EACH DAY, Disp: , Rfl:  .  gabapentin (NEURONTIN) 100 MG capsule, Take 100 mg by mouth 2 (two) times daily., Disp: , Rfl:  .  hydroxychloroquine (PLAQUENIL) 200 MG tablet, Take 200 mg by mouth daily., Disp: , Rfl: 1 .  lisinopril (PRINIVIL,ZESTRIL) 10 MG tablet, Take 10 mg 2 (two) times daily by mouth. , Disp: , Rfl:  .  meclizine (ANTIVERT) 12.5 MG tablet, Take 12.5 mg by mouth 3 (three) times daily as needed for dizziness., Disp: , Rfl:  .  metFORMIN (GLUCOPHAGE) 500 MG tablet, Take 500 mg by mouth. Take 2 tabs in am, 1 tab in pm, Disp: , Rfl:  .  ONETOUCH DELICA LANCETS 27O MISC, 3 daily, Disp: , Rfl:  .  pantoprazole (PROTONIX) 20 MG tablet, Take 20 mg by mouth daily., Disp: , Rfl:  .  potassium chloride (K-DUR) 10 MEQ tablet, TAKE 1 TABLET (10 MEQ TOTAL) BY MOUTH ONCE DAILY, Disp: , Rfl: 11 .  traZODone (DESYREL) 50 MG tablet, TAKE 1 TABLET (50 MG TOTAL) BY MOUTH NIGHTLY AS NEEDED FOR SLEEP, Disp: , Rfl: 3 .  atenolol (TENORMIN) 25 MG tablet, Take 1 tablet 2 (two) times daily by mouth., Disp: , Rfl:   Physical exam:  Vitals:   11/18/18 1333 11/18/18 1336  BP: (!) 184/76 (!) 176/76  Pulse: (!) 56 60  Resp: 18 18  Temp: 98.4 F (36.9 C) 98.4 F (36.9 C)  TempSrc: Tympanic Tympanic  Weight: 125 lb 3.2 oz (56.8 kg)    Physical Exam  Constitutional: She is oriented to person, place, and time. No distress.  Frail elderly lady  HENT:  Head: Normocephalic and atraumatic.  Nose: Nose normal.  Mouth/Throat: Oropharynx is clear and moist. No oropharyngeal exudate.  Eyes: Pupils are equal, round, and reactive to light. EOM are normal. Left eye exhibits no discharge. No scleral icterus.  Neck: Normal range of motion. Neck supple. No JVD  present.  Cardiovascular: Normal rate, regular rhythm and normal heart sounds.  No murmur heard. Pulmonary/Chest: Effort normal and breath sounds normal. No respiratory distress. She has no wheezes. She has no rales. She exhibits no tenderness.  Abdominal: Soft. She exhibits no distension and no mass. There is no tenderness. There is no rebound.  Musculoskeletal: Normal range of motion. She exhibits no edema or tenderness.  Lymphadenopathy:    She has no cervical adenopathy.  Neurological: She is alert and oriented to person, place, and time. No cranial nerve deficit. She exhibits normal muscle tone. Coordination normal.  Skin: Skin is warm and dry. No rash noted. She is not diaphoretic. No erythema.  Psychiatric: Affect and judgment normal.  .   CMP Latest Ref Rng &  Units 05/07/2018  Glucose 65 - 99 mg/dL 139(H)  BUN 6 - 20 mg/dL 19  Creatinine 0.44 - 1.00 mg/dL 1.31(H)  Sodium 135 - 145 mmol/L 141  Potassium 3.5 - 5.1 mmol/L 3.4(L)  Chloride 101 - 111 mmol/L 106  CO2 22 - 32 mmol/L 25  Calcium 8.9 - 10.3 mg/dL 8.9  Total Protein 6.5 - 8.1 g/dL 6.7  Total Bilirubin 0.3 - 1.2 mg/dL 1.1  Alkaline Phos 38 - 126 U/L 37(L)  AST 15 - 41 U/L 21  ALT 14 - 54 U/L 9(L)   CBC Latest Ref Rng & Units 11/16/2018  WBC 4.0 - 10.5 K/uL 6.6  Hemoglobin 12.0 - 15.0 g/dL 11.6(L)  Hematocrit 36.0 - 46.0 % 34.7(L)  Platelets 150 - 400 K/uL 256     Bone marrow biopsy pathology 10/26/2017 Diagnosis   Assessment and plan Patient is a 82 y.o. female who is on low-dose methotrexate chronically presented with pancytopenia, chest pain and shortness of breath. 1. Anemia due to vitamin B12 deficiency, unspecified B12 deficiency type   2. B12 deficiency    #labs reviewed and discussed with patient.  Hemoglobin slightly decreased.  Continue to montior.   # History of B12 deficiency, positive intrinsic factor. Continue monthly Vitamin b12 1033mg monthly.   Follow up in 3 months repeat cbc, cmp,  b12,folate, iron ferritin.    ZEarlie Server MD, PhD Hematology Oncology [facility]Griff[facility] Hospitalat APhiladeLPhia Va Medical CenterPager- [phone]/06/19

## 2018-11-18 NOTE — Progress Notes (Signed)
Patient here for follow up. Pt states she has trouble swallowing at times. Patient's blood pressure is elevated, denies headache or dizziness.

## 2018-11-28 DIAGNOSIS — J181 Lobar pneumonia, unspecified organism: Secondary | ICD-10-CM | POA: Diagnosis not present

## 2018-11-28 DIAGNOSIS — R0982 Postnasal drip: Secondary | ICD-10-CM | POA: Diagnosis not present

## 2018-11-28 DIAGNOSIS — R05 Cough: Secondary | ICD-10-CM | POA: Diagnosis not present

## 2018-11-28 DIAGNOSIS — R918 Other nonspecific abnormal finding of lung field: Secondary | ICD-10-CM | POA: Diagnosis not present

## 2018-12-06 DIAGNOSIS — J189 Pneumonia, unspecified organism: Secondary | ICD-10-CM | POA: Diagnosis not present

## 2018-12-06 DIAGNOSIS — J181 Lobar pneumonia, unspecified organism: Secondary | ICD-10-CM | POA: Diagnosis not present

## 2018-12-06 DIAGNOSIS — E119 Type 2 diabetes mellitus without complications: Secondary | ICD-10-CM | POA: Diagnosis not present

## 2018-12-08 ENCOUNTER — Other Ambulatory Visit: Payer: Self-pay | Admitting: Oncology

## 2018-12-08 DIAGNOSIS — D696 Thrombocytopenia, unspecified: Secondary | ICD-10-CM

## 2018-12-08 DIAGNOSIS — D709 Neutropenia, unspecified: Secondary | ICD-10-CM

## 2018-12-20 ENCOUNTER — Inpatient Hospital Stay: Payer: Medicare HMO | Attending: Oncology

## 2018-12-20 DIAGNOSIS — E538 Deficiency of other specified B group vitamins: Secondary | ICD-10-CM | POA: Diagnosis not present

## 2018-12-20 DIAGNOSIS — D61818 Other pancytopenia: Secondary | ICD-10-CM | POA: Insufficient documentation

## 2018-12-20 MED ORDER — CYANOCOBALAMIN 1000 MCG/ML IJ SOLN
1000.0000 ug | Freq: Once | INTRAMUSCULAR | Status: AC
  Administered 2018-12-20: 1000 ug via INTRAMUSCULAR

## 2018-12-24 DIAGNOSIS — K219 Gastro-esophageal reflux disease without esophagitis: Secondary | ICD-10-CM | POA: Diagnosis not present

## 2018-12-24 DIAGNOSIS — R1310 Dysphagia, unspecified: Secondary | ICD-10-CM | POA: Diagnosis not present

## 2019-01-14 ENCOUNTER — Ambulatory Visit
Admission: RE | Admit: 2019-01-14 | Discharge: 2019-01-14 | Disposition: A | Discharge status: Home / Self Care | Payer: Medicare HMO | Attending: Unknown Physician Specialty | Admitting: Unknown Physician Specialty

## 2019-01-14 ENCOUNTER — Encounter
Admission: RE | Disposition: A | Discharge status: Home / Self Care | Payer: Self-pay | Source: Home / Self Care | Attending: Unknown Physician Specialty

## 2019-01-14 ENCOUNTER — Ambulatory Visit: Payer: Medicare HMO | Admitting: Anesthesiology

## 2019-01-14 ENCOUNTER — Encounter: Payer: Self-pay | Admitting: Anesthesiology

## 2019-01-14 DIAGNOSIS — Z79899 Other long term (current) drug therapy: Secondary | ICD-10-CM | POA: Diagnosis not present

## 2019-01-14 DIAGNOSIS — R1310 Dysphagia, unspecified: Secondary | ICD-10-CM | POA: Insufficient documentation

## 2019-01-14 DIAGNOSIS — Z7984 Long term (current) use of oral hypoglycemic drugs: Secondary | ICD-10-CM | POA: Diagnosis not present

## 2019-01-14 DIAGNOSIS — M069 Rheumatoid arthritis, unspecified: Secondary | ICD-10-CM | POA: Insufficient documentation

## 2019-01-14 DIAGNOSIS — M170 Bilateral primary osteoarthritis of knee: Secondary | ICD-10-CM | POA: Diagnosis not present

## 2019-01-14 DIAGNOSIS — E119 Type 2 diabetes mellitus without complications: Secondary | ICD-10-CM | POA: Diagnosis not present

## 2019-01-14 DIAGNOSIS — K210 Gastro-esophageal reflux disease with esophagitis: Secondary | ICD-10-CM | POA: Insufficient documentation

## 2019-01-14 DIAGNOSIS — I10 Essential (primary) hypertension: Secondary | ICD-10-CM | POA: Diagnosis not present

## 2019-01-14 DIAGNOSIS — K222 Esophageal obstruction: Secondary | ICD-10-CM | POA: Diagnosis not present

## 2019-01-14 HISTORY — PX: ESOPHAGOGASTRODUODENOSCOPY: SHX5428

## 2019-01-14 LAB — GLUCOSE, CAPILLARY: Glucose-Capillary: 103 mg/dL — ABNORMAL HIGH (ref 70–99)

## 2019-01-14 SURGERY — EGD (ESOPHAGOGASTRODUODENOSCOPY)
Anesthesia: General

## 2019-01-14 MED ORDER — PROPOFOL 500 MG/50ML IV EMUL
INTRAVENOUS | Status: DC | PRN
  Administered 2019-01-14: 50 ug/kg/min via INTRAVENOUS

## 2019-01-14 MED ORDER — SODIUM CHLORIDE 0.9 % IV SOLN: INTRAVENOUS | Status: DC

## 2019-01-14 MED ORDER — LIDOCAINE HCL (PF) 1 % IJ SOLN
INTRAMUSCULAR | Status: AC
  Administered 2019-01-14: 0.3 mL
  Filled 2019-01-14: qty 2

## 2019-01-14 MED ORDER — LIDOCAINE HCL (PF) 2 % IJ SOLN
INTRAMUSCULAR | Status: DC | PRN
  Administered 2019-01-14: 50 mg

## 2019-01-14 MED ORDER — PROPOFOL 10 MG/ML IV BOLUS
INTRAVENOUS | Status: DC | PRN
  Administered 2019-01-14: 20 mg via INTRAVENOUS
  Administered 2019-01-14: 10 mg via INTRAVENOUS
  Administered 2019-01-14: 20 mg via INTRAVENOUS

## 2019-01-14 MED ORDER — SODIUM CHLORIDE 0.9 % IV SOLN
INTRAVENOUS | Status: DC
  Administered 2019-01-14: 1000 mL via INTRAVENOUS

## 2019-01-14 NOTE — Anesthesia Preprocedure Evaluation (Addendum)
Anesthesia Evaluation  Patient identified by MRN, date of birth, ID band Patient awake    Reviewed: Allergy & Precautions, NPO status , Patient's Chart, lab work & pertinent test results, reviewed documented beta blocker date and time   History of Anesthesia Complications Negative for: history of anesthetic complications  Airway Mallampati: II  TM Distance: >3 FB     Dental  (+) Upper Dentures, Lower Dentures, Dental Advidsory Given   Pulmonary neg shortness of breath, pneumonia, resolved, neg recent URI,           Cardiovascular Exercise Tolerance: Good hypertension, Pt. on medications (-) angina(-) Past MI (-) dysrhythmias      Neuro/Psych negative neurological ROS  negative psych ROS   GI/Hepatic Neg liver ROS, GERD  ,  Endo/Other  diabetes, Type 2  Renal/GU negative Renal ROS  negative genitourinary   Musculoskeletal  (+) Arthritis ,   Abdominal   Peds  Hematology  (+) Blood dyscrasia, anemia ,   Anesthesia Other Findings Past Medical History: No date: Diabetes mellitus without complication (HCC) No date: Dysphagia No date: Esophageal stricture     Comment:  a. s/p dilation x 7; b. 10/14/2017 EGD: benign appearing              esophageal stenosis (severe) w/ adherent scar tissue in               distal esophagus. No date: GERD (gastroesophageal reflux disease) No date: Hypertension No date: Osteoarthritis     Comment:  knees No date: Rheumatoid arthritis (HCC) No date: Thyroid nodule   Reproductive/Obstetrics negative OB ROS                            Anesthesia Physical  Anesthesia Plan  ASA: III  Anesthesia Plan: General   Post-op Pain Management:    Induction: Intravenous  PONV Risk Score and Plan: 3 and Propofol infusion and TIVA  Airway Management Planned: Nasal Cannula and Natural Airway  Additional Equipment:   Intra-op Plan:   Post-operative Plan:    Informed Consent: I have reviewed the patients History and Physical, chart, labs and discussed the procedure including the risks, benefits and alternatives for the proposed anesthesia with the patient or authorized representative who has indicated his/her understanding and acceptance.       Plan Discussed with: CRNA  Anesthesia Plan Comments:         Anesthesia Quick Evaluation

## 2019-01-14 NOTE — Transfer of Care (Signed)
Immediate Anesthesia Transfer of Care Note  Patient: Lisa Hernandez  Procedure(s) Performed: ESOPHAGOGASTRODUODENOSCOPY (EGD) (N/A )  Patient Location: PACU  Anesthesia Type:General  Level of Consciousness: sedated  Airway & Oxygen Therapy: Patient Spontanous Breathing and Patient connected to nasal cannula oxygen  Post-op Assessment: Report given to RN and Post -op Vital signs reviewed and stable  Post vital signs: Reviewed and stable  Last Vitals:  Vitals Value Taken Time  BP 164/88 01/14/2019  8:44 AM  Temp 36.2 C 01/14/2019  8:44 AM  Pulse 72 01/14/2019  8:45 AM  Resp 17 01/14/2019  8:45 AM  SpO2 100 % 01/14/2019  8:45 AM  Vitals shown include unvalidated device data.  Last Pain:  Vitals:   01/14/19 0844  TempSrc: Tympanic  PainSc: 0-No pain         Complications: No apparent anesthesia complications

## 2019-01-14 NOTE — Op Note (Signed)
[facility] Gastroenterology Patient Name: Lisa Hernandez Procedure Date: 01/14/2019 8:23 AM MRN: [id number] Account #: [id number] Date of Birth: 1936-05-29 Admit Type: Outpatient Age: 83 Room: [facility] ENDO ROOM 1 Gender: Female Note Status: Finalized Procedure:            Upper GI endoscopy Indications:          Dysphagia Providers:            Manya Silvas, MD Referring MD:         [name] (Referring MD) Medicines:            Propofol per Anesthesia Complications:        No immediate complications. Procedure:            Pre-Anesthesia Assessment:                       - After reviewing the risks and benefits, the patient                        was deemed in satisfactory condition to undergo the                        procedure.                       - After reviewing the risks and benefits, the patient                        was deemed in satisfactory condition to undergo the                        procedure.                       After obtaining informed consent, the endoscope was                        passed under direct vision. Throughout the procedure,                        the patient's blood pressure, pulse, and oxygen                        saturations were monitored continuously. The Endoscope                        was introduced through the mouth, and advanced to the                        second part of duodenum. The upper GI endoscopy was                        accomplished without difficulty. The patient tolerated                        the procedure well. Findings:      LA Grade C (one or more mucosal breaks continuous between tops of 2 or       more mucosal folds, less than 75% circumference) esophagitis with no       bleeding was found 38 cm from the incisors. At the end of the procedure  A guidewire was placed and the scope was withdrawn. Dilation was       performed with a Savary dilator with mild resistance at 12.8 mm, 13 mm     and 15 mm.      Diffuse moderately erythematous mucosa without bleeding was found in the       gastric body and in the gastric antrum.      The examined duodenum was normal. Impression:           - LA Grade C reflux esophagitis. Dilated.                       - Erythematous mucosa in the gastric body and antrum.                       - Normal examined duodenum.                       - No specimens collected. Recommendation:       - The findings and recommendations were discussed with                        the patient's family. Manya Silvas, MD 01/14/2019 8:43:59 AM This report has been signed electronically. Number of Addenda: 0 Note Initiated On: 01/14/2019 8:23 AM      [facility]

## 2019-01-14 NOTE — Anesthesia Post-op Follow-up Note (Signed)
Anesthesia QCDR form completed.        

## 2019-01-14 NOTE — H&P (Signed)
Primary Care Physician:  Leonel Ramsay, MD Primary Gastroenterologist:  Dr. [name]  Pre-Procedure History & Physical: HPI:  Lisa Hernandez is a 83 y.o. female is here for an endoscopy.   Past Medical History:  Diagnosis Date  . Diabetes mellitus without complication ([place])   . Dysphagia   . Esophageal stricture    a. s/p dilation x 7; b. 10/14/2017 EGD: benign appearing esophageal stenosis (severe) w/ adherent scar tissue in distal esophagus.  Marland Kitchen GERD (gastroesophageal reflux disease)   . Hypertension   . Osteoarthritis    knees  . Rheumatoid arthritis ([place])   . Thyroid nodule     Past Surgical History:  Procedure Laterality Date  . ABDOMINAL HYSTERECTOMY    . adenomatous colon polyps  08/25/2016  . CHOLECYSTECTOMY    . COLONOSCOPY    . ESOPHAGOGASTRODUODENOSCOPY (EGD) WITH PROPOFOL N/A 10/03/2015   Procedure: ESOPHAGOGASTRODUODENOSCOPY (EGD) WITH PROPOFOL;  Surgeon: Manya Silvas, MD;  Location: [facility] ENDOSCOPY;  Service: Endoscopy;  Laterality: N/A;  . ESOPHAGOGASTRODUODENOSCOPY (EGD) WITH PROPOFOL N/A 10/25/2015   Procedure: ESOPHAGOGASTRODUODENOSCOPY (EGD) WITH PROPOFOL;  Surgeon: Manya Silvas, MD;  Location: [facility] ENDOSCOPY;  Service: Endoscopy;  Laterality: N/A;   IDDM  . ESOPHAGOGASTRODUODENOSCOPY (EGD) WITH PROPOFOL N/A 10/27/2016   Procedure: ESOPHAGOGASTRODUODENOSCOPY (EGD) WITH PROPOFOL;  Surgeon: Manya Silvas, MD;  Location: [facility] ENDOSCOPY;  Service: Endoscopy;  Laterality: N/A;  Diabetic  . ESOPHAGOGASTRODUODENOSCOPY (EGD) WITH PROPOFOL N/A 10/14/2017   Procedure: ESOPHAGOGASTRODUODENOSCOPY (EGD) WITH PROPOFOL;  Surgeon: Manya Silvas, MD;  Location: [facility] ENDOSCOPY;  Service: Endoscopy;  Laterality: N/A;  . ESOPHAGOGASTRODUODENOSCOPY (EGD) WITH PROPOFOL N/A 11/13/2017   Procedure: ESOPHAGOGASTRODUODENOSCOPY (EGD) WITH PROPOFOL;  Surgeon: Manya Silvas, MD;  Location: [facility] ENDOSCOPY;  Service: Endoscopy;  Laterality: N/A;  .  ESOPHAGOGASTRODUODENOSCOPY (EGD) WITH PROPOFOL N/A 05/24/2018   Procedure: ESOPHAGOGASTRODUODENOSCOPY (EGD) WITH PROPOFOL;  Surgeon: Manya Silvas, MD;  Location: [facility] ENDOSCOPY;  Service: Endoscopy;  Laterality: N/A;  . SAVORY DILATION N/A 10/03/2015   Procedure: SAVORY DILATION;  Surgeon: Manya Silvas, MD;  Location: [facility] ENDOSCOPY;  Service: Endoscopy;  Laterality: N/A;  . SAVORY DILATION N/A 10/25/2015   Procedure: SAVORY DILATION;  Surgeon: Manya Silvas, MD;  Location: [facility] ENDOSCOPY;  Service: Endoscopy;  Laterality: N/A;  . TONSILLECTOMY      Prior to Admission medications   Medication Sig Start Date End Date Taking? Authorizing Provider  atenolol (TENORMIN) 25 MG tablet Take 1 tablet 2 (two) times daily by mouth. 10/09/17 01/14/19 Yes [provider]  folic acid (FOLVITE) 1 MG tablet TAKE 1 TABLET BY MOUTH EVERY DAY 12/09/18   Sindy Guadeloupe, MD  furosemide (LASIX) 20 MG tablet TAKE ONE (1) TABLET EACH DAY 05/03/18   [provider]  gabapentin (NEURONTIN) 100 MG capsule Take 100 mg by mouth 2 (two) times daily.    [provider]  hydroxychloroquine (PLAQUENIL) 200 MG tablet Take 200 mg by mouth daily. 07/12/18   [provider]  lisinopril (PRINIVIL,ZESTRIL) 10 MG tablet Take 10 mg 2 (two) times daily by mouth.     [provider]  meclizine (ANTIVERT) 12.5 MG tablet Take 12.5 mg by mouth 3 (three) times daily as needed for dizziness.    [provider]  metFORMIN (GLUCOPHAGE) 500 MG tablet Take 500 mg by mouth. Take 2 tabs in am, 1 tab in pm    [provider]  Jonetta Speak LANCETS 56O MISC 3 daily    [provider]  pantoprazole (  PROTONIX) 20 MG tablet Take 20 mg by mouth daily.    [provider]  potassium chloride (K-DUR) 10 MEQ tablet TAKE 1 TABLET (10 MEQ TOTAL) BY MOUTH ONCE DAILY 05/03/18   [provider]  traZODone (DESYREL) 50 MG tablet TAKE 1 TABLET (50 MG TOTAL) BY MOUTH  NIGHTLY AS NEEDED FOR SLEEP 05/05/18   [provider]    Allergies as of 12/24/2018 - Review Complete 11/18/2018  Allergen Reaction Noted  . Erythromycin ethylsuccinate [erythromycin] Other (See Comments) 10/02/2015    Family History  Problem Relation Age of Onset  . Diabetes Mother   . Cancer Mother        died in her 54s  . Diabetes Father   . Heart attack Father        died @ 80  . Diabetes Sister     Social History   Socioeconomic History  . Marital status: Divorced    Spouse name: Not on file  . Number of children: Not on file  . Years of education: Not on file  . Highest education level: Not on file  Occupational History  . Occupation: retired  Scientific laboratory technician  . Financial resource strain: Not on file  . Food insecurity:    Worry: Not on file    Inability: Not on file  . Transportation needs:    Medical: Not on file    Non-medical: Not on file  Tobacco Use  . Smoking status: Never Smoker  . Smokeless tobacco: Never Used  Substance and Sexual Activity  . Alcohol use: No  . Drug use: No  . Sexual activity: Never  Lifestyle  . Physical activity:    Days per week: Not on file    Minutes per session: Not on file  . Stress: Not on file  Relationships  . Social connections:    Talks on phone: Not on file    Gets together: Not on file    Attends religious service: Not on file    Active member of club or organization: Not on file    Attends meetings of clubs or organizations: Not on file    Relationship status: Not on file  . Intimate partner violence:    Fear of current or ex partner: Not on file    Emotionally abused: Not on file    Physically abused: Not on file    Forced sexual activity: Not on file  Other Topics Concern  . Not on file  Social History Narrative   Lives in [place] by herself.  Sister nearby.  Active.    Review of Systems: See HPI, otherwise negative ROS  Physical Exam: Pulse (!) 55   Temp (!) 96.7 F (35.9 C) (Tympanic)    Resp 16   Ht 5\' 1"  (1.549 m)   Wt 54.9 kg   SpO2 100%   BMI 22.86 kg/m  General:   Alert,  pleasant and cooperative in NAD Head:  Normocephalic and atraumatic. Neck:  Supple; no masses or thyromegaly. Lungs:  Clear throughout to auscultation.    Heart:  Regular rate and rhythm. Abdomen:  Soft, nontender and nondistended. Normal bowel sounds, without guarding, and without rebound.   Neurologic:  Alert and  oriented x4;  grossly normal neurologically.  Impression/Plan: [name] is here for an endoscopy to be performed for dysphagia.  Last dilatation was 05/2018  Risks, benefits, limitations, and alternatives regarding  endoscopy have been reviewed with the patient.  Questions have been answered.  All  parties agreeable.   Gaylyn Cheers, MD  01/14/2019, 8:21 AM

## 2019-01-14 NOTE — Anesthesia Postprocedure Evaluation (Signed)
Anesthesia Post Note  Patient: [name]  Procedure(s) Performed: ESOPHAGOGASTRODUODENOSCOPY (EGD) (N/A )  Patient location during evaluation: Endoscopy Anesthesia Type: General Level of consciousness: awake and alert Pain management: pain level controlled Vital Signs Assessment: post-procedure vital signs reviewed and stable Respiratory status: spontaneous breathing, nonlabored ventilation, respiratory function stable and patient connected to nasal cannula oxygen Cardiovascular status: blood pressure returned to baseline and stable Postop Assessment: no apparent nausea or vomiting Anesthetic complications: no     Last Vitals:  Vitals:   01/14/19 0914 01/14/19 0924  BP: (!) 193/77 (!) 204/104  Pulse: 67 71  Resp: 19 (!) 22  Temp:    SpO2: 100% 100%    Last Pain:  Vitals:   01/14/19 0924  TempSrc:   PainSc: 0-No pain                 Martha Clan

## 2019-01-17 ENCOUNTER — Encounter: Payer: Self-pay | Admitting: Unknown Physician Specialty

## 2019-01-20 ENCOUNTER — Inpatient Hospital Stay: Payer: Medicare HMO

## 2019-01-24 ENCOUNTER — Inpatient Hospital Stay: Payer: Medicare HMO | Attending: Oncology

## 2019-01-24 DIAGNOSIS — E538 Deficiency of other specified B group vitamins: Secondary | ICD-10-CM | POA: Diagnosis not present

## 2019-01-24 DIAGNOSIS — D61818 Other pancytopenia: Secondary | ICD-10-CM | POA: Diagnosis not present

## 2019-01-24 MED ORDER — CYANOCOBALAMIN 1000 MCG/ML IJ SOLN
1000.0000 ug | Freq: Once | INTRAMUSCULAR | Status: AC
  Administered 2019-01-24: 1000 ug via INTRAMUSCULAR

## 2019-02-15 ENCOUNTER — Inpatient Hospital Stay: Payer: Medicare HMO | Attending: Oncology

## 2019-02-15 DIAGNOSIS — D61818 Other pancytopenia: Secondary | ICD-10-CM | POA: Insufficient documentation

## 2019-02-15 DIAGNOSIS — E538 Deficiency of other specified B group vitamins: Secondary | ICD-10-CM | POA: Diagnosis not present

## 2019-02-15 DIAGNOSIS — D519 Vitamin B12 deficiency anemia, unspecified: Secondary | ICD-10-CM

## 2019-02-15 LAB — IRON AND TIBC
Iron: 103 ug/dL (ref 28–170)
Saturation Ratios: 38 % — ABNORMAL HIGH (ref 10.4–31.8)
TIBC: 271 ug/dL (ref 250–450)
UIBC: 168 ug/dL

## 2019-02-15 LAB — COMPREHENSIVE METABOLIC PANEL
ALBUMIN: 4.1 g/dL (ref 3.5–5.0)
ALT: 10 U/L (ref 0–44)
AST: 15 U/L (ref 15–41)
Alkaline Phosphatase: 48 U/L (ref 38–126)
Anion gap: 6 (ref 5–15)
BUN: 23 mg/dL (ref 8–23)
CO2: 23 mmol/L (ref 22–32)
Calcium: 9.1 mg/dL (ref 8.9–10.3)
Chloride: 111 mmol/L (ref 98–111)
Creatinine, Ser: 1.39 mg/dL — ABNORMAL HIGH (ref 0.44–1.00)
GFR calc Af Amer: 41 mL/min — ABNORMAL LOW (ref >60)
GFR calc non Af Amer: 36 mL/min — ABNORMAL LOW (ref >60)
Glucose, Bld: 136 mg/dL — ABNORMAL HIGH (ref 70–99)
Potassium: 3.7 mmol/L (ref 3.5–5.1)
Sodium: 140 mmol/L (ref 135–145)
Total Bilirubin: 0.8 mg/dL (ref 0.3–1.2)
Total Protein: 7.3 g/dL (ref 6.5–8.1)

## 2019-02-15 LAB — CBC WITH DIFFERENTIAL/PLATELET
Abs Immature Granulocytes: 0.02 10*3/uL (ref 0.00–0.07)
Basophils Absolute: 0 10*3/uL (ref 0.0–0.1)
Basophils Relative: 0 %
Eosinophils Absolute: 0.2 10*3/uL (ref 0.0–0.5)
Eosinophils Relative: 2 %
HCT: 36.8 % (ref 36.0–46.0)
Hemoglobin: 12.5 g/dL (ref 12.0–15.0)
Immature Granulocytes: 0 %
LYMPHS ABS: 1.9 10*3/uL (ref 0.7–4.0)
Lymphocytes Relative: 30 %
MCH: 30.5 pg (ref 26.0–34.0)
MCHC: 34 g/dL (ref 30.0–36.0)
MCV: 89.8 fL (ref 80.0–100.0)
MONOS PCT: 8 %
Monocytes Absolute: 0.5 10*3/uL (ref 0.1–1.0)
Neutro Abs: 3.7 10*3/uL (ref 1.7–7.7)
Neutrophils Relative %: 60 %
Platelets: 237 10*3/uL (ref 150–400)
RBC: 4.1 MIL/uL (ref 3.87–5.11)
RDW: 14.2 % (ref 11.5–15.5)
WBC: 6.3 10*3/uL (ref 4.0–10.5)
nRBC: 0 % (ref 0.0–0.2)

## 2019-02-15 LAB — VITAMIN B12: Vitamin B-12: 562 pg/mL (ref 180–914)

## 2019-02-15 LAB — FERRITIN: Ferritin: 92 ng/mL (ref 11–307)

## 2019-02-17 ENCOUNTER — Ambulatory Visit: Payer: BC Managed Care – PPO

## 2019-02-17 ENCOUNTER — Ambulatory Visit: Payer: BC Managed Care – PPO | Admitting: Oncology

## 2019-02-17 ENCOUNTER — Inpatient Hospital Stay: Payer: Medicare HMO

## 2019-02-17 ENCOUNTER — Inpatient Hospital Stay: Payer: Medicare HMO | Attending: Oncology | Admitting: Oncology

## 2019-02-17 ENCOUNTER — Other Ambulatory Visit: Payer: Self-pay

## 2019-02-17 ENCOUNTER — Encounter: Payer: Self-pay | Admitting: Oncology

## 2019-02-17 VITALS — BP 163/82 | HR 51 | Temp 96.6°F | Resp 18 | Wt 125.8 lb

## 2019-02-17 DIAGNOSIS — E119 Type 2 diabetes mellitus without complications: Secondary | ICD-10-CM | POA: Diagnosis not present

## 2019-02-17 DIAGNOSIS — E538 Deficiency of other specified B group vitamins: Secondary | ICD-10-CM

## 2019-02-17 DIAGNOSIS — D519 Vitamin B12 deficiency anemia, unspecified: Secondary | ICD-10-CM

## 2019-02-17 DIAGNOSIS — K209 Esophagitis, unspecified without bleeding: Secondary | ICD-10-CM

## 2019-02-17 DIAGNOSIS — Z794 Long term (current) use of insulin: Secondary | ICD-10-CM | POA: Diagnosis not present

## 2019-02-17 DIAGNOSIS — D61818 Other pancytopenia: Secondary | ICD-10-CM | POA: Insufficient documentation

## 2019-02-17 DIAGNOSIS — M069 Rheumatoid arthritis, unspecified: Secondary | ICD-10-CM | POA: Diagnosis not present

## 2019-02-17 DIAGNOSIS — Z79899 Other long term (current) drug therapy: Secondary | ICD-10-CM | POA: Diagnosis not present

## 2019-02-17 DIAGNOSIS — D510 Vitamin B12 deficiency anemia due to intrinsic factor deficiency: Secondary | ICD-10-CM | POA: Insufficient documentation

## 2019-02-17 DIAGNOSIS — I10 Essential (primary) hypertension: Secondary | ICD-10-CM | POA: Insufficient documentation

## 2019-02-17 MED ORDER — CYANOCOBALAMIN 1000 MCG/ML IJ SOLN
1000.0000 ug | Freq: Once | INTRAMUSCULAR | Status: AC
  Administered 2019-02-17: 1000 ug via INTRAMUSCULAR
  Filled 2019-02-17: qty 1

## 2019-02-17 NOTE — Progress Notes (Signed)
Patient here for follow up. Pt states she gets tired easily. Blood pressure elevated, pt denies lightheadedness or dizziness.

## 2019-02-17 NOTE — Progress Notes (Signed)
Hematology/Oncology Follow Up Note [facility]  Telephone:([phone] [phone]  Patient Care Team: Leonel Ramsay, MD as PCP - General (Infectious Diseases)   Name of the patient: Lisa Hernandez  [id number]  12-May-1936   REASON FOR VISIT Follow up for pancytopenia and vitamin B12 defieicney. Marland Kitchen   HISTORY OF PRESENT ILLNESS This is a 83 year old female with past medical history listed as below including rheumatoid arthritis on chronic methotrexate initially presented to emergency room for evaluation of chest pain, SOB, generalized weakness.She was found to have pancytopenia. Chest pain and EKG changes, likely demand mismatch She takes low dose MTX for rheumatoid arthritis for many years. Denies any new medication or herbal supplementation.  Work up revealed folate deficiency and B12 deficiency and she was started on oral folate supplementation and IM B12 injections.  Patient denies any fever or chills. She has a history of esophageal stricture and she is status post a data dictation last week. She denies any new medications except atenolol was added to her management is recently. Denies any herbal medication or supplementation. She denies drinking any alcohol. She has some weight loss because of not being able to eat well due to the esophageal stricture.  Bone marrow biopsy 10/26/2017 showed  Bone Marrow, Aspirate,Biopsy, and Clot, left iliac - HYPERCELLULAR BONE MARROW WITH DYSPOIETIC CHANGES. - SEE COMMENT. PERIPHERAL BLOOD: - PANCYTOPENIA. Diagnosis Note The bone marrow is hypercellular with trilineage hematopoiesis but with relative abundance of erythroid precursors. Myeloid cell lines display dyspoietic changes including megaloblastoid changes but with no increase in blastic cells. I suspect that the changes are likely related to the patients known low vitamin B12 and folate levels in addition to methotrexate treatment. In this setting, it is  extremely difficult to assess for the presence or absence of myelodysplastic syndrome. Re-evaluation after vitamin B12 and folate treatment is recommended. Cytogenetic studies may also be helpful in this regard. (BNS:gt, 10/27/17)  She has chronic intermittent remittent dysphagia due to recurrent esophageal stricture.  This has not changed.  Denies any bleeding, melena or easy bruising. During the interval she has got another upper endoscopy on 05/24/2018 which showed esophagitis and a GE junction stricture, esophageal dilatation was performed.  INTERVAL HISTORY 83 y.o. female follows up for management of vitamin B12 deficiency.  She reports feeling tired easily. Otherwise no new complaints.  During the interval she has had upper endoscopy done on 01/14/2019. Endoscopy finding LA grade C reflux esophagitis.  Dilated.  Erythematous mucosa in the gastric body and antrum.  Normal examined duodenum.  No specimens collected.   Review of Systems  Constitutional: Positive for malaise/fatigue. Negative for chills, fever and weight loss.  HENT: Negative for congestion, ear discharge, ear pain, hearing loss, nosebleeds, sinus pain and sore throat.   Eyes: Negative for blurred vision, double vision, photophobia, pain, discharge and redness.  Respiratory: Negative for cough, hemoptysis, sputum production, shortness of breath and wheezing.   Cardiovascular: Negative for chest pain, palpitations, orthopnea, claudication and leg swelling.  Gastrointestinal: Negative for abdominal pain, blood in stool, constipation, diarrhea, heartburn, melena, nausea and vomiting.  Genitourinary: Negative for dysuria, flank pain, frequency and hematuria.  Musculoskeletal: Positive for joint pain. Negative for back pain, myalgias and neck pain.  Skin: Negative for itching and rash.  Neurological: Negative for dizziness, tingling, tremors, focal weakness, weakness and headaches.  Endo/Heme/Allergies: Negative for environmental  allergies. Does not bruise/bleed easily.  Psychiatric/Behavioral: Negative for depression, hallucinations and substance abuse. The patient is not  nervous/anxious.      Allergies  Allergen Reactions  . Erythromycin Ethylsuccinate [Erythromycin] Other (See Comments)    GI upset     Past Medical History:  Diagnosis Date  . Diabetes mellitus without complication ([place])   . Dysphagia   . Esophageal stricture    a. s/p dilation x 7; b. 10/14/2017 EGD: benign appearing esophageal stenosis (severe) w/ adherent scar tissue in distal esophagus.  Marland Kitchen GERD (gastroesophageal reflux disease)   . Hypertension   . Osteoarthritis    knees  . Rheumatoid arthritis ([place])   . Thyroid nodule      Past Surgical History:  Procedure Laterality Date  . ABDOMINAL HYSTERECTOMY    . adenomatous colon polyps  08/25/2016  . CHOLECYSTECTOMY    . COLONOSCOPY    . ESOPHAGOGASTRODUODENOSCOPY N/A 01/14/2019   Procedure: ESOPHAGOGASTRODUODENOSCOPY (EGD);  Surgeon: Manya Silvas, MD;  Location: [facility] ENDOSCOPY;  Service: Endoscopy;  Laterality: N/A;  . ESOPHAGOGASTRODUODENOSCOPY (EGD) WITH PROPOFOL N/A 10/03/2015   Procedure: ESOPHAGOGASTRODUODENOSCOPY (EGD) WITH PROPOFOL;  Surgeon: Manya Silvas, MD;  Location: [facility] ENDOSCOPY;  Service: Endoscopy;  Laterality: N/A;  . ESOPHAGOGASTRODUODENOSCOPY (EGD) WITH PROPOFOL N/A 10/25/2015   Procedure: ESOPHAGOGASTRODUODENOSCOPY (EGD) WITH PROPOFOL;  Surgeon: Manya Silvas, MD;  Location: [facility] ENDOSCOPY;  Service: Endoscopy;  Laterality: N/A;   IDDM  . ESOPHAGOGASTRODUODENOSCOPY (EGD) WITH PROPOFOL N/A 10/27/2016   Procedure: ESOPHAGOGASTRODUODENOSCOPY (EGD) WITH PROPOFOL;  Surgeon: Manya Silvas, MD;  Location: [facility] ENDOSCOPY;  Service: Endoscopy;  Laterality: N/A;  Diabetic  . ESOPHAGOGASTRODUODENOSCOPY (EGD) WITH PROPOFOL N/A 10/14/2017   Procedure: ESOPHAGOGASTRODUODENOSCOPY (EGD) WITH PROPOFOL;  Surgeon: Manya Silvas, MD;  Location: [facility] ENDOSCOPY;   Service: Endoscopy;  Laterality: N/A;  . ESOPHAGOGASTRODUODENOSCOPY (EGD) WITH PROPOFOL N/A 11/13/2017   Procedure: ESOPHAGOGASTRODUODENOSCOPY (EGD) WITH PROPOFOL;  Surgeon: Manya Silvas, MD;  Location: [facility] ENDOSCOPY;  Service: Endoscopy;  Laterality: N/A;  . ESOPHAGOGASTRODUODENOSCOPY (EGD) WITH PROPOFOL N/A 05/24/2018   Procedure: ESOPHAGOGASTRODUODENOSCOPY (EGD) WITH PROPOFOL;  Surgeon: Manya Silvas, MD;  Location: [facility] ENDOSCOPY;  Service: Endoscopy;  Laterality: N/A;  . SAVORY DILATION N/A 10/03/2015   Procedure: SAVORY DILATION;  Surgeon: Manya Silvas, MD;  Location: [facility] ENDOSCOPY;  Service: Endoscopy;  Laterality: N/A;  . SAVORY DILATION N/A 10/25/2015   Procedure: SAVORY DILATION;  Surgeon: Manya Silvas, MD;  Location: [facility] ENDOSCOPY;  Service: Endoscopy;  Laterality: N/A;  . TONSILLECTOMY      Social History   Socioeconomic History  . Marital status: Divorced    Spouse name: Not on file  . Number of children: Not on file  . Years of education: Not on file  . Highest education level: Not on file  Occupational History  . Occupation: retired  Scientific laboratory technician  . Financial resource strain: Not on file  . Food insecurity:    Worry: Not on file    Inability: Not on file  . Transportation needs:    Medical: Not on file    Non-medical: Not on file  Tobacco Use  . Smoking status: Never Smoker  . Smokeless tobacco: Never Used  Substance and Sexual Activity  . Alcohol use: No  . Drug use: No  . Sexual activity: Never  Lifestyle  . Physical activity:    Days per week: Not on file    Minutes per session: Not on file  . Stress: Not on file  Relationships  . Social connections:    Talks on phone: Not on file    Gets together: Not on file  Attends religious service: Not on file    Active member of club or organization: Not on file    Attends meetings of clubs or organizations: Not on file    Relationship status: Not on file  . Intimate partner violence:     Fear of current or ex partner: Not on file    Emotionally abused: Not on file    Physically abused: Not on file    Forced sexual activity: Not on file  Other Topics Concern  . Not on file  Social History Narrative   Lives in [place] by herself.  Sister nearby.  Active.    Family History  Problem Relation Age of Onset  . Diabetes Mother   . Cancer Mother        died in her 20s  . Diabetes Father   . Heart attack Father        died @ 17  . Diabetes Sister      Current Outpatient Medications:  .  folic acid (FOLVITE) 1 MG tablet, TAKE 1 TABLET BY MOUTH EVERY DAY, Disp: 30 tablet, Rfl: 2 .  furosemide (LASIX) 20 MG tablet, TAKE ONE (1) TABLET EACH DAY, Disp: , Rfl:  .  gabapentin (NEURONTIN) 100 MG capsule, Take 100 mg by mouth 2 (two) times daily., Disp: , Rfl:  .  hydroxychloroquine (PLAQUENIL) 200 MG tablet, Take 200 mg by mouth daily., Disp: , Rfl: 1 .  lisinopril (PRINIVIL,ZESTRIL) 10 MG tablet, Take 10 mg 2 (two) times daily by mouth. , Disp: , Rfl:  .  meclizine (ANTIVERT) 12.5 MG tablet, Take 12.5 mg by mouth 3 (three) times daily as needed for dizziness., Disp: , Rfl:  .  metFORMIN (GLUCOPHAGE) 500 MG tablet, Take 500 mg by mouth. Take 2 tabs in am, 1 tab in pm, Disp: , Rfl:  .  ONETOUCH DELICA LANCETS 71Q MISC, 3 daily, Disp: , Rfl:  .  pantoprazole (PROTONIX) 20 MG tablet, Take 20 mg by mouth daily., Disp: , Rfl:  .  potassium chloride (K-DUR) 10 MEQ tablet, TAKE 1 TABLET (10 MEQ TOTAL) BY MOUTH ONCE DAILY, Disp: , Rfl: 11 .  traZODone (DESYREL) 50 MG tablet, TAKE 1 TABLET (50 MG TOTAL) BY MOUTH NIGHTLY AS NEEDED FOR SLEEP, Disp: , Rfl: 3 .  atenolol (TENORMIN) 25 MG tablet, Take 1 tablet 2 (two) times daily by mouth., Disp: , Rfl:   Physical exam: ECOG 1 Vitals:   02/17/19 1013  BP: (!) 163/82  Pulse: (!) 51  Resp: 18  Temp: (!) 96.6 F (35.9 C)  TempSrc: Tympanic  Weight: 125 lb 12.8 oz (57.1 kg)   Physical Exam  Constitutional: She is oriented to person,  place, and time. No distress.  Frail elderly lady  HENT:  Head: Normocephalic and atraumatic.  Nose: Nose normal.  Mouth/Throat: Oropharynx is clear and moist. No oropharyngeal exudate.  Eyes: Pupils are equal, round, and reactive to light. EOM are normal. Left eye exhibits no discharge. No scleral icterus.  Neck: Normal range of motion. Neck supple. No JVD present.  Cardiovascular: Normal rate, regular rhythm and normal heart sounds.  No murmur heard. Pulmonary/Chest: Effort normal and breath sounds normal. No respiratory distress. She has no wheezes. She has no rales. She exhibits no tenderness.  Abdominal: Soft. She exhibits no distension and no mass. There is no abdominal tenderness. There is no rebound.  Musculoskeletal: Normal range of motion.        General: No tenderness or edema.  Lymphadenopathy:  She has no cervical adenopathy.  Neurological: She is alert and oriented to person, place, and time. No cranial nerve deficit. She exhibits normal muscle tone. Coordination normal.  Skin: Skin is warm and dry. No rash noted. She is not diaphoretic. No erythema.  Psychiatric: Affect and judgment normal.  .   CMP Latest Ref Rng & Units 02/15/2019  Glucose 70 - 99 mg/dL 136(H)  BUN 8 - 23 mg/dL 23  Creatinine 0.44 - 1.00 mg/dL 1.39(H)  Sodium 135 - 145 mmol/L 140  Potassium 3.5 - 5.1 mmol/L 3.7  Chloride 98 - 111 mmol/L 111  CO2 22 - 32 mmol/L 23  Calcium 8.9 - 10.3 mg/dL 9.1  Total Protein 6.5 - 8.1 g/dL 7.3  Total Bilirubin 0.3 - 1.2 mg/dL 0.8  Alkaline Phos 38 - 126 U/L 48  AST 15 - 41 U/L 15  ALT 0 - 44 U/L 10   CBC Latest Ref Rng & Units 02/15/2019  WBC 4.0 - 10.5 K/uL 6.3  Hemoglobin 12.0 - 15.0 g/dL 12.5  Hematocrit 36.0 - 46.0 % 36.8  Platelets 150 - 400 K/uL 237    Assessment and plan Patient is a 83 y.o. female who is on low-dose methotrexate chronically presented with pancytopenia, chest pain and shortness of breath. 1. B12 deficiency   2. Anemia due to vitamin  B12 deficiency, unspecified B12 deficiency type   3. Pernicious anemia   4. Esophagitis    Labs are reviewed and discussed with patient. Her hemoglobin has completely resolved.  Pancytopenia has also resolved. Vitamin B12 level at 562. Recommend patient to continue vitamin B12 1000 MCG injection every 3 months. Iron panel reviewed.  Both elevated iron saturation and ferritin has improved  # History of B12 deficiency, positive intrinsic factor. Continue monthly Vitamin b12 1051mg monthly.  # Esophagitis: continue protonix.  Follow up in 6 months to repeat cbc, cmp, b12,folate, iron ferritin.  Follow up in 12 months, labs, MD assessment + Vitamin B12 injection.    ZEarlie Server MD, PhD Hematology Oncology CJohn[facility] Surgery Center Seriesat AHudson HospitalPager- [phone]/05/20

## 2019-03-14 ENCOUNTER — Other Ambulatory Visit: Payer: Self-pay | Admitting: Oncology

## 2019-03-14 DIAGNOSIS — D696 Thrombocytopenia, unspecified: Secondary | ICD-10-CM

## 2019-03-14 DIAGNOSIS — D709 Neutropenia, unspecified: Secondary | ICD-10-CM

## 2019-04-18 DIAGNOSIS — R1310 Dysphagia, unspecified: Secondary | ICD-10-CM | POA: Diagnosis not present

## 2019-04-18 DIAGNOSIS — K2970 Gastritis, unspecified, without bleeding: Secondary | ICD-10-CM | POA: Diagnosis not present

## 2019-04-18 DIAGNOSIS — K210 Gastro-esophageal reflux disease with esophagitis: Secondary | ICD-10-CM | POA: Diagnosis not present

## 2019-05-19 ENCOUNTER — Other Ambulatory Visit: Payer: Self-pay

## 2019-05-20 ENCOUNTER — Inpatient Hospital Stay: Payer: Medicare HMO | Attending: Oncology

## 2019-05-20 ENCOUNTER — Other Ambulatory Visit: Payer: Self-pay

## 2019-05-20 DIAGNOSIS — M069 Rheumatoid arthritis, unspecified: Secondary | ICD-10-CM | POA: Insufficient documentation

## 2019-05-20 DIAGNOSIS — E119 Type 2 diabetes mellitus without complications: Secondary | ICD-10-CM | POA: Diagnosis not present

## 2019-05-20 DIAGNOSIS — E538 Deficiency of other specified B group vitamins: Secondary | ICD-10-CM

## 2019-05-20 DIAGNOSIS — D61818 Other pancytopenia: Secondary | ICD-10-CM | POA: Insufficient documentation

## 2019-05-20 DIAGNOSIS — I10 Essential (primary) hypertension: Secondary | ICD-10-CM | POA: Diagnosis not present

## 2019-05-20 DIAGNOSIS — Z79899 Other long term (current) drug therapy: Secondary | ICD-10-CM | POA: Insufficient documentation

## 2019-05-20 DIAGNOSIS — Z794 Long term (current) use of insulin: Secondary | ICD-10-CM | POA: Diagnosis not present

## 2019-05-20 DIAGNOSIS — D510 Vitamin B12 deficiency anemia due to intrinsic factor deficiency: Secondary | ICD-10-CM | POA: Diagnosis not present

## 2019-05-20 MED ORDER — CYANOCOBALAMIN 1000 MCG/ML IJ SOLN
1000.0000 ug | Freq: Once | INTRAMUSCULAR | Status: AC
  Administered 2019-05-20: 1000 ug via INTRAMUSCULAR

## 2019-05-22 ENCOUNTER — Other Ambulatory Visit: Payer: Self-pay | Admitting: Oncology

## 2019-05-22 DIAGNOSIS — D709 Neutropenia, unspecified: Secondary | ICD-10-CM

## 2019-05-22 DIAGNOSIS — D696 Thrombocytopenia, unspecified: Secondary | ICD-10-CM

## 2019-05-31 DIAGNOSIS — E119 Type 2 diabetes mellitus without complications: Secondary | ICD-10-CM | POA: Diagnosis not present

## 2019-06-06 DIAGNOSIS — M0579 Rheumatoid arthritis with rheumatoid factor of multiple sites without organ or systems involvement: Secondary | ICD-10-CM | POA: Diagnosis not present

## 2019-06-06 DIAGNOSIS — M8949 Other hypertrophic osteoarthropathy, multiple sites: Secondary | ICD-10-CM | POA: Diagnosis not present

## 2019-06-07 DIAGNOSIS — E119 Type 2 diabetes mellitus without complications: Secondary | ICD-10-CM | POA: Diagnosis not present

## 2019-06-07 DIAGNOSIS — K210 Gastro-esophageal reflux disease with esophagitis: Secondary | ICD-10-CM | POA: Diagnosis not present

## 2019-06-07 DIAGNOSIS — D61818 Other pancytopenia: Secondary | ICD-10-CM | POA: Diagnosis not present

## 2019-06-07 DIAGNOSIS — N182 Chronic kidney disease, stage 2 (mild): Secondary | ICD-10-CM | POA: Diagnosis not present

## 2019-06-07 DIAGNOSIS — Z0000 Encounter for general adult medical examination without abnormal findings: Secondary | ICD-10-CM | POA: Diagnosis not present

## 2019-07-14 DIAGNOSIS — M545 Low back pain: Secondary | ICD-10-CM | POA: Diagnosis not present

## 2019-07-18 ENCOUNTER — Other Ambulatory Visit: Payer: Self-pay | Admitting: Physician Assistant

## 2019-07-18 ENCOUNTER — Other Ambulatory Visit: Payer: Self-pay | Admitting: Orthopedic Surgery

## 2019-07-18 DIAGNOSIS — S32010A Wedge compression fracture of first lumbar vertebra, initial encounter for closed fracture: Secondary | ICD-10-CM | POA: Diagnosis not present

## 2019-07-19 ENCOUNTER — Other Ambulatory Visit: Payer: Self-pay

## 2019-07-19 ENCOUNTER — Ambulatory Visit
Admission: RE | Admit: 2019-07-19 | Discharge: 2019-07-19 | Disposition: A | Discharge status: Home / Self Care | Payer: Medicare HMO | Source: Ambulatory Visit | Attending: Orthopedic Surgery | Admitting: Orthopedic Surgery

## 2019-07-19 DIAGNOSIS — S32010A Wedge compression fracture of first lumbar vertebra, initial encounter for closed fracture: Secondary | ICD-10-CM | POA: Diagnosis not present

## 2019-07-19 DIAGNOSIS — M545 Low back pain: Secondary | ICD-10-CM | POA: Diagnosis not present

## 2019-07-20 ENCOUNTER — Other Ambulatory Visit: Payer: Self-pay

## 2019-07-20 ENCOUNTER — Encounter
Admission: RE | Admit: 2019-07-20 | Discharge: 2019-07-20 | Disposition: A | Discharge status: Home / Self Care | Payer: Medicare HMO | Source: Ambulatory Visit | Attending: Orthopedic Surgery | Admitting: Orthopedic Surgery

## 2019-07-20 DIAGNOSIS — Z79899 Other long term (current) drug therapy: Secondary | ICD-10-CM | POA: Diagnosis not present

## 2019-07-20 DIAGNOSIS — E119 Type 2 diabetes mellitus without complications: Secondary | ICD-10-CM | POA: Diagnosis not present

## 2019-07-20 DIAGNOSIS — D7589 Other specified diseases of blood and blood-forming organs: Secondary | ICD-10-CM | POA: Diagnosis not present

## 2019-07-20 DIAGNOSIS — M170 Bilateral primary osteoarthritis of knee: Secondary | ICD-10-CM | POA: Diagnosis not present

## 2019-07-20 DIAGNOSIS — M069 Rheumatoid arthritis, unspecified: Secondary | ICD-10-CM | POA: Diagnosis not present

## 2019-07-20 DIAGNOSIS — M4156 Other secondary scoliosis, lumbar region: Secondary | ICD-10-CM | POA: Diagnosis not present

## 2019-07-20 DIAGNOSIS — Z20828 Contact with and (suspected) exposure to other viral communicable diseases: Secondary | ICD-10-CM | POA: Diagnosis not present

## 2019-07-20 DIAGNOSIS — I10 Essential (primary) hypertension: Secondary | ICD-10-CM | POA: Diagnosis not present

## 2019-07-20 DIAGNOSIS — Z7984 Long term (current) use of oral hypoglycemic drugs: Secondary | ICD-10-CM | POA: Diagnosis not present

## 2019-07-20 DIAGNOSIS — X501XXA Overexertion from prolonged static or awkward postures, initial encounter: Secondary | ICD-10-CM | POA: Diagnosis not present

## 2019-07-20 DIAGNOSIS — S32010A Wedge compression fracture of first lumbar vertebra, initial encounter for closed fracture: Secondary | ICD-10-CM | POA: Diagnosis not present

## 2019-07-20 DIAGNOSIS — K210 Gastro-esophageal reflux disease with esophagitis: Secondary | ICD-10-CM | POA: Diagnosis not present

## 2019-07-20 DIAGNOSIS — Y93H9 Activity, other involving exterior property and land maintenance, building and construction: Secondary | ICD-10-CM | POA: Diagnosis not present

## 2019-07-20 HISTORY — DX: Dyspnea, unspecified: R06.00

## 2019-07-20 HISTORY — DX: Chronic kidney disease, unspecified: N18.9

## 2019-07-20 HISTORY — DX: Dorsalgia, unspecified: M54.9

## 2019-07-20 HISTORY — DX: Dizziness and giddiness: R42

## 2019-07-20 LAB — SARS CORONAVIRUS 2 (TAT 6-24 HRS): SARS Coronavirus 2: NEGATIVE

## 2019-07-20 NOTE — Patient Instructions (Addendum)
Your procedure is scheduled on: 07/21/2019 Thurs at 11:00 am Report to Same Day Surgery 2nd floor medical mall [facility] Entrance-take elevator on left to 2nd floor.  Check in with surgery information desk.)  Remember: Instructions that are not followed completely may result in serious medical risk, up to and including death, or upon the discretion of your surgeon and anesthesiologist your surgery may need to be rescheduled.    _x___ 1. Do not eat food after midnight the night before your procedure. You may drink clear liquids up to 2 hours before you are scheduled to arrive at the hospital for your procedure.  Do not drink clear liquids within 2 hours of your scheduled arrival to the hospital.  Clear liquids include  --Water or Apple juice without pulp  --Clear carbohydrate beverage such as ClearFast or Gatorade  --Black Coffee or Clear Tea (No milk, no creamers, do not add anything to                  the coffee or Tea Type 1 and type 2 diabetics should only drink water.   ____Ensure clear carbohydrate drink on the way to the hospital for bariatric patients  ____Ensure clear carbohydrate drink 3 hours before surgery.   No gum chewing or hard candies.     __x__ 2. No Alcohol for 24 hours before or after surgery.   __x__3. No Smoking or e-cigarettes for 24 prior to surgery.  Do not use any chewable tobacco products for at least 6 hour prior to surgery   ____  4. Bring all medications with you on the day of surgery if instructed.    __x__ 5. Notify your doctor if there is any change in your medical condition     (cold, fever, infections).    x___6. On the morning of surgery brush your teeth with toothpaste and water.  You may rinse your mouth with mouth wash if you wish.  Do not swallow any toothpaste or mouthwash.   Do not wear jewelry, make-up, hairpins, clips or nail polish.  Do not wear lotions, powders, or perfumes. You may wear deodorant.  Do not shave 48 hours prior to  surgery. Men may shave face and neck.  Do not bring valuables to the hospital.    [facility] is not responsible for any belongings or valuables.               Contacts, dentures or bridgework may not be worn into surgery.  Leave your suitcase in the car. After surgery it may be brought to your room.  For patients admitted to the hospital, discharge time is determined by your                       treatment team.  _  Patients discharged the day of surgery will not be allowed to drive home.  You will need someone to drive you home and stay with you the night of your procedure.    Please read over the following fact sheets that you were given:   [facility] Preparing for Surgery and or MRSA Information   _x___ Take anti-hypertensive listed below, cardiac, seizure, asthma,     anti-reflux and psychiatric medicines. These include:  1. atenolol (TENORMIN) 25 MG tablet  2.gabapentin (NEURONTIN) 100 MG capsule  3.pantoprazole (PROTONIX) 40 MG tablet  4.traMADol (ULTRAM) 50 MG tablet if needed  5.hydroxychloroquine (PLAQUENIL) 200 MG tablet  6.  ____Fleets enema or Magnesium Citrate as  directed.   _x___ Use CHG Soap or sage wipes as directed on instruction sheet   ____ Use inhalers on the day of surgery and bring to hospital day of surgery  __x__ Stop Metformin today  ____ Take 1/2 of usual insulin dose the night before surgery and none on the morning     surgery.   _x___ Follow recommendations from Cardiologist, Pulmonologist or PCP regarding          stopping Aspirin, Coumadin, Plavix ,Eliquis, Effient, or Pradaxa, and Pletal.  X____Stop Anti-inflammatories such as Advil, Aleve, Ibuprofen, Motrin, Naproxen, Naprosyn, Goodies powders or aspirin products. OK to take Tylenol and                          Celebrex.   _x___ Stop supplements until after surgery.  But may continue Vitamin D, Vitamin B,       and multivitamin.   ____ Bring C-Pap to the hospital.

## 2019-07-20 NOTE — Pre-Procedure Instructions (Signed)
Secure chat with Dr Randa Lynn regarding abnormal EKG.

## 2019-07-20 NOTE — Pre-Procedure Instructions (Signed)
Called Dr Randa Lynn regarding EKG.   "It should be OK to proceed."

## 2019-07-21 ENCOUNTER — Encounter
Admission: RE | Disposition: A | Discharge status: Home / Self Care | Payer: Self-pay | Source: Home / Self Care | Attending: Orthopedic Surgery

## 2019-07-21 ENCOUNTER — Encounter: Payer: Self-pay | Admitting: *Deleted

## 2019-07-21 ENCOUNTER — Ambulatory Visit: Payer: Medicare HMO | Admitting: Anesthesiology

## 2019-07-21 ENCOUNTER — Ambulatory Visit
Admission: RE | Admit: 2019-07-21 | Discharge: 2019-07-21 | Disposition: A | Discharge status: Home / Self Care | Payer: Medicare HMO | Attending: Orthopedic Surgery | Admitting: Orthopedic Surgery

## 2019-07-21 ENCOUNTER — Ambulatory Visit: Payer: Medicare HMO

## 2019-07-21 ENCOUNTER — Other Ambulatory Visit: Payer: Self-pay

## 2019-07-21 DIAGNOSIS — S32010A Wedge compression fracture of first lumbar vertebra, initial encounter for closed fracture: Secondary | ICD-10-CM | POA: Diagnosis not present

## 2019-07-21 DIAGNOSIS — Y93H9 Activity, other involving exterior property and land maintenance, building and construction: Secondary | ICD-10-CM | POA: Insufficient documentation

## 2019-07-21 DIAGNOSIS — M4156 Other secondary scoliosis, lumbar region: Secondary | ICD-10-CM | POA: Diagnosis not present

## 2019-07-21 DIAGNOSIS — Z981 Arthrodesis status: Secondary | ICD-10-CM | POA: Diagnosis not present

## 2019-07-21 DIAGNOSIS — K210 Gastro-esophageal reflux disease with esophagitis: Secondary | ICD-10-CM | POA: Insufficient documentation

## 2019-07-21 DIAGNOSIS — X501XXA Overexertion from prolonged static or awkward postures, initial encounter: Secondary | ICD-10-CM | POA: Insufficient documentation

## 2019-07-21 DIAGNOSIS — Z419 Encounter for procedure for purposes other than remedying health state, unspecified: Secondary | ICD-10-CM

## 2019-07-21 DIAGNOSIS — E119 Type 2 diabetes mellitus without complications: Secondary | ICD-10-CM | POA: Diagnosis not present

## 2019-07-21 DIAGNOSIS — M170 Bilateral primary osteoarthritis of knee: Secondary | ICD-10-CM | POA: Insufficient documentation

## 2019-07-21 DIAGNOSIS — Z7984 Long term (current) use of oral hypoglycemic drugs: Secondary | ICD-10-CM | POA: Insufficient documentation

## 2019-07-21 DIAGNOSIS — Z20828 Contact with and (suspected) exposure to other viral communicable diseases: Secondary | ICD-10-CM | POA: Insufficient documentation

## 2019-07-21 DIAGNOSIS — S32019A Unspecified fracture of first lumbar vertebra, initial encounter for closed fracture: Secondary | ICD-10-CM | POA: Diagnosis not present

## 2019-07-21 DIAGNOSIS — D7589 Other specified diseases of blood and blood-forming organs: Secondary | ICD-10-CM | POA: Insufficient documentation

## 2019-07-21 DIAGNOSIS — M069 Rheumatoid arthritis, unspecified: Secondary | ICD-10-CM | POA: Insufficient documentation

## 2019-07-21 DIAGNOSIS — I10 Essential (primary) hypertension: Secondary | ICD-10-CM | POA: Insufficient documentation

## 2019-07-21 DIAGNOSIS — K219 Gastro-esophageal reflux disease without esophagitis: Secondary | ICD-10-CM | POA: Diagnosis not present

## 2019-07-21 DIAGNOSIS — Z79899 Other long term (current) drug therapy: Secondary | ICD-10-CM | POA: Insufficient documentation

## 2019-07-21 HISTORY — PX: KYPHOPLASTY: SHX5884

## 2019-07-21 LAB — GLUCOSE, CAPILLARY
Glucose-Capillary: 92 mg/dL (ref 70–99)
Glucose-Capillary: 92 mg/dL (ref 70–99)

## 2019-07-21 SURGERY — KYPHOPLASTY
Anesthesia: General | Site: Back

## 2019-07-21 MED ORDER — LIDOCAINE HCL (CARDIAC) PF 100 MG/5ML IV SOSY
PREFILLED_SYRINGE | INTRAVENOUS | Status: DC | PRN
  Administered 2019-07-21: 50 mg via INTRATRACHEAL

## 2019-07-21 MED ORDER — LIDOCAINE HCL 1 % IJ SOLN
INTRAMUSCULAR | Status: DC | PRN
  Administered 2019-07-21: 20 mL

## 2019-07-21 MED ORDER — LIDOCAINE HCL (PF) 1 % IJ SOLN
INTRAMUSCULAR | Status: AC
  Filled 2019-07-21: qty 60

## 2019-07-21 MED ORDER — HYDROCODONE-ACETAMINOPHEN 7.5-325 MG PO TABS
1.0000 | ORAL_TABLET | ORAL | Status: DC | PRN
  Filled 2019-07-21: qty 2

## 2019-07-21 MED ORDER — LIDOCAINE HCL (PF) 2 % IJ SOLN
INTRAMUSCULAR | Status: AC
  Filled 2019-07-21: qty 10

## 2019-07-21 MED ORDER — ONDANSETRON HCL 4 MG/2ML IJ SOLN
INTRAMUSCULAR | Status: DC | PRN
  Administered 2019-07-21: 4 mg via INTRAVENOUS

## 2019-07-21 MED ORDER — FENTANYL CITRATE (PF) 100 MCG/2ML IJ SOLN
INTRAMUSCULAR | Status: AC
  Filled 2019-07-21: qty 2

## 2019-07-21 MED ORDER — FENTANYL CITRATE (PF) 100 MCG/2ML IJ SOLN
INTRAMUSCULAR | Status: DC | PRN
  Administered 2019-07-21 (×3): 25 ug via INTRAVENOUS

## 2019-07-21 MED ORDER — SODIUM CHLORIDE 0.9 % IV SOLN
INTRAVENOUS | Status: DC
  Administered 2019-07-21: 10:00:00 via INTRAVENOUS

## 2019-07-21 MED ORDER — TRAMADOL HCL 50 MG PO TABS: 50.0000 mg | ORAL_TABLET | Freq: Four times a day (QID) | ORAL | 0 refills | Status: DC | PRN

## 2019-07-21 MED ORDER — ACETAMINOPHEN 325 MG PO TABS: 325.0000 mg | ORAL_TABLET | Freq: Four times a day (QID) | ORAL | Status: DC | PRN

## 2019-07-21 MED ORDER — ONDANSETRON HCL 4 MG PO TABS: 4.0000 mg | ORAL_TABLET | Freq: Four times a day (QID) | ORAL | Status: DC | PRN

## 2019-07-21 MED ORDER — DEXAMETHASONE SODIUM PHOSPHATE 10 MG/ML IJ SOLN
INTRAMUSCULAR | Status: DC | PRN
  Administered 2019-07-21: 10 mg via INTRAVENOUS

## 2019-07-21 MED ORDER — BUPIVACAINE-EPINEPHRINE (PF) 0.5% -1:200000 IJ SOLN
INTRAMUSCULAR | Status: AC
  Filled 2019-07-21: qty 30

## 2019-07-21 MED ORDER — DEXAMETHASONE SODIUM PHOSPHATE 10 MG/ML IJ SOLN
INTRAMUSCULAR | Status: AC
  Filled 2019-07-21: qty 1

## 2019-07-21 MED ORDER — FENTANYL CITRATE (PF) 100 MCG/2ML IJ SOLN
INTRAMUSCULAR | Status: AC
  Administered 2019-07-21: 13:00:00 25 ug via INTRAVENOUS
  Filled 2019-07-21: qty 2

## 2019-07-21 MED ORDER — CEFAZOLIN SODIUM-DEXTROSE 2-4 GM/100ML-% IV SOLN
2.0000 g | Freq: Once | INTRAVENOUS | Status: AC
  Administered 2019-07-21: 2 g via INTRAVENOUS

## 2019-07-21 MED ORDER — HYDROCODONE-ACETAMINOPHEN 5-325 MG PO TABS: 1.0000 | ORAL_TABLET | ORAL | Status: DC | PRN

## 2019-07-21 MED ORDER — PROPOFOL 500 MG/50ML IV EMUL
INTRAVENOUS | Status: AC
  Filled 2019-07-21: qty 50

## 2019-07-21 MED ORDER — TRAMADOL HCL 50 MG PO TABS: 50.0000 mg | ORAL_TABLET | Freq: Four times a day (QID) | ORAL | Status: DC

## 2019-07-21 MED ORDER — PROPOFOL 500 MG/50ML IV EMUL
INTRAVENOUS | Status: DC | PRN
  Administered 2019-07-21: 50 ug/kg/min via INTRAVENOUS

## 2019-07-21 MED ORDER — SODIUM CHLORIDE 0.9 % IV SOLN: INTRAVENOUS | Status: DC

## 2019-07-21 MED ORDER — ONDANSETRON HCL 4 MG/2ML IJ SOLN
INTRAMUSCULAR | Status: AC
  Filled 2019-07-21: qty 2

## 2019-07-21 MED ORDER — IOHEXOL 180 MG/ML  SOLN
INTRAMUSCULAR | Status: DC | PRN
  Administered 2019-07-21: 20 mL via INTRAVENOUS

## 2019-07-21 MED ORDER — BUPIVACAINE-EPINEPHRINE (PF) 0.5% -1:200000 IJ SOLN
INTRAMUSCULAR | Status: DC | PRN
  Administered 2019-07-21: 20 mL

## 2019-07-21 MED ORDER — ONDANSETRON HCL 4 MG/2ML IJ SOLN: 4.0000 mg | Freq: Once | INTRAMUSCULAR | Status: DC | PRN

## 2019-07-21 MED ORDER — CEFAZOLIN SODIUM-DEXTROSE 2-4 GM/100ML-% IV SOLN
INTRAVENOUS | Status: AC
  Filled 2019-07-21: qty 100

## 2019-07-21 MED ORDER — FENTANYL CITRATE (PF) 100 MCG/2ML IJ SOLN
25.0000 ug | INTRAMUSCULAR | Status: DC | PRN
  Administered 2019-07-21: 25 ug via INTRAVENOUS

## 2019-07-21 MED ORDER — METOCLOPRAMIDE HCL 10 MG PO TABS: 5.0000 mg | ORAL_TABLET | Freq: Three times a day (TID) | ORAL | Status: DC | PRN

## 2019-07-21 MED ORDER — ONDANSETRON HCL 4 MG/2ML IJ SOLN: 4.0000 mg | Freq: Four times a day (QID) | INTRAMUSCULAR | Status: DC | PRN

## 2019-07-21 MED ORDER — PROPOFOL 10 MG/ML IV BOLUS
INTRAVENOUS | Status: DC | PRN
  Administered 2019-07-21: 20 mg via INTRAVENOUS

## 2019-07-21 MED ORDER — METOCLOPRAMIDE HCL 5 MG/ML IJ SOLN: 5.0000 mg | Freq: Three times a day (TID) | INTRAMUSCULAR | Status: DC | PRN

## 2019-07-21 MED ORDER — MORPHINE SULFATE (PF) 4 MG/ML IV SOLN: 0.5000 mg | INTRAVENOUS | Status: DC | PRN

## 2019-07-21 MED ORDER — ATENOLOL 25 MG PO TABS: 25.0000 mg | ORAL_TABLET | Freq: Every day | ORAL | 33 refills | Status: AC

## 2019-07-21 SURGICAL SUPPLY — 20 items

## 2019-07-21 NOTE — H&P (Addendum)
Reviewed paper H+P, will be scanned into chart. No changes noted. MRI showed only L 1 compression

## 2019-07-21 NOTE — Transfer of Care (Signed)
Immediate Anesthesia Transfer of Care Note  Patient: Lisa Hernandez  Procedure(s) Performed: L1 KYPHOPLASTY (N/A Back)  Patient Location: PACU  Anesthesia Type:General  Level of Consciousness: awake, alert , oriented and patient cooperative  Airway & Oxygen Therapy: Patient Spontanous Breathing and Patient connected to nasal cannula oxygen  Post-op Assessment: Report given to RN and Post -op Vital signs reviewed and stable  Post vital signs: Reviewed and stable  Last Vitals:  Vitals Value Taken Time  BP 137/77 07/21/19 1217  Temp    Pulse 83 07/21/19 1217  Resp 12 07/21/19 1217  SpO2 100 % 07/21/19 1217  Vitals shown include unvalidated device data.  Last Pain:  Vitals:   07/21/19 1007  TempSrc: Tympanic  PainSc: 0-No pain         Complications: No apparent anesthesia complications

## 2019-07-21 NOTE — Op Note (Signed)
Date July 20, 2019  time 12:15 PM   PATIENT:  Lisa Hernandez   PRE-OPERATIVE DIAGNOSIS:  closed wedge compression fracture of L1   POST-OPERATIVE DIAGNOSIS:  closed wedge compression fracture of L1   PROCEDURE:  Procedure(s): KYPHOPLASTY L1  SURGEON: Laurene Footman, MD   ASSISTANTS: None   ANESTHESIA:   local and MAC   EBL:  No intake/output data recorded.   BLOOD ADMINISTERED:none   DRAINS: none    LOCAL MEDICATIONS USED:  MARCAINE    and XYLOCAINE    SPECIMEN:   L1 vertebral body   DISPOSITION OF SPECIMEN:  Pathology   COUNTS:  YES   TOURNIQUET:  * No tourniquets in log *   IMPLANTS: Bone cement   DICTATION: .Dragon Dictation  patient was brought to the operating room and after adequate anesthesia was obtained the patient was placed prone.  C arm was brought in in good visualization of the affected level obtained on both AP and lateral projections.  After patient identification and timeout procedures were completed, local anesthetic was infiltrated with 10 cc 1% Xylocaine infiltrated subcutaneously.  This is done the area on the right side of the planned approach.  The back was then prepped and draped in the usual sterile manner and repeat timeout procedure carried out.  A spinal needle was brought down to the pedicle on the right side of  L1 and a 50-50 mix of 1% Xylocaine half percent Sensorcaine with epinephrine total of 20 cc injected.  After allowing this to set a small incision was made and the trocar was advanced into the vertebral body in an extrapedicular fashion.  Biopsy was obtained Drilling was carried out balloon inserted with inflation to  for cc.  When the cement was appropriate consistency 4-1/2 cc were injected into the vertebral body without extravasation, good fill superior to inferior endplates and from right to left sides along the inferior endplate.  After the cement had set the trochar was removed and permanent C-arm views obtained.  The wound was closed with  Dermabond followed by Band-Aid   PLAN OF CARE: Discharge to home after PACU   PATIENT DISPOSITION:  PACU - hemodynamically stable.

## 2019-07-21 NOTE — Anesthesia Post-op Follow-up Note (Signed)
Anesthesia QCDR form completed.        

## 2019-07-21 NOTE — Anesthesia Postprocedure Evaluation (Signed)
Anesthesia Post Note  Patient: Lisa Hernandez  Procedure(s) Performed: L1 KYPHOPLASTY (N/A Back)  Patient location during evaluation: PACU Anesthesia Type: General Level of consciousness: awake and alert Pain management: pain level controlled Vital Signs Assessment: post-procedure vital signs reviewed and stable Respiratory status: spontaneous breathing and respiratory function stable Cardiovascular status: stable Anesthetic complications: no     Last Vitals:  Vitals:   07/21/19 1301 07/21/19 1316  BP: (!) 169/75 (!) 157/66  Pulse: 76 74  Resp: 18 17  Temp:  37.2 C  SpO2: 100% 100%    Last Pain:  Vitals:   07/21/19 1316  TempSrc:   PainSc: 2                  KEPHART,WILLIAM K

## 2019-07-21 NOTE — Anesthesia Preprocedure Evaluation (Signed)
Anesthesia Evaluation  Patient identified by MRN, date of birth, ID band Patient awake    Reviewed: NPO status , Patient's Chart, lab work & pertinent test results, reviewed documented beta blocker date and time   History of Anesthesia Complications Negative for: history of anesthetic complications  Airway Mallampati: III       Dental  (+) Upper Dentures, Lower Dentures   Pulmonary neg sleep apnea, neg COPD, Not current smoker,           Cardiovascular hypertension, Pt. on medications and Pt. on home beta blockers (-) Past MI and (-) CHF (-) dysrhythmias (-) Valvular Problems/Murmurs     Neuro/Psych neg Seizures    GI/Hepatic Neg liver ROS, GERD  Medicated and Controlled,  Endo/Other  diabetes, Type 2, Oral Hypoglycemic Agents  Renal/GU Renal InsufficiencyRenal disease     Musculoskeletal  (+) Arthritis ,   Abdominal   Peds  Hematology   Anesthesia Other Findings   Reproductive/Obstetrics                             Anesthesia Physical Anesthesia Plan  ASA: III  Anesthesia Plan: General   Post-op Pain Management:    Induction: Intravenous  PONV Risk Score and Plan: 3 and TIVA, Propofol infusion and Treatment may vary due to age or medical condition  Airway Management Planned: Nasal Cannula  Additional Equipment:   Intra-op Plan:   Post-operative Plan:   Informed Consent: I have reviewed the patients History and Physical, chart, labs and discussed the procedure including the risks, benefits and alternatives for the proposed anesthesia with the patient or authorized representative who has indicated his/her understanding and acceptance.       Plan Discussed with:   Anesthesia Plan Comments:         Anesthesia Quick Evaluation

## 2019-07-21 NOTE — Discharge Instructions (Addendum)
Remove Band-Aids on Saturday then okay to shower.  Take it easy today and tomorrow resume more normal activities on Saturday but no lifting over 5 pounds for 2 weeks.  Pain medicine as directed.  AMBULATORY SURGERY  DISCHARGE INSTRUCTIONS   1) The drugs that you were given will stay in your system until tomorrow so for the next 24 hours you should not:  A) Drive an automobile B) Make any legal decisions C) Drink any alcoholic beverage   2) You may resume regular meals tomorrow.  Today it is better to start with liquids and gradually work up to solid foods.  You may eat anything you prefer, but it is better to start with liquids, then soup and crackers, and gradually work up to solid foods.   3) Please notify your doctor immediately if you have any unusual bleeding, trouble breathing, redness and pain at the surgery site, drainage, fever, or pain not relieved by medication.    4) Additional Instructions:        Please contact your physician with any problems or Same Day Surgery at [phone], Monday through Friday 6 am to 4 pm, or [place] at [facility] number at [phone].

## 2019-07-25 LAB — SURGICAL PATHOLOGY

## 2019-08-03 DIAGNOSIS — S32010D Wedge compression fracture of first lumbar vertebra, subsequent encounter for fracture with routine healing: Secondary | ICD-10-CM | POA: Diagnosis not present

## 2019-08-15 DIAGNOSIS — M810 Age-related osteoporosis without current pathological fracture: Secondary | ICD-10-CM | POA: Diagnosis not present

## 2019-08-17 ENCOUNTER — Other Ambulatory Visit: Payer: Self-pay

## 2019-08-18 ENCOUNTER — Other Ambulatory Visit: Payer: Self-pay

## 2019-08-18 ENCOUNTER — Inpatient Hospital Stay: Payer: Medicare HMO | Attending: Oncology

## 2019-08-18 DIAGNOSIS — E538 Deficiency of other specified B group vitamins: Secondary | ICD-10-CM

## 2019-08-18 DIAGNOSIS — D510 Vitamin B12 deficiency anemia due to intrinsic factor deficiency: Secondary | ICD-10-CM | POA: Diagnosis not present

## 2019-08-18 MED ORDER — CYANOCOBALAMIN 1000 MCG/ML IJ SOLN
1000.0000 ug | Freq: Once | INTRAMUSCULAR | Status: AC
  Administered 2019-08-18: 1000 ug via INTRAMUSCULAR

## 2019-08-19 ENCOUNTER — Ambulatory Visit: Payer: Medicare HMO

## 2019-08-25 ENCOUNTER — Other Ambulatory Visit
Admission: RE | Admit: 2019-08-25 | Discharge: 2019-08-25 | Disposition: A | Discharge status: Home / Self Care | Payer: Medicare HMO | Source: Ambulatory Visit | Attending: Internal Medicine | Admitting: Internal Medicine

## 2019-08-25 ENCOUNTER — Other Ambulatory Visit: Payer: Self-pay

## 2019-08-25 DIAGNOSIS — Z20828 Contact with and (suspected) exposure to other viral communicable diseases: Secondary | ICD-10-CM | POA: Diagnosis not present

## 2019-08-25 DIAGNOSIS — Z01812 Encounter for preprocedural laboratory examination: Secondary | ICD-10-CM | POA: Insufficient documentation

## 2019-08-25 LAB — SARS CORONAVIRUS 2 (TAT 6-24 HRS): SARS Coronavirus 2: NEGATIVE

## 2019-08-29 ENCOUNTER — Encounter
Admission: RE | Disposition: A | Discharge status: Home / Self Care | Payer: Self-pay | Source: Home / Self Care | Attending: Internal Medicine

## 2019-08-29 ENCOUNTER — Ambulatory Visit: Payer: Medicare HMO | Admitting: Anesthesiology

## 2019-08-29 ENCOUNTER — Ambulatory Visit
Admission: RE | Admit: 2019-08-29 | Discharge: 2019-08-29 | Disposition: A | Discharge status: Home / Self Care | Payer: Medicare HMO | Attending: Internal Medicine | Admitting: Internal Medicine

## 2019-08-29 DIAGNOSIS — Z881 Allergy status to other antibiotic agents status: Secondary | ICD-10-CM | POA: Diagnosis not present

## 2019-08-29 DIAGNOSIS — R1310 Dysphagia, unspecified: Secondary | ICD-10-CM | POA: Diagnosis not present

## 2019-08-29 DIAGNOSIS — M069 Rheumatoid arthritis, unspecified: Secondary | ICD-10-CM | POA: Insufficient documentation

## 2019-08-29 DIAGNOSIS — D131 Benign neoplasm of stomach: Secondary | ICD-10-CM | POA: Insufficient documentation

## 2019-08-29 DIAGNOSIS — K2970 Gastritis, unspecified, without bleeding: Secondary | ICD-10-CM | POA: Diagnosis not present

## 2019-08-29 DIAGNOSIS — Z79899 Other long term (current) drug therapy: Secondary | ICD-10-CM | POA: Insufficient documentation

## 2019-08-29 DIAGNOSIS — I10 Essential (primary) hypertension: Secondary | ICD-10-CM | POA: Diagnosis not present

## 2019-08-29 DIAGNOSIS — M170 Bilateral primary osteoarthritis of knee: Secondary | ICD-10-CM | POA: Diagnosis not present

## 2019-08-29 DIAGNOSIS — I129 Hypertensive chronic kidney disease with stage 1 through stage 4 chronic kidney disease, or unspecified chronic kidney disease: Secondary | ICD-10-CM | POA: Diagnosis not present

## 2019-08-29 DIAGNOSIS — N189 Chronic kidney disease, unspecified: Secondary | ICD-10-CM | POA: Diagnosis not present

## 2019-08-29 DIAGNOSIS — K2210 Ulcer of esophagus without bleeding: Secondary | ICD-10-CM | POA: Insufficient documentation

## 2019-08-29 DIAGNOSIS — Z7984 Long term (current) use of oral hypoglycemic drugs: Secondary | ICD-10-CM | POA: Insufficient documentation

## 2019-08-29 DIAGNOSIS — K222 Esophageal obstruction: Secondary | ICD-10-CM | POA: Insufficient documentation

## 2019-08-29 DIAGNOSIS — E1122 Type 2 diabetes mellitus with diabetic chronic kidney disease: Secondary | ICD-10-CM | POA: Diagnosis not present

## 2019-08-29 DIAGNOSIS — K219 Gastro-esophageal reflux disease without esophagitis: Secondary | ICD-10-CM | POA: Insufficient documentation

## 2019-08-29 DIAGNOSIS — E119 Type 2 diabetes mellitus without complications: Secondary | ICD-10-CM | POA: Diagnosis not present

## 2019-08-29 HISTORY — PX: ESOPHAGOGASTRODUODENOSCOPY (EGD) WITH PROPOFOL: SHX5813

## 2019-08-29 LAB — GLUCOSE, CAPILLARY: Glucose-Capillary: 96 mg/dL (ref 70–99)

## 2019-08-29 SURGERY — ESOPHAGOGASTRODUODENOSCOPY (EGD) WITH PROPOFOL
Anesthesia: General

## 2019-08-29 MED ORDER — HYDRALAZINE HCL 20 MG/ML IJ SOLN
10.0000 mg | Freq: Once | INTRAMUSCULAR | Status: AC
  Administered 2019-08-29: 10 mg via INTRAVENOUS

## 2019-08-29 MED ORDER — PROPOFOL 500 MG/50ML IV EMUL
INTRAVENOUS | Status: AC
  Filled 2019-08-29: qty 50

## 2019-08-29 MED ORDER — GLYCOPYRROLATE 0.2 MG/ML IJ SOLN
INTRAMUSCULAR | Status: DC | PRN
  Administered 2019-08-29: 0.1 mg via INTRAVENOUS

## 2019-08-29 MED ORDER — METOPROLOL TARTRATE 5 MG/5ML IV SOLN
INTRAVENOUS | Status: DC | PRN
  Administered 2019-08-29: 2 mg via INTRAVENOUS
  Administered 2019-08-29: 3 mg via INTRAVENOUS

## 2019-08-29 MED ORDER — HYDRALAZINE HCL 20 MG/ML IJ SOLN
INTRAMUSCULAR | Status: AC
  Filled 2019-08-29: qty 1

## 2019-08-29 MED ORDER — SODIUM CHLORIDE 0.9 % IV SOLN
INTRAVENOUS | Status: DC
  Administered 2019-08-29: 10:00:00 1000 mL via INTRAVENOUS

## 2019-08-29 MED ORDER — METOPROLOL TARTRATE 5 MG/5ML IV SOLN
INTRAVENOUS | Status: AC
  Filled 2019-08-29: qty 5

## 2019-08-29 MED ORDER — PROPOFOL 500 MG/50ML IV EMUL
INTRAVENOUS | Status: DC | PRN
  Administered 2019-08-29: 50 ug/kg/min via INTRAVENOUS

## 2019-08-29 MED ORDER — LIDOCAINE HCL (PF) 2 % IJ SOLN
INTRAMUSCULAR | Status: AC
  Filled 2019-08-29: qty 10

## 2019-08-29 MED ORDER — HYDRALAZINE HCL 20 MG/ML IJ SOLN
10.0000 mg | Freq: Once | INTRAMUSCULAR | Status: AC
  Administered 2019-08-29: 13:00:00 10 mg via INTRAVENOUS

## 2019-08-29 MED ORDER — PROPOFOL 10 MG/ML IV BOLUS
INTRAVENOUS | Status: DC | PRN
  Administered 2019-08-29: 50 mg via INTRAVENOUS

## 2019-08-29 MED ORDER — GLYCOPYRROLATE 0.2 MG/ML IJ SOLN
INTRAMUSCULAR | Status: AC
  Filled 2019-08-29: qty 1

## 2019-08-29 MED ORDER — MIDAZOLAM HCL 2 MG/2ML IJ SOLN
INTRAMUSCULAR | Status: AC
  Filled 2019-08-29: qty 2

## 2019-08-29 MED ORDER — ONDANSETRON HCL 4 MG/2ML IJ SOLN
INTRAMUSCULAR | Status: DC | PRN
  Administered 2019-08-29: 4 mg via INTRAVENOUS

## 2019-08-29 NOTE — Anesthesia Post-op Follow-up Note (Signed)
Anesthesia QCDR form completed.        

## 2019-08-29 NOTE — Anesthesia Postprocedure Evaluation (Signed)
Anesthesia Post Note  Patient: Lisa Hernandez  Procedure(s) Performed: ESOPHAGOGASTRODUODENOSCOPY (EGD) WITH PROPOFOL (N/A )  Patient location during evaluation: Endoscopy Anesthesia Type: General Level of consciousness: awake and alert Pain management: pain level controlled Vital Signs Assessment: post-procedure vital signs reviewed and stable Respiratory status: spontaneous breathing and respiratory function stable Cardiovascular status: stable Anesthetic complications: no     Last Vitals:  Vitals:   08/29/19 1120 08/29/19 1140  BP: (!) 171/79 (!) 185/92  Pulse: 100 98  Resp: 20 (!) 22  Temp: (!) 35.9 C   SpO2: 97% 100%    Last Pain:  Vitals:   08/29/19 1120  TempSrc: Tympanic  PainSc:                  KEPHART,WILLIAM K

## 2019-08-29 NOTE — Anesthesia Preprocedure Evaluation (Signed)
Anesthesia Evaluation  Patient identified by MRN, date of birth, ID band Patient awake    Reviewed: Allergy & Precautions, NPO status , Patient's Chart, lab work & pertinent test results  History of Anesthesia Complications Negative for: history of anesthetic complications  Airway Mallampati: II       Dental   Pulmonary neg sleep apnea, neg COPD, Not current smoker,           Cardiovascular hypertension, Pt. on medications (-) Past MI and (-) CHF (-) dysrhythmias (-) Valvular Problems/Murmurs     Neuro/Psych neg Seizures    GI/Hepatic Neg liver ROS, GERD  Medicated,  Endo/Other  diabetes, Type 2, Oral Hypoglycemic Agents  Renal/GU Renal InsufficiencyRenal disease     Musculoskeletal   Abdominal   Peds  Hematology   Anesthesia Other Findings   Reproductive/Obstetrics                             Anesthesia Physical Anesthesia Plan  ASA: III  Anesthesia Plan: General   Post-op Pain Management:    Induction: Intravenous  PONV Risk Score and Plan: 3 and TIVA, Propofol infusion and Ondansetron  Airway Management Planned: Nasal Cannula  Additional Equipment:   Intra-op Plan:   Post-operative Plan:   Informed Consent: I have reviewed the patients History and Physical, chart, labs and discussed the procedure including the risks, benefits and alternatives for the proposed anesthesia with the patient or authorized representative who has indicated his/her understanding and acceptance.       Plan Discussed with:   Anesthesia Plan Comments:         Anesthesia Quick Evaluation

## 2019-08-29 NOTE — Op Note (Signed)
[facility] Gastroenterology Patient Name: Lisa Hernandez Procedure Date: 08/29/2019 10:58 AM MRN: [id number] Account #: [id number] Date of Birth: [birth year] Admit Type: Outpatient Age: 83 Room: [facility] ENDO ROOM 2 Gender: Female Note Status: Finalized Procedure:            Upper GI endoscopy Indications:          Dysphagia, Follow-up of esophageal reflux Providers:            Benay Pike. Alice Reichert MD, MD Referring MD:         [name] (Referring MD) Medicines:            Propofol per Anesthesia Complications:        No immediate complications. Estimated blood loss:                        Minimal. Procedure:            Pre-Anesthesia Assessment:                       - The risks and benefits of the procedure and the                        sedation options and risks were discussed with the                        patient. All questions were answered and informed                        consent was obtained.                       - Patient identification and proposed procedure were                        verified prior to the procedure by the nurse. The                        procedure was verified in the procedure room.                       - ASA Grade Assessment: III - A patient with severe                        systemic disease.                       - After reviewing the risks and benefits, the patient                        was deemed in satisfactory condition to undergo the                        procedure.                       After obtaining informed consent, the endoscope was                        passed under direct vision. Throughout the procedure,  the patient's blood pressure, pulse, and oxygen                        saturations were monitored continuously. The Endoscope                        was introduced through the mouth, and advanced to the                        third part of duodenum. The upper GI endoscopy was             somewhat difficult due to stricture. The patient                        tolerated the procedure well. Findings:      One benign-appearing, intrinsic severe (stenosis; an endoscope cannot       pass) stenosis was found at the gastroesophageal junction. This stenosis       measured 1 cm (inner diameter) x 1 cm (in length). The stenosis was       [Traversed after multiple biopsies of the stricture for diagnosis and       debulking]. Biopsies were taken with a cold forceps for histology.       Biopsies were taken with a cold forceps for histology.      Patchy moderate inflammation characterized by congestion (edema) and       erythema was found in the gastric body and in the gastric antrum.      The examined duodenum was normal. Impression:           - Benign-appearing esophageal stenosis. Biopsied.                       - Gastritis.                       - Normal examined duodenum. Recommendation:       - Patient has a contact number available for                        emergencies. The signs and symptoms of potential                        delayed complications were discussed with the patient.                        Return to normal activities tomorrow. Written discharge                        instructions were provided to the patient.                       - Resume previous diet.                       - Continue present medications.                       - Await pathology results.                       - Perform CT scan (computed tomography) of the chest  with contrast at appointment to be scheduled.                       - Return to nurse practitioner in 1 week.                       - You will need to follow up with Denice Paradise, NP at                        the [facility] GI office. Procedure Code(s):    --- Professional ---                       [id number], Esophagogastroduodenoscopy, flexible, transoral;                        with biopsy, single or  multiple Diagnosis Code(s):    --- Professional ---                       K21.9, Gastro-esophageal reflux disease without                        esophagitis                       R13.10, Dysphagia, unspecified                       K29.70, Gastritis, unspecified, without bleeding                       K22.2, Esophageal obstruction CPT copyright 2019 American Medical Association. All rights reserved. The codes documented in this report are preliminary and upon coder review may  be revised to meet current compliance requirements. Efrain Sella MD, MD 08/29/2019 11:27:42 AM This report has been signed electronically. Number of Addenda: 0 Note Initiated On: 08/29/2019 10:58 AM Estimated Blood Loss: Estimated blood loss was minimal.      [facility]

## 2019-08-29 NOTE — H&P (Signed)
Outpatient short stay form Pre-procedure 08/29/2019 11:10 AM Lisa Hernandez, M.D.  Primary Physician: [name], MD  Reason for visit: Dysphagia, recurrent  History of present illness: 83 year old female with a history of recurrent dysphagia and GERD presents for repeated symptoms of dysphagia to both solids and liquids.  Patient has received relief in the past with esophageal dilatation but symptoms always seem to be recurrent.  I have reviewed the last couple of procedures by Dr. [name] which showed no evident stricture or ring but dilation was carried through empirically.    Current Facility-Administered Medications:  .  0.9 %  sodium chloride infusion, , Intravenous, Continuous, [place], Benay Pike, MD, Last Rate: 20 mL/hr at 08/29/19 0955, 1,000 mL at 08/29/19 0955  Medications Prior to Admission  Medication Sig Dispense Refill Last Dose  . atenolol (TENORMIN) 25 MG tablet Take 1 tablet (25 mg total) by mouth daily. 30 tablet 33 08/29/2019 at 0600  . folic acid (FOLVITE) 1 MG tablet TAKE 1 TABLET BY MOUTH EVERY DAY (Patient taking differently: Take 1 mg by mouth daily. ) 30 tablet 2 08/28/2019 at Unknown time  . furosemide (LASIX) 20 MG tablet Take 20 mg by mouth daily.    08/28/2019 at Unknown time  . gabapentin (NEURONTIN) 100 MG capsule Take 100 mg by mouth 3 (three) times daily.    08/28/2019 at Unknown time  . hydroxychloroquine (PLAQUENIL) 200 MG tablet Take 200 mg by mouth daily.  1 08/28/2019 at Unknown time  . lisinopril (ZESTRIL) 20 MG tablet Take 20 mg by mouth daily.   08/29/2019 at 0600  . meclizine (ANTIVERT) 12.5 MG tablet Take 12.5 mg by mouth 3 (three) times daily as needed for dizziness.   08/28/2019 at Unknown time  . metFORMIN (GLUCOPHAGE) 500 MG tablet Take 500 mg by mouth daily.    08/28/2019 at Unknown time  . ONETOUCH DELICA LANCETS [id number] MISC 3 daily   08/28/2019 at Unknown time  . pantoprazole (PROTONIX) 40 MG tablet Take 40 mg by mouth 2 (two) times daily.    08/28/2019 at Unknown time  . potassium chloride (K-DUR) 10 MEQ tablet TAKE 1 TABLET (10 MEQ TOTAL) BY MOUTH ONCE DAILY  11 08/28/2019 at Unknown time  . traMADol (ULTRAM) 50 MG tablet Take 1 tablet (50 mg total) by mouth every 6 (six) hours as needed for up to 30 doses. 20 tablet 0 08/28/2019 at Unknown time  . traZODone (DESYREL) 50 MG tablet Take 50 mg by mouth at bedtime.   3 08/28/2019 at Unknown time     Allergies  Allergen Reactions  . Erythromycin Ethylsuccinate [Erythromycin] Other (See Comments)    GI upset     Past Medical History:  Diagnosis Date  . Back pain   . CRD (chronic renal disease)   . Diabetes mellitus without complication ([place])   . Dizziness   . Dysphagia   . Dyspnea   . Esophageal stricture    a. s/p dilation x 7; b. 10/14/2017 EGD: benign appearing esophageal stenosis (severe) w/ adherent scar tissue in distal esophagus.  Marland Kitchen GERD (gastroesophageal reflux disease)   . Hypertension   . Osteoarthritis    knees  . Rheumatoid arthritis ([place])   . Thyroid nodule     Review of systems:  Otherwise negative.    Physical Exam  Gen: Alert, oriented. Appears stated age.  HEENT: [place]/AT. PERRLA. Lungs: CTA, no wheezes. CV: RR nl S1, S2. Abd: soft, benign, no masses. BS+ Ext: No edema. Pulses 2+  Planned procedures: Proceed with EGD and possible esophageal dilatation/possible biopsy.  The patient understands the nature of the planned procedure, indications, risks, alternatives and potential complications including but not limited to bleeding, infection, perforation, damage to internal organs and possible oversedation/side effects from anesthesia. The patient agrees and gives consent to proceed.  Please refer to procedure notes for findings, recommendations and patient disposition/instructions.     Lisa Hernandez, M.D. Gastroenterology 08/29/2019  11:10 AM

## 2019-08-29 NOTE — Interval H&P Note (Signed)
History and Physical Interval Note:  08/29/2019 11:11 AM  Lisa Hernandez  has presented today for surgery, with the diagnosis of dysphagia GERD.  The various methods of treatment have been discussed with the patient and family. After consideration of risks, benefits and other options for treatment, the patient has consented to  Procedure(s): ESOPHAGOGASTRODUODENOSCOPY (EGD) WITH PROPOFOL (N/A) as a surgical intervention.  The patient's history has been reviewed, patient examined, no change in status, stable for surgery.  I have reviewed the patient's chart and labs.  Questions were answered to the patient's satisfaction.     [place], [place]

## 2019-08-29 NOTE — Transfer of Care (Signed)
Immediate Anesthesia Transfer of Care Note  Patient: Lisa Hernandez  Procedure(s) Performed: ESOPHAGOGASTRODUODENOSCOPY (EGD) WITH PROPOFOL (N/A )  Patient Location: PACU and Endoscopy Unit  Anesthesia Type:General  Level of Consciousness: drowsy and patient cooperative  Airway & Oxygen Therapy: Patient Spontanous Breathing and Patient connected to nasal cannula oxygen  Post-op Assessment: Report given to RN, Post -op Vital signs reviewed and stable and Patient moving all extremities X 4  Post vital signs: Reviewed and stable  Last Vitals:  Vitals Value Taken Time  BP 171/79 08/29/19 1128  Temp    Pulse 100 08/29/19 1130  Resp 21 08/29/19 1130  SpO2 97 % 08/29/19 1130  Vitals shown include unvalidated device data.  Last Pain:  Vitals:   08/29/19 0940  TempSrc: Tympanic  PainSc: 0-No pain         Complications: No apparent anesthesia complications

## 2019-08-30 ENCOUNTER — Encounter: Payer: Self-pay | Admitting: Internal Medicine

## 2019-09-01 DIAGNOSIS — E119 Type 2 diabetes mellitus without complications: Secondary | ICD-10-CM | POA: Diagnosis not present

## 2019-09-01 LAB — SURGICAL PATHOLOGY

## 2019-09-02 DIAGNOSIS — R1310 Dysphagia, unspecified: Secondary | ICD-10-CM | POA: Diagnosis not present

## 2019-09-02 DIAGNOSIS — Z9889 Other specified postprocedural states: Secondary | ICD-10-CM | POA: Diagnosis not present

## 2019-09-02 DIAGNOSIS — K210 Gastro-esophageal reflux disease with esophagitis: Secondary | ICD-10-CM | POA: Diagnosis not present

## 2019-09-04 ENCOUNTER — Other Ambulatory Visit: Payer: Self-pay | Admitting: Oncology

## 2019-09-04 DIAGNOSIS — D709 Neutropenia, unspecified: Secondary | ICD-10-CM

## 2019-09-04 DIAGNOSIS — D696 Thrombocytopenia, unspecified: Secondary | ICD-10-CM

## 2019-09-08 DIAGNOSIS — E041 Nontoxic single thyroid nodule: Secondary | ICD-10-CM | POA: Diagnosis not present

## 2019-09-08 DIAGNOSIS — K210 Gastro-esophageal reflux disease with esophagitis: Secondary | ICD-10-CM | POA: Diagnosis not present

## 2019-09-08 DIAGNOSIS — E119 Type 2 diabetes mellitus without complications: Secondary | ICD-10-CM | POA: Diagnosis not present

## 2019-09-08 DIAGNOSIS — D61818 Other pancytopenia: Secondary | ICD-10-CM | POA: Diagnosis not present

## 2019-09-08 DIAGNOSIS — Z135 Encounter for screening for eye and ear disorders: Secondary | ICD-10-CM | POA: Diagnosis not present

## 2019-09-09 ENCOUNTER — Other Ambulatory Visit: Payer: Self-pay | Admitting: Nurse Practitioner

## 2019-09-09 ENCOUNTER — Other Ambulatory Visit (HOSPITAL_COMMUNITY): Payer: Self-pay | Admitting: Nurse Practitioner

## 2019-09-09 DIAGNOSIS — R1319 Other dysphagia: Secondary | ICD-10-CM

## 2019-09-09 DIAGNOSIS — R1310 Dysphagia, unspecified: Secondary | ICD-10-CM

## 2019-09-09 DIAGNOSIS — K2100 Gastro-esophageal reflux disease with esophagitis, without bleeding: Secondary | ICD-10-CM

## 2019-09-21 ENCOUNTER — Ambulatory Visit
Admission: RE | Admit: 2019-09-21 | Discharge: 2019-09-21 | Disposition: A | Discharge status: Home / Self Care | Payer: Medicare HMO | Source: Ambulatory Visit | Attending: Nurse Practitioner | Admitting: Nurse Practitioner

## 2019-09-21 ENCOUNTER — Other Ambulatory Visit: Payer: Self-pay

## 2019-09-21 DIAGNOSIS — R1310 Dysphagia, unspecified: Secondary | ICD-10-CM | POA: Diagnosis not present

## 2019-09-21 DIAGNOSIS — R1319 Other dysphagia: Secondary | ICD-10-CM

## 2019-09-21 DIAGNOSIS — K2100 Gastro-esophageal reflux disease with esophagitis, without bleeding: Secondary | ICD-10-CM | POA: Diagnosis not present

## 2019-09-21 DIAGNOSIS — J439 Emphysema, unspecified: Secondary | ICD-10-CM | POA: Diagnosis not present

## 2019-09-21 DIAGNOSIS — I700 Atherosclerosis of aorta: Secondary | ICD-10-CM | POA: Insufficient documentation

## 2019-10-18 DIAGNOSIS — H353131 Nonexudative age-related macular degeneration, bilateral, early dry stage: Secondary | ICD-10-CM | POA: Diagnosis not present

## 2019-10-18 DIAGNOSIS — H25813 Combined forms of age-related cataract, bilateral: Secondary | ICD-10-CM | POA: Diagnosis not present

## 2019-10-18 DIAGNOSIS — E119 Type 2 diabetes mellitus without complications: Secondary | ICD-10-CM | POA: Diagnosis not present

## 2019-11-14 DIAGNOSIS — K2100 Gastro-esophageal reflux disease with esophagitis, without bleeding: Secondary | ICD-10-CM | POA: Diagnosis not present

## 2019-11-14 DIAGNOSIS — I10 Essential (primary) hypertension: Secondary | ICD-10-CM | POA: Diagnosis not present

## 2019-11-14 DIAGNOSIS — Z9889 Other specified postprocedural states: Secondary | ICD-10-CM | POA: Diagnosis not present

## 2019-11-14 DIAGNOSIS — R1310 Dysphagia, unspecified: Secondary | ICD-10-CM | POA: Diagnosis not present

## 2019-11-14 DIAGNOSIS — E119 Type 2 diabetes mellitus without complications: Secondary | ICD-10-CM | POA: Diagnosis not present

## 2019-11-18 ENCOUNTER — Inpatient Hospital Stay: Payer: Medicare HMO | Attending: Oncology

## 2019-11-18 ENCOUNTER — Other Ambulatory Visit: Payer: Self-pay

## 2019-11-18 DIAGNOSIS — E538 Deficiency of other specified B group vitamins: Secondary | ICD-10-CM

## 2019-11-18 DIAGNOSIS — D510 Vitamin B12 deficiency anemia due to intrinsic factor deficiency: Secondary | ICD-10-CM | POA: Insufficient documentation

## 2019-11-18 DIAGNOSIS — D61818 Other pancytopenia: Secondary | ICD-10-CM | POA: Insufficient documentation

## 2019-11-18 MED ORDER — CYANOCOBALAMIN 1000 MCG/ML IJ SOLN
1000.0000 ug | Freq: Once | INTRAMUSCULAR | Status: AC
  Administered 2019-11-18: 11:00:00 1000 ug via INTRAMUSCULAR
  Filled 2019-11-18: qty 1

## 2019-12-06 DIAGNOSIS — M0579 Rheumatoid arthritis with rheumatoid factor of multiple sites without organ or systems involvement: Secondary | ICD-10-CM | POA: Diagnosis not present

## 2019-12-06 DIAGNOSIS — M810 Age-related osteoporosis without current pathological fracture: Secondary | ICD-10-CM | POA: Diagnosis not present

## 2019-12-06 DIAGNOSIS — M8949 Other hypertrophic osteoarthropathy, multiple sites: Secondary | ICD-10-CM | POA: Diagnosis not present

## 2020-01-02 DIAGNOSIS — E119 Type 2 diabetes mellitus without complications: Secondary | ICD-10-CM | POA: Diagnosis not present

## 2020-01-02 DIAGNOSIS — E041 Nontoxic single thyroid nodule: Secondary | ICD-10-CM | POA: Diagnosis not present

## 2020-01-09 DIAGNOSIS — D61818 Other pancytopenia: Secondary | ICD-10-CM | POA: Diagnosis not present

## 2020-01-09 DIAGNOSIS — N182 Chronic kidney disease, stage 2 (mild): Secondary | ICD-10-CM | POA: Diagnosis not present

## 2020-01-09 DIAGNOSIS — R300 Dysuria: Secondary | ICD-10-CM | POA: Diagnosis not present

## 2020-01-09 DIAGNOSIS — Z0000 Encounter for general adult medical examination without abnormal findings: Secondary | ICD-10-CM | POA: Diagnosis not present

## 2020-01-09 DIAGNOSIS — E1122 Type 2 diabetes mellitus with diabetic chronic kidney disease: Secondary | ICD-10-CM | POA: Diagnosis not present

## 2020-02-01 ENCOUNTER — Other Ambulatory Visit
Admission: RE | Admit: 2020-02-01 | Discharge: 2020-02-01 | Disposition: A | Discharge status: Home / Self Care | Payer: Medicare HMO | Source: Ambulatory Visit | Attending: Internal Medicine | Admitting: Internal Medicine

## 2020-02-01 DIAGNOSIS — Z01812 Encounter for preprocedural laboratory examination: Secondary | ICD-10-CM | POA: Diagnosis not present

## 2020-02-01 DIAGNOSIS — Z20822 Contact with and (suspected) exposure to covid-19: Secondary | ICD-10-CM | POA: Insufficient documentation

## 2020-02-01 LAB — SARS CORONAVIRUS 2 (TAT 6-24 HRS): SARS Coronavirus 2: NEGATIVE

## 2020-02-06 ENCOUNTER — Encounter: Payer: Self-pay | Admitting: Internal Medicine

## 2020-02-06 ENCOUNTER — Other Ambulatory Visit: Payer: Self-pay

## 2020-02-06 ENCOUNTER — Ambulatory Visit: Payer: Medicare HMO | Admitting: Anesthesiology

## 2020-02-06 ENCOUNTER — Ambulatory Visit
Admission: RE | Admit: 2020-02-06 | Discharge: 2020-02-06 | Disposition: A | Discharge status: Home / Self Care | Payer: Medicare HMO | Attending: Internal Medicine | Admitting: Internal Medicine

## 2020-02-06 ENCOUNTER — Encounter
Admission: RE | Disposition: A | Discharge status: Home / Self Care | Payer: Self-pay | Source: Home / Self Care | Attending: Internal Medicine

## 2020-02-06 DIAGNOSIS — K219 Gastro-esophageal reflux disease without esophagitis: Secondary | ICD-10-CM | POA: Diagnosis not present

## 2020-02-06 DIAGNOSIS — I129 Hypertensive chronic kidney disease with stage 1 through stage 4 chronic kidney disease, or unspecified chronic kidney disease: Secondary | ICD-10-CM | POA: Insufficient documentation

## 2020-02-06 DIAGNOSIS — R1314 Dysphagia, pharyngoesophageal phase: Secondary | ICD-10-CM | POA: Diagnosis not present

## 2020-02-06 DIAGNOSIS — N189 Chronic kidney disease, unspecified: Secondary | ICD-10-CM | POA: Insufficient documentation

## 2020-02-06 DIAGNOSIS — M170 Bilateral primary osteoarthritis of knee: Secondary | ICD-10-CM | POA: Insufficient documentation

## 2020-02-06 DIAGNOSIS — E1122 Type 2 diabetes mellitus with diabetic chronic kidney disease: Secondary | ICD-10-CM | POA: Diagnosis not present

## 2020-02-06 DIAGNOSIS — Z881 Allergy status to other antibiotic agents status: Secondary | ICD-10-CM | POA: Diagnosis not present

## 2020-02-06 DIAGNOSIS — R1310 Dysphagia, unspecified: Secondary | ICD-10-CM | POA: Diagnosis not present

## 2020-02-06 DIAGNOSIS — Z7984 Long term (current) use of oral hypoglycemic drugs: Secondary | ICD-10-CM | POA: Insufficient documentation

## 2020-02-06 DIAGNOSIS — M069 Rheumatoid arthritis, unspecified: Secondary | ICD-10-CM | POA: Insufficient documentation

## 2020-02-06 DIAGNOSIS — K222 Esophageal obstruction: Secondary | ICD-10-CM | POA: Insufficient documentation

## 2020-02-06 DIAGNOSIS — Z79899 Other long term (current) drug therapy: Secondary | ICD-10-CM | POA: Insufficient documentation

## 2020-02-06 HISTORY — PX: ESOPHAGOGASTRODUODENOSCOPY (EGD) WITH PROPOFOL: SHX5813

## 2020-02-06 LAB — GLUCOSE, CAPILLARY: Glucose-Capillary: 85 mg/dL (ref 70–99)

## 2020-02-06 SURGERY — ESOPHAGOGASTRODUODENOSCOPY (EGD) WITH PROPOFOL
Anesthesia: General

## 2020-02-06 MED ORDER — SODIUM CHLORIDE 0.9 % IV SOLN: INTRAVENOUS | Status: DC

## 2020-02-06 MED ORDER — PROPOFOL 500 MG/50ML IV EMUL
INTRAVENOUS | Status: DC | PRN
  Administered 2020-02-06: 60 ug/kg/min via INTRAVENOUS

## 2020-02-06 MED ORDER — LIDOCAINE HCL (PF) 2 % IJ SOLN
INTRAMUSCULAR | Status: DC | PRN
  Administered 2020-02-06: 60 mg via INTRADERMAL

## 2020-02-06 MED ORDER — PROPOFOL 10 MG/ML IV BOLUS
INTRAVENOUS | Status: DC | PRN
  Administered 2020-02-06: 10 mg via INTRAVENOUS
  Administered 2020-02-06: 40 mg via INTRAVENOUS

## 2020-02-06 NOTE — Interval H&P Note (Signed)
History and Physical Interval Note:  02/06/2020 8:48 AM  Lisa Hernandez  has presented today for surgery, with the diagnosis of DYSPHAGIA.  The various methods of treatment have been discussed with the patient and family. After consideration of risks, benefits and other options for treatment, the patient has consented to  Procedure(s): ESOPHAGOGASTRODUODENOSCOPY (EGD) WITH PROPOFOL (N/A) as a surgical intervention.  The patient's history has been reviewed, patient examined, no change in status, stable for surgery.  I have reviewed the patient's chart and labs.  Questions were answered to the patient's satisfaction.     [place], [place]

## 2020-02-06 NOTE — Anesthesia Postprocedure Evaluation (Signed)
Anesthesia Post Note  Patient: Lisa Hernandez  Procedure(s) Performed: ESOPHAGOGASTRODUODENOSCOPY (EGD) WITH PROPOFOL (N/A )  Patient location during evaluation: Endoscopy Anesthesia Type: General Level of consciousness: awake and alert Pain management: pain level controlled Vital Signs Assessment: post-procedure vital signs reviewed and stable Respiratory status: spontaneous breathing, nonlabored ventilation, respiratory function stable and patient connected to nasal cannula oxygen Cardiovascular status: blood pressure returned to baseline and stable Postop Assessment: no apparent nausea or vomiting Anesthetic complications: no     Last Vitals:  Vitals:   02/06/20 0920 02/06/20 0930  BP: (!) 177/68 (!) 188/84  Pulse: (!) 59 80  Resp: 15 19  Temp:    SpO2: 100% 100%    Last Pain:  Vitals:   02/06/20 0900  TempSrc: Temporal  PainSc:                  Martha Clan

## 2020-02-06 NOTE — Transfer of Care (Signed)
Immediate Anesthesia Transfer of Care Note  Patient: Lisa Hernandez  Procedure(s) Performed: ESOPHAGOGASTRODUODENOSCOPY (EGD) WITH PROPOFOL (N/A )  Patient Location: PACU  Anesthesia Type:General  Level of Consciousness: sedated  Airway & Oxygen Therapy: Patient Spontanous Breathing and Patient connected to nasal cannula oxygen  Post-op Assessment: Report given to RN and Post -op Vital signs reviewed and stable  Post vital signs: Reviewed and stable  Last Vitals:  Vitals Value Taken Time  BP    Temp    Pulse 104 02/06/20 0903  Resp 22 02/06/20 0903  SpO2 99 % 02/06/20 0903  Vitals shown include unvalidated device data.  Last Pain:  Vitals:   02/06/20 0749  TempSrc: Temporal  PainSc: 0-No pain         Complications: No apparent anesthesia complications

## 2020-02-06 NOTE — Op Note (Signed)
[facility] Patient Name: Lisa Hernandez Procedure Date: 02/06/2020 8:42 AM MRN: [id number] Account #: [id number] Date of Birth: 1936/09/22 Admit Type: Outpatient Age: 84 Room: [facility] ENDO ROOM 3 Gender: Female Note Status: Finalized Procedure:             Upper GI endoscopy Indications:           Esophageal dysphagia, Suspected esophageal reflux Providers:             Benay Pike. Karin Pinedo MD, MD Medicines:             Propofol per Anesthesia Complications:         No immediate complications. Estimated blood loss:                         Minimal. Procedure:             Pre-Anesthesia Assessment:                        - The risks and benefits of the procedure and the                         sedation options and risks were discussed with the                         patient. All questions were answered and informed                         consent was obtained.                        - Patient identification and proposed procedure were                         verified prior to the procedure by the nurse. The                         procedure was verified in the procedure room.                        - ASA Grade Assessment: III - A patient with severe                         systemic disease.                        - After reviewing the risks and benefits, the patient                         was deemed in satisfactory condition to undergo the                         procedure.                        After obtaining informed consent, the endoscope was                         passed under direct vision. Throughout the procedure,  the patient's blood pressure, pulse, and oxygen                         saturations were monitored continuously. The Endoscope                         was introduced through the mouth, and advanced to the                         third part of duodenum. The upper GI endoscopy was   accomplished without difficulty. The upper GI                         endoscopy was technically difficult and complex due to                         stricture. Successful completion of the procedure was                         aided by performing the maneuvers documented (below)                         in this report. The patient tolerated the procedure                         well. Findings:      One benign-appearing, intrinsic severe stenosis was found in the distal       esophagus. This stenosis measured 1 cm (inner diameter) x less than one       cm (in length). The stenosis was traversed after dilation. A TTS dilator       was passed through the scope. Dilation with a 09-25-11 mm balloon       dilator was performed to 12 mm. The dilation site was examined following       endoscope reinsertion and showed moderate improvement in luminal       narrowing. Estimated blood loss was minimal.      The stomach was normal.      The examined duodenum was normal.      The cardia and gastric fundus were normal on retroflexion.      The exam was otherwise without abnormality. Impression:            - Benign-appearing esophageal stenosis. Dilated.                        - Normal stomach.                        - Normal examined duodenum.                        - The examination was otherwise normal.                        - No specimens collected. Recommendation:        - Patient has a contact number available for                         emergencies. The signs and symptoms of potential  delayed complications were discussed with the patient.                         Return to normal activities tomorrow. Written                         discharge instructions were provided to the patient.                        - Resume previous diet.                        - Continue present medications.                        - Repeat upper endoscopy in 1 month for retreatment.                         - Return to GI office PRN.                        - The findings and recommendations were discussed with                         the patient. Procedure Code(s):     --- Professional ---                        [phone], Esophagogastroduodenoscopy, flexible,                         transoral; with transendoscopic balloon dilation of                         esophagus (less than 30 mm diameter) Diagnosis Code(s):     --- Professional ---                        R13.14, Dysphagia, pharyngoesophageal phase                        K22.2, Esophageal obstruction CPT copyright 2019 American Medical Association. All rights reserved. The codes documented in this report are preliminary and upon coder review may  be revised to meet current compliance requirements. Efrain Sella MD, MD 02/06/2020 9:04:27 AM This report has been signed electronically. Number of Addenda: 0 Note Initiated On: 02/06/2020 8:42 AM Estimated Blood Loss:  Estimated blood loss was minimal.      [facility]

## 2020-02-06 NOTE — Anesthesia Preprocedure Evaluation (Signed)
Anesthesia Evaluation  Patient identified by MRN, date of birth, ID band Patient awake    Reviewed: Allergy & Precautions, NPO status , Patient's Chart, lab work & pertinent test results, reviewed documented beta blocker date and time   History of Anesthesia Complications Negative for: history of anesthetic complications  Airway Mallampati: II  TM Distance: >3 FB     Dental  (+) Upper Dentures, Lower Dentures, Dental Advidsory Given   Pulmonary neg shortness of breath, pneumonia, resolved, neg recent URI,           Cardiovascular Exercise Tolerance: Good hypertension, Pt. on medications (-) angina(-) Past MI (-) dysrhythmias      Neuro/Psych negative neurological ROS  negative psych ROS   GI/Hepatic Neg liver ROS, GERD  ,  Endo/Other  diabetes, Type 2  Renal/GU negative Renal ROS  negative genitourinary   Musculoskeletal  (+) Arthritis ,   Abdominal   Peds  Hematology  (+) Blood dyscrasia, anemia ,   Anesthesia Other Findings Past Medical History: No date: Diabetes mellitus without complication (HCC) No date: Dysphagia No date: Esophageal stricture     Comment:  a. s/p dilation x 7; b. 10/14/2017 EGD: benign appearing              esophageal stenosis (severe) w/ adherent scar tissue in               distal esophagus. No date: GERD (gastroesophageal reflux disease) No date: Hypertension No date: Osteoarthritis     Comment:  knees No date: Rheumatoid arthritis (HCC) No date: Thyroid nodule   Reproductive/Obstetrics negative OB ROS                             Anesthesia Physical  Anesthesia Plan  ASA: III  Anesthesia Plan: General   Post-op Pain Management:    Induction: Intravenous  PONV Risk Score and Plan: 3 and Propofol infusion and TIVA  Airway Management Planned: Nasal Cannula and Natural Airway  Additional Equipment:   Intra-op Plan:   Post-operative Plan:    Informed Consent: I have reviewed the patients History and Physical, chart, labs and discussed the procedure including the risks, benefits and alternatives for the proposed anesthesia with the patient or authorized representative who has indicated his/her understanding and acceptance.       Plan Discussed with: CRNA  Anesthesia Plan Comments:         Anesthesia Quick Evaluation

## 2020-02-06 NOTE — Anesthesia Procedure Notes (Signed)
Date/Time: 02/06/2020 8:56 AM Performed by: Nelda Marseille, CRNA Pre-anesthesia Checklist: Patient identified, Emergency Drugs available, Suction available, Patient being monitored and Timeout performed Oxygen Delivery Method: Nasal cannula

## 2020-02-06 NOTE — H&P (Signed)
Outpatient short stay form Pre-procedure 02/06/2020 8:45 AM Pascha Fogal K. Alice Reichert, M.D.  Primary Physician: [name], M.D.  Reason for visit:  Dysphagia  History of present illness:  Patient is 84 y/o female with recurrent dyphagia secondary to severe, occlusive, benign esophageal stricture. She had short term, minimal improvement in dysphagia after 4 quadrant biopsies taken during 08/29/2019 EGD to debulk stricture in effort to decrease risk of complications in this 84 y/o female. Patient is unhappy with overall results and requests esophageal dilation be performed.     Current Facility-Administered Medications:  .  0.9 %  sodium chloride infusion, , Intravenous, Continuous, [place], Benay Pike, MD, Last Rate: 20 mL/hr at 02/06/20 0810, New Bag at 02/06/20 0810  Medications Prior to Admission  Medication Sig Dispense Refill Last Dose  . folic acid (FOLVITE) 1 MG tablet Take 1 tablet (1 mg total) by mouth daily. 90 tablet 1 02/05/2020 at Unknown time  . furosemide (LASIX) 20 MG tablet Take 20 mg by mouth daily.    02/05/2020 at Unknown time  . gabapentin (NEURONTIN) 100 MG capsule Take 100 mg by mouth 3 (three) times daily.    02/05/2020 at Unknown time  . hydroxychloroquine (PLAQUENIL) 200 MG tablet Take 200 mg by mouth daily.  1 02/05/2020 at Unknown time  . lisinopril (ZESTRIL) 20 MG tablet Take 20 mg by mouth daily.   02/06/2020 at Unknown time  . metFORMIN (GLUCOPHAGE) 500 MG tablet Take 500 mg by mouth daily.    02/05/2020 at Unknown time  . ONETOUCH DELICA LANCETS [id number] MISC 3 daily   02/06/2020 at Unknown time  . pantoprazole (PROTONIX) 40 MG tablet Take 40 mg by mouth 2 (two) times daily.   02/05/2020 at Unknown time  . traZODone (DESYREL) 50 MG tablet Take 50 mg by mouth at bedtime.   3 02/05/2020 at Unknown time  . atenolol (TENORMIN) 25 MG tablet Take 1 tablet (25 mg total) by mouth daily. 30 tablet 33   . meclizine (ANTIVERT) 12.5 MG tablet Take 12.5 mg by mouth 3 (three) times daily as  needed for dizziness.     . potassium chloride (K-DUR) 10 MEQ tablet TAKE 1 TABLET (10 MEQ TOTAL) BY MOUTH ONCE DAILY  11   . traMADol (ULTRAM) 50 MG tablet Take 1 tablet (50 mg total) by mouth every 6 (six) hours as needed for up to 30 doses. 20 tablet 0      Allergies  Allergen Reactions  . Erythromycin Ethylsuccinate [Erythromycin] Other (See Comments)    GI upset     Past Medical History:  Diagnosis Date  . Back pain   . CRD (chronic renal disease)   . Diabetes mellitus without complication ([place])   . Dizziness   . Dysphagia   . Dyspnea   . Esophageal stricture    a. s/p dilation x 7; b. 10/14/2017 EGD: benign appearing esophageal stenosis (severe) w/ adherent scar tissue in distal esophagus.  Marland Kitchen GERD (gastroesophageal reflux disease)   . Hypertension   . Osteoarthritis    knees  . Rheumatoid arthritis ([place])   . Thyroid nodule     Review of systems:  Otherwise negative.    Physical Exam  Gen: Alert, oriented. Appears stated age.  HEENT: [place]/AT. PERRLA. Lungs: CTA, no wheezes. CV: RR nl S1, S2. Abd: soft, benign, no masses. BS+ Ext: No edema. Pulses 2+    Planned procedures: Proceed with EGD with balloon dilation. The patient understands the nature of the planned procedure, indications, risks, alternatives  and potential complications including but not limited to bleeding, infection, perforation, damage to internal organs and possible oversedation/side effects from anesthesia. The patient agrees and gives consent to proceed.  Please refer to procedure notes for findings, recommendations and patient disposition/instructions.     Yousaf Sainato K. Alice Reichert, M.D. Gastroenterology 02/06/2020  8:45 AM

## 2020-02-07 ENCOUNTER — Encounter: Payer: Self-pay | Admitting: *Deleted

## 2020-02-15 ENCOUNTER — Inpatient Hospital Stay: Payer: Medicare HMO

## 2020-02-17 ENCOUNTER — Ambulatory Visit: Payer: Medicare HMO

## 2020-02-17 ENCOUNTER — Ambulatory Visit: Payer: Medicare HMO | Admitting: Oncology

## 2020-02-20 DIAGNOSIS — M810 Age-related osteoporosis without current pathological fracture: Secondary | ICD-10-CM | POA: Diagnosis not present

## 2020-03-05 ENCOUNTER — Other Ambulatory Visit: Payer: Self-pay | Admitting: Oncology

## 2020-03-05 DIAGNOSIS — D696 Thrombocytopenia, unspecified: Secondary | ICD-10-CM

## 2020-03-05 DIAGNOSIS — D709 Neutropenia, unspecified: Secondary | ICD-10-CM

## 2020-03-05 NOTE — Telephone Encounter (Signed)
I have not seen her in a year. Not refilling. Thanks. If she wants to follow up that's ok. Thanks.

## 2020-03-14 DIAGNOSIS — Z8719 Personal history of other diseases of the digestive system: Secondary | ICD-10-CM | POA: Diagnosis not present

## 2020-03-14 DIAGNOSIS — K219 Gastro-esophageal reflux disease without esophagitis: Secondary | ICD-10-CM | POA: Diagnosis not present

## 2020-03-14 DIAGNOSIS — K2970 Gastritis, unspecified, without bleeding: Secondary | ICD-10-CM | POA: Diagnosis not present

## 2020-03-14 DIAGNOSIS — R1310 Dysphagia, unspecified: Secondary | ICD-10-CM | POA: Diagnosis not present

## 2020-03-20 ENCOUNTER — Other Ambulatory Visit
Admission: RE | Admit: 2020-03-20 | Discharge: 2020-03-20 | Disposition: A | Discharge status: Home / Self Care | Payer: Medicare HMO | Source: Ambulatory Visit | Attending: Internal Medicine | Admitting: Internal Medicine

## 2020-03-20 DIAGNOSIS — Z01812 Encounter for preprocedural laboratory examination: Secondary | ICD-10-CM | POA: Diagnosis not present

## 2020-03-20 DIAGNOSIS — Z20822 Contact with and (suspected) exposure to covid-19: Secondary | ICD-10-CM | POA: Insufficient documentation

## 2020-03-20 LAB — SARS CORONAVIRUS 2 (TAT 6-24 HRS): SARS Coronavirus 2: NEGATIVE

## 2020-03-21 ENCOUNTER — Encounter: Payer: Self-pay | Admitting: Internal Medicine

## 2020-03-22 ENCOUNTER — Ambulatory Visit
Admission: RE | Admit: 2020-03-22 | Discharge: 2020-03-22 | Disposition: A | Discharge status: Home / Self Care | Payer: Medicare HMO | Attending: Internal Medicine | Admitting: Internal Medicine

## 2020-03-22 ENCOUNTER — Ambulatory Visit: Payer: Medicare HMO | Admitting: Anesthesiology

## 2020-03-22 ENCOUNTER — Other Ambulatory Visit: Payer: Self-pay

## 2020-03-22 ENCOUNTER — Encounter
Admission: RE | Disposition: A | Discharge status: Home / Self Care | Payer: Self-pay | Source: Home / Self Care | Attending: Internal Medicine

## 2020-03-22 ENCOUNTER — Encounter: Payer: Self-pay | Admitting: Internal Medicine

## 2020-03-22 DIAGNOSIS — K208 Other esophagitis without bleeding: Secondary | ICD-10-CM | POA: Diagnosis not present

## 2020-03-22 DIAGNOSIS — N189 Chronic kidney disease, unspecified: Secondary | ICD-10-CM | POA: Diagnosis not present

## 2020-03-22 DIAGNOSIS — I129 Hypertensive chronic kidney disease with stage 1 through stage 4 chronic kidney disease, or unspecified chronic kidney disease: Secondary | ICD-10-CM | POA: Diagnosis not present

## 2020-03-22 DIAGNOSIS — K219 Gastro-esophageal reflux disease without esophagitis: Secondary | ICD-10-CM | POA: Insufficient documentation

## 2020-03-22 DIAGNOSIS — M170 Bilateral primary osteoarthritis of knee: Secondary | ICD-10-CM | POA: Diagnosis not present

## 2020-03-22 DIAGNOSIS — K222 Esophageal obstruction: Secondary | ICD-10-CM | POA: Diagnosis not present

## 2020-03-22 DIAGNOSIS — K2970 Gastritis, unspecified, without bleeding: Secondary | ICD-10-CM | POA: Diagnosis not present

## 2020-03-22 DIAGNOSIS — K3189 Other diseases of stomach and duodenum: Secondary | ICD-10-CM | POA: Diagnosis not present

## 2020-03-22 DIAGNOSIS — Z7984 Long term (current) use of oral hypoglycemic drugs: Secondary | ICD-10-CM | POA: Diagnosis not present

## 2020-03-22 DIAGNOSIS — Z79899 Other long term (current) drug therapy: Secondary | ICD-10-CM | POA: Diagnosis not present

## 2020-03-22 DIAGNOSIS — E1122 Type 2 diabetes mellitus with diabetic chronic kidney disease: Secondary | ICD-10-CM | POA: Diagnosis not present

## 2020-03-22 DIAGNOSIS — R1314 Dysphagia, pharyngoesophageal phase: Secondary | ICD-10-CM | POA: Diagnosis not present

## 2020-03-22 DIAGNOSIS — M069 Rheumatoid arthritis, unspecified: Secondary | ICD-10-CM | POA: Diagnosis not present

## 2020-03-22 DIAGNOSIS — K2940 Chronic atrophic gastritis without bleeding: Secondary | ICD-10-CM | POA: Diagnosis not present

## 2020-03-22 DIAGNOSIS — Z881 Allergy status to other antibiotic agents status: Secondary | ICD-10-CM | POA: Diagnosis not present

## 2020-03-22 DIAGNOSIS — K2960 Other gastritis without bleeding: Secondary | ICD-10-CM | POA: Diagnosis not present

## 2020-03-22 DIAGNOSIS — K209 Esophagitis, unspecified without bleeding: Secondary | ICD-10-CM | POA: Diagnosis not present

## 2020-03-22 DIAGNOSIS — Z8719 Personal history of other diseases of the digestive system: Secondary | ICD-10-CM | POA: Diagnosis not present

## 2020-03-22 DIAGNOSIS — Z86010 Personal history of colonic polyps: Secondary | ICD-10-CM | POA: Diagnosis not present

## 2020-03-22 HISTORY — PX: ESOPHAGOGASTRODUODENOSCOPY (EGD) WITH PROPOFOL: SHX5813

## 2020-03-22 HISTORY — DX: Personal history of adenomatous and serrated colon polyps: Z86.0101

## 2020-03-22 HISTORY — DX: Personal history of colonic polyps: Z86.010

## 2020-03-22 LAB — KOH PREP
KOH Prep: NONE SEEN
Special Requests: NORMAL

## 2020-03-22 LAB — GLUCOSE, CAPILLARY: Glucose-Capillary: 97 mg/dL (ref 70–99)

## 2020-03-22 SURGERY — ESOPHAGOGASTRODUODENOSCOPY (EGD) WITH PROPOFOL
Anesthesia: General

## 2020-03-22 MED ORDER — PROPOFOL 500 MG/50ML IV EMUL
INTRAVENOUS | Status: DC | PRN
  Administered 2020-03-22: 150 ug/kg/min via INTRAVENOUS

## 2020-03-22 MED ORDER — LABETALOL HCL 5 MG/ML IV SOLN
INTRAVENOUS | Status: AC
  Filled 2020-03-22: qty 4

## 2020-03-22 MED ORDER — SODIUM CHLORIDE 0.9 % IV SOLN
INTRAVENOUS | Status: DC
  Administered 2020-03-22: 1000 mL via INTRAVENOUS

## 2020-03-22 MED ORDER — LABETALOL HCL 5 MG/ML IV SOLN
INTRAVENOUS | Status: DC | PRN
  Administered 2020-03-22: 10 mg via INTRAVENOUS

## 2020-03-22 MED ORDER — PROPOFOL 10 MG/ML IV BOLUS
INTRAVENOUS | Status: DC | PRN
  Administered 2020-03-22: 50 mg via INTRAVENOUS

## 2020-03-22 MED ORDER — PROPOFOL 500 MG/50ML IV EMUL
INTRAVENOUS | Status: AC
  Filled 2020-03-22: qty 50

## 2020-03-22 MED ORDER — LIDOCAINE HCL (CARDIAC) PF 100 MG/5ML IV SOSY
PREFILLED_SYRINGE | INTRAVENOUS | Status: DC | PRN
  Administered 2020-03-22: 50 mg via INTRAVENOUS

## 2020-03-22 NOTE — Brief Op Note (Signed)
Esophageal brushing for KOH sent to lab  

## 2020-03-22 NOTE — H&P (Signed)
Outpatient short stay form Pre-procedure 03/22/2020 8:20 AM Lisa Hernandez K. Alice Reichert, M.D.  Primary Physician: [name], M.D.  Reason for visit:  Dysphagia, follow up of esophageal stricture.  History of present illness:  84 y/o patient presents for followup of esophageal stricture s/p dilaton to 63mm on 02/06/20. Dysphagia persists.     Current Facility-Administered Medications:  .  0.9 %  sodium chloride infusion, , Intravenous, Continuous, [place], Benay Pike, MD, Last Rate: 20 mL/hr at 03/22/20 0819, 1,000 mL at 03/22/20 0819  Medications Prior to Admission  Medication Sig Dispense Refill Last Dose  . atenolol (TENORMIN) 25 MG tablet Take 1 tablet (25 mg total) by mouth daily. 30 tablet 33 03/22/2020 at Unknown time  . lisinopril (ZESTRIL) 20 MG tablet Take 20 mg by mouth daily.   03/22/2020 at Unknown time  . folic acid (FOLVITE) 1 MG tablet Take 1 tablet (1 mg total) by mouth daily. 90 tablet 1   . furosemide (LASIX) 20 MG tablet Take 20 mg by mouth daily.      Marland Kitchen gabapentin (NEURONTIN) 100 MG capsule Take 100 mg by mouth 3 (three) times daily.      . hydroxychloroquine (PLAQUENIL) 200 MG tablet Take 200 mg by mouth daily.  1   . meclizine (ANTIVERT) 12.5 MG tablet Take 12.5 mg by mouth 3 (three) times daily as needed for dizziness.     . metFORMIN (GLUCOPHAGE) 500 MG tablet Take 500 mg by mouth daily.      Glory Rosebush DELICA LANCETS [id number] MISC 3 daily     . pantoprazole (PROTONIX) 40 MG tablet Take 40 mg by mouth 2 (two) times daily.     . potassium chloride (K-DUR) 10 MEQ tablet TAKE 1 TABLET (10 MEQ TOTAL) BY MOUTH ONCE DAILY  11   . traMADol (ULTRAM) 50 MG tablet Take 1 tablet (50 mg total) by mouth every 6 (six) hours as needed for up to 30 doses. 20 tablet 0   . traZODone (DESYREL) 50 MG tablet Take 50 mg by mouth at bedtime.   3      Allergies  Allergen Reactions  . Erythromycin Ethylsuccinate [Erythromycin] Other (See Comments)    GI upset     Past Medical History:   Diagnosis Date  . Back pain   . CRD (chronic renal disease)   . Diabetes mellitus without complication ([place])   . Dizziness   . Dysphagia   . Dyspnea   . Esophageal stricture    a. s/p dilation x 7; b. 10/14/2017 EGD: benign appearing esophageal stenosis (severe) w/ adherent scar tissue in distal esophagus.  Marland Kitchen GERD (gastroesophageal reflux disease)   . Hx of adenomatous colonic polyps   . Hypertension   . Osteoarthritis    knees  . Rheumatoid arthritis ([place])   . Thyroid nodule     Review of systems:  Otherwise negative.    Physical Exam  Gen: Alert, oriented. Appears stated age.  HEENT: [place]/AT. PERRLA. Lungs: CTA, no wheezes. CV: RR nl S1, S2. Abd: soft, benign, no masses. BS+ Ext: No edema. Pulses 2+    Planned procedures: Proceed with repeat therapeutic EGD with probable esophageal dilation. The patient understands the nature of the planned procedure, indications, risks, alternatives and potential complications including but not limited to bleeding, infection, perforation, damage to internal organs and possible oversedation/side effects from anesthesia. The patient agrees and gives consent to proceed.  Please refer to procedure notes for findings, recommendations and patient disposition/instructions.     [employer]  K. Alice Reichert, M.D. Gastroenterology 03/22/2020  8:20 AM

## 2020-03-22 NOTE — Anesthesia Procedure Notes (Signed)
Date/Time: 03/22/2020 8:57 AM Performed by: Johnna Acosta, CRNA Pre-anesthesia Checklist: Patient identified, Emergency Drugs available, Suction available, Timeout performed and Patient being monitored Patient Re-evaluated:Patient Re-evaluated prior to induction Oxygen Delivery Method: Nasal cannula Preoxygenation: Pre-oxygenation with 100% oxygen Induction Type: IV induction

## 2020-03-22 NOTE — Transfer of Care (Signed)
Immediate Anesthesia Transfer of Care Note  Patient: Lisa Hernandez  Procedure(s) Performed: ESOPHAGOGASTRODUODENOSCOPY (EGD) WITH PROPOFOL (N/A )  Patient Location: PACU  Anesthesia Type:General  Level of Consciousness: awake and drowsy  Airway & Oxygen Therapy: Patient Spontanous Breathing and Patient connected to nasal cannula oxygen  Post-op Assessment: Report given to RN and Post -op Vital signs reviewed and stable  Post vital signs: Reviewed and stable  Last Vitals:  Vitals Value Taken Time  BP 150/73 03/22/20 0917  Temp 36.1 C 03/22/20 0916  Pulse 75 03/22/20 0917  Resp 21 03/22/20 0917  SpO2 93 % 03/22/20 0917  Vitals shown include unvalidated device data.  Last Pain:  Vitals:   03/22/20 0916  TempSrc: Tympanic  PainSc:          Complications: No apparent anesthesia complications

## 2020-03-22 NOTE — Interval H&P Note (Signed)
History and Physical Interval Note:  03/22/2020 8:22 AM  Lisa Hernandez  has presented today for surgery, with the diagnosis of HX ESOPH STRICTURE.  The various methods of treatment have been discussed with the patient and family. After consideration of risks, benefits and other options for treatment, the patient has consented to  Procedure(s): ESOPHAGOGASTRODUODENOSCOPY (EGD) WITH PROPOFOL (N/A) as a surgical intervention.  The patient's history has been reviewed, patient examined, no change in status, stable for surgery.  I have reviewed the patient's chart and labs.  Questions were answered to the patient's satisfaction.     [place], [place]

## 2020-03-22 NOTE — Anesthesia Postprocedure Evaluation (Addendum)
Anesthesia Post Note  Patient: Lisa Hernandez  Procedure(s) Performed: ESOPHAGOGASTRODUODENOSCOPY (EGD) WITH PROPOFOL (N/A )  Patient location during evaluation: PACU Anesthesia Type: General Level of consciousness: awake and alert Pain management: pain level controlled Vital Signs Assessment: post-procedure vital signs reviewed and stable Respiratory status: spontaneous breathing, nonlabored ventilation and respiratory function stable Cardiovascular status: blood pressure returned to baseline and stable Postop Assessment: no apparent nausea or vomiting Anesthetic complications: no     Last Vitals:  Vitals:   03/22/20 0936 03/22/20 0946  BP: (!) 212/90 (!) 214/90  Pulse: 76   Resp: 19   Temp:    SpO2: 100%     Last Pain:  Vitals:   03/22/20 0936  TempSrc:   PainSc: 0-No pain    I was not notified of this patient's persistent hypertension prior to her being discharged from postop.  This issue has been addressed with endo staff.  Pt denied any symptoms per nursing staff.  I called pt to follow up, she did not answer her phone.  Called pt's sister who said she just dropped pt off at her house and pt was feeling fine when she dropped her off.  The plan was for pt to take her antihypertensives when she got home.  [place]

## 2020-03-22 NOTE — Op Note (Signed)
[facility] Gastroenterology Patient Name: Lisa Hernandez Procedure Date: 03/22/2020 8:51 AM MRN: [id number] Account #: [id number] Date of Birth: 1936/02/02 Admit Type: Outpatient Age: 84 Room: [facility] ENDO ROOM 4 Gender: Female Note Status: Finalized Procedure:             Upper GI endoscopy Indications:           Esophageal dysphagia, For therapy of esophageal                         stricture Providers:             Benay Pike. Alice Reichert MD, MD Referring MD:          [name] (Referring MD) Medicines:             Propofol per Anesthesia Complications:         No immediate complications. Estimated blood loss:                         Minimal. Procedure:             Pre-Anesthesia Assessment:                        - The risks and benefits of the procedure and the                         sedation options and risks were discussed with the                         patient. All questions were answered and informed                         consent was obtained.                        - Patient identification and proposed procedure were                         verified prior to the procedure by the nurse. The                         procedure was verified in the procedure room.                        - ASA Grade Assessment: III - A patient with severe                         systemic disease.                        - After reviewing the risks and benefits, the patient                         was deemed in satisfactory condition to undergo the                         procedure.                        After obtaining informed consent, the endoscope was  passed under direct vision. Throughout the procedure,                         the patient's blood pressure, pulse, and oxygen                         saturations were monitored continuously. The Endoscope                         was introduced through the mouth, and advanced to the   third part of duodenum. The upper GI endoscopy was                         technically difficult and complex due to stricture.                         Successful completion of the procedure was aided by                         performing the maneuvers documented (below) in this                         report. The patient tolerated the procedure well. Findings:      One severe (stenosis; an endoscope cannot pass) stenosis was found at       the gastroesophageal junction. This stenosis measured 9 mm (inner       diameter) x 1 cm (in length). The stenosis was after biopsy and       esophageal balloon dilation. A TTS dilator was passed through the scope.       Dilation with a 12-13.5-15 mm balloon dilator was performed to 13.5 mm.       The dilation site was examined following endoscope reinsertion and       showed complete resolution of luminal narrowing. Estimated blood loss       was minimal. Biopsies were taken with a cold forceps for histology.      Localized mild inflammation characterized by erythema was found in the       gastric antrum.      Diffuse atrophic mucosa was found in the cardia and in the gastric       fundus.      The examined duodenum was normal.      Localized, white plaques were found in the distal esophagus. Cells for       cytology were obtained by brushing.      The exam was otherwise without abnormality. Impression:            - Esophageal stenosis. Dilated. Biopsied.                        - Gastritis.                        - Gastric mucosal atrophy.                        - Normal examined duodenum.                        - Esophageal plaques were found, suspicious for  candidiasis. Cells for cytology obtained.                        - The examination was otherwise normal. Recommendation:        - Patient has a contact number available for                         emergencies. The signs and symptoms of potential                          delayed complications were discussed with the patient.                         Return to normal activities tomorrow. Written                         discharge instructions were provided to the patient.                        - Resume previous diet.                        - Continue present medications.                        - Await pathology results.                        - Return to physician assistant in 2 weeks.                        - The findings and recommendations were discussed with                         the patient. Procedure Code(s):     --- Professional ---                        [id number], Esophagogastroduodenoscopy, flexible,                         transoral; with transendoscopic balloon dilation of                         esophagus (less than 30 mm diameter)                        43239, 59, Esophagogastroduodenoscopy, flexible,                         transoral; with biopsy, single or multiple Diagnosis Code(s):     --- Professional ---                        R13.14, Dysphagia, pharyngoesophageal phase                        K22.9, Disease of esophagus, unspecified                        K31.89, Other diseases of stomach and duodenum  K29.70, Gastritis, unspecified, without bleeding                        K22.2, Esophageal obstruction CPT copyright 2019 American Medical Association. All rights reserved. The codes documented in this report are preliminary and upon coder review may  be revised to meet current compliance requirements. Efrain Sella MD, MD 03/22/2020 9:16:28 AM This report has been signed electronically. Number of Addenda: 0 Note Initiated On: 03/22/2020 8:51 AM Estimated Blood Loss:  Estimated blood loss was minimal.      [facility]

## 2020-03-22 NOTE — Anesthesia Preprocedure Evaluation (Addendum)
Anesthesia Evaluation  Patient identified by MRN, date of birth, ID band Patient awake    Reviewed: Allergy & Precautions, H&P , NPO status , Patient's Chart, lab work & pertinent test results  Airway Mallampati: II  TM Distance: >3 FB Neck ROM: full    Dental  (+) Teeth Intact   Pulmonary shortness of breath, neg COPD,    breath sounds clear to auscultation       Cardiovascular hypertension, (-) angina Rhythm:regular Rate:Normal     Neuro/Psych negative neurological ROS  negative psych ROS   GI/Hepatic Neg liver ROS, GERD  ,Esophageal stricture   Endo/Other  diabetes  Renal/GU Renal disease (CRI)  negative genitourinary   Musculoskeletal   Abdominal   Peds  Hematology   Anesthesia Other Findings Past Medical History: No date: Back pain No date: CRD (chronic renal disease) No date: Diabetes mellitus without complication (HCC) No date: Dizziness No date: Dysphagia No date: Dyspnea No date: Esophageal stricture     Comment:  a. s/p dilation x 7; b. 10/14/2017 EGD: benign appearing              esophageal stenosis (severe) w/ adherent scar tissue in               distal esophagus. No date: GERD (gastroesophageal reflux disease) No date: Hx of adenomatous colonic polyps No date: Hypertension No date: Osteoarthritis     Comment:  knees No date: Rheumatoid arthritis ([place]) No date: Thyroid nodule  Past Surgical History: No date: ABDOMINAL HYSTERECTOMY 08/25/2016: adenomatous colon polyps No date: CHOLECYSTECTOMY No date: COLONOSCOPY 01/14/2019: ESOPHAGOGASTRODUODENOSCOPY; N/A     Comment:  Procedure: ESOPHAGOGASTRODUODENOSCOPY (EGD);  Surgeon:               Manya Silvas, MD;  Location: [facility] ENDOSCOPY;                Service: Endoscopy;  Laterality: N/A; 10/03/2015: ESOPHAGOGASTRODUODENOSCOPY (EGD) WITH PROPOFOL; N/A     Comment:  Procedure: ESOPHAGOGASTRODUODENOSCOPY (EGD) WITH               PROPOFOL;   Surgeon: Manya Silvas, MD;  Location: [facility]              ENDOSCOPY;  Service: Endoscopy;  Laterality: N/A; 10/25/2015: ESOPHAGOGASTRODUODENOSCOPY (EGD) WITH PROPOFOL; N/A     Comment:  Procedure: ESOPHAGOGASTRODUODENOSCOPY (EGD) WITH               PROPOFOL;  Surgeon: Manya Silvas, MD;  Location: [facility]              ENDOSCOPY;  Service: Endoscopy;  Laterality: N/A;   IDDM 10/27/2016: ESOPHAGOGASTRODUODENOSCOPY (EGD) WITH PROPOFOL; N/A     Comment:  Procedure: ESOPHAGOGASTRODUODENOSCOPY (EGD) WITH               PROPOFOL;  Surgeon: Manya Silvas, MD;  Location: [facility]              ENDOSCOPY;  Service: Endoscopy;  Laterality: N/A;                Diabetic 10/14/2017: ESOPHAGOGASTRODUODENOSCOPY (EGD) WITH PROPOFOL; N/A     Comment:  Procedure: ESOPHAGOGASTRODUODENOSCOPY (EGD) WITH               PROPOFOL;  Surgeon: Manya Silvas, MD;  Location:               [facility] ENDOSCOPY;  Service: Endoscopy;  Laterality: N/A; 11/13/2017: ESOPHAGOGASTRODUODENOSCOPY (EGD) WITH PROPOFOL; N/A     Comment:  Procedure: ESOPHAGOGASTRODUODENOSCOPY (EGD) WITH               PROPOFOL;  Surgeon: Manya Silvas, MD;  Location:               [facility] ENDOSCOPY;  Service: Endoscopy;  Laterality: N/A; 05/24/2018: ESOPHAGOGASTRODUODENOSCOPY (EGD) WITH PROPOFOL; N/A     Comment:  Procedure: ESOPHAGOGASTRODUODENOSCOPY (EGD) WITH               PROPOFOL;  Surgeon: Manya Silvas, MD;  Location:               [facility] ENDOSCOPY;  Service: Endoscopy;  Laterality: N/A; 08/29/2019: ESOPHAGOGASTRODUODENOSCOPY (EGD) WITH PROPOFOL; N/A     Comment:  Procedure: ESOPHAGOGASTRODUODENOSCOPY (EGD) WITH               PROPOFOL;  Surgeon: [place], Benay Pike, MD;  Location:               [facility] ENDOSCOPY;  Service: Gastroenterology;  Laterality:               N/A; 02/06/2020: ESOPHAGOGASTRODUODENOSCOPY (EGD) WITH PROPOFOL; N/A     Comment:  Procedure: ESOPHAGOGASTRODUODENOSCOPY (EGD) WITH               PROPOFOL;  Surgeon: [place], Benay Pike,  MD;  Location:               [facility] ENDOSCOPY;  Service: Gastroenterology;  Laterality:               N/A; 07/21/2019: KYPHOPLASTY; N/A     Comment:  Procedure: L1 KYPHOPLASTY;  Surgeon: Hessie Knows, MD;               Location: [facility] ORS;  Service: Orthopedics;  Laterality:               N/A; 10/03/2015: SAVORY DILATION; N/A     Comment:  Procedure: SAVORY DILATION;  Surgeon: Manya Silvas,               MD;  Location: [facility] ENDOSCOPY;  Service: Endoscopy;                Laterality: N/A; 10/25/2015: SAVORY DILATION; N/A     Comment:  Procedure: SAVORY DILATION;  Surgeon: Manya Silvas,               MD;  Location: [facility] ENDOSCOPY;  Service: Endoscopy;                Laterality: N/A; No date: TONSILLECTOMY  BMI    Body Mass Index: 24.56 kg/m      Reproductive/Obstetrics negative OB ROS                           Anesthesia Physical Anesthesia Plan  ASA: III  Anesthesia Plan: General   Post-op Pain Management:    Induction:   PONV Risk Score and Plan: Propofol infusion and TIVA  Airway Management Planned: Natural Airway and Nasal Cannula  Additional Equipment:   Intra-op Plan:   Post-operative Plan:   Informed Consent: I have reviewed the patients History and Physical, chart, labs and discussed the procedure including the risks, benefits and alternatives for the proposed anesthesia with the patient or authorized representative who has indicated his/her understanding and acceptance.     Dental Advisory Given  Plan Discussed with: Anesthesiologist  Anesthesia Plan Comments:         Anesthesia Quick Evaluation

## 2020-03-27 LAB — SURGICAL PATHOLOGY

## 2020-04-06 DIAGNOSIS — K219 Gastro-esophageal reflux disease without esophagitis: Secondary | ICD-10-CM | POA: Diagnosis not present

## 2020-04-06 DIAGNOSIS — Z9889 Other specified postprocedural states: Secondary | ICD-10-CM | POA: Diagnosis not present

## 2020-04-06 DIAGNOSIS — Z8719 Personal history of other diseases of the digestive system: Secondary | ICD-10-CM | POA: Diagnosis not present

## 2020-04-06 DIAGNOSIS — K2970 Gastritis, unspecified, without bleeding: Secondary | ICD-10-CM | POA: Diagnosis not present

## 2020-04-06 DIAGNOSIS — R1310 Dysphagia, unspecified: Secondary | ICD-10-CM | POA: Diagnosis not present

## 2020-04-10 DIAGNOSIS — E119 Type 2 diabetes mellitus without complications: Secondary | ICD-10-CM | POA: Diagnosis not present

## 2020-04-10 DIAGNOSIS — H25813 Combined forms of age-related cataract, bilateral: Secondary | ICD-10-CM | POA: Diagnosis not present

## 2020-04-10 DIAGNOSIS — T372X5A Adverse effect of antimalarials and drugs acting on other blood protozoa, initial encounter: Secondary | ICD-10-CM | POA: Diagnosis not present

## 2020-04-10 DIAGNOSIS — H353131 Nonexudative age-related macular degeneration, bilateral, early dry stage: Secondary | ICD-10-CM | POA: Diagnosis not present

## 2020-04-10 DIAGNOSIS — H35383 Toxic maculopathy, bilateral: Secondary | ICD-10-CM | POA: Diagnosis not present

## 2020-05-29 DIAGNOSIS — M8949 Other hypertrophic osteoarthropathy, multiple sites: Secondary | ICD-10-CM | POA: Diagnosis not present

## 2020-05-29 DIAGNOSIS — M810 Age-related osteoporosis without current pathological fracture: Secondary | ICD-10-CM | POA: Diagnosis not present

## 2020-05-29 DIAGNOSIS — M0579 Rheumatoid arthritis with rheumatoid factor of multiple sites without organ or systems involvement: Secondary | ICD-10-CM | POA: Diagnosis not present

## 2020-06-05 DIAGNOSIS — M0579 Rheumatoid arthritis with rheumatoid factor of multiple sites without organ or systems involvement: Secondary | ICD-10-CM | POA: Diagnosis not present

## 2020-06-05 DIAGNOSIS — M810 Age-related osteoporosis without current pathological fracture: Secondary | ICD-10-CM | POA: Diagnosis not present

## 2020-06-05 DIAGNOSIS — M8949 Other hypertrophic osteoarthropathy, multiple sites: Secondary | ICD-10-CM | POA: Diagnosis not present

## 2020-07-02 DIAGNOSIS — E119 Type 2 diabetes mellitus without complications: Secondary | ICD-10-CM | POA: Diagnosis not present

## 2020-07-09 DIAGNOSIS — N182 Chronic kidney disease, stage 2 (mild): Secondary | ICD-10-CM | POA: Diagnosis not present

## 2020-07-09 DIAGNOSIS — E1122 Type 2 diabetes mellitus with diabetic chronic kidney disease: Secondary | ICD-10-CM | POA: Diagnosis not present

## 2020-07-09 DIAGNOSIS — K219 Gastro-esophageal reflux disease without esophagitis: Secondary | ICD-10-CM | POA: Diagnosis not present

## 2020-07-09 DIAGNOSIS — Z9889 Other specified postprocedural states: Secondary | ICD-10-CM | POA: Diagnosis not present

## 2020-08-01 DIAGNOSIS — G8929 Other chronic pain: Secondary | ICD-10-CM | POA: Diagnosis not present

## 2020-08-01 DIAGNOSIS — Z9889 Other specified postprocedural states: Secondary | ICD-10-CM | POA: Diagnosis not present

## 2020-08-01 DIAGNOSIS — M545 Low back pain: Secondary | ICD-10-CM | POA: Diagnosis not present

## 2020-08-09 DIAGNOSIS — M545 Low back pain: Secondary | ICD-10-CM | POA: Diagnosis not present

## 2020-08-14 DIAGNOSIS — M545 Low back pain: Secondary | ICD-10-CM | POA: Diagnosis not present

## 2020-08-16 DIAGNOSIS — M545 Low back pain: Secondary | ICD-10-CM | POA: Diagnosis not present

## 2020-08-21 DIAGNOSIS — M545 Low back pain: Secondary | ICD-10-CM | POA: Diagnosis not present

## 2020-08-23 DIAGNOSIS — M545 Low back pain: Secondary | ICD-10-CM | POA: Diagnosis not present

## 2020-08-29 DIAGNOSIS — N6489 Other specified disorders of breast: Secondary | ICD-10-CM | POA: Diagnosis not present

## 2020-10-22 DIAGNOSIS — R7309 Other abnormal glucose: Secondary | ICD-10-CM | POA: Diagnosis not present

## 2020-10-22 DIAGNOSIS — E119 Type 2 diabetes mellitus without complications: Secondary | ICD-10-CM | POA: Diagnosis not present

## 2020-10-24 DIAGNOSIS — Z8739 Personal history of other diseases of the musculoskeletal system and connective tissue: Secondary | ICD-10-CM | POA: Diagnosis not present

## 2020-10-24 DIAGNOSIS — R600 Localized edema: Secondary | ICD-10-CM | POA: Diagnosis not present

## 2020-10-24 DIAGNOSIS — I10 Essential (primary) hypertension: Secondary | ICD-10-CM | POA: Diagnosis not present

## 2020-10-24 DIAGNOSIS — E119 Type 2 diabetes mellitus without complications: Secondary | ICD-10-CM | POA: Diagnosis not present

## 2020-10-24 DIAGNOSIS — Z9889 Other specified postprocedural states: Secondary | ICD-10-CM | POA: Diagnosis not present

## 2020-10-24 DIAGNOSIS — K2100 Gastro-esophageal reflux disease with esophagitis, without bleeding: Secondary | ICD-10-CM | POA: Diagnosis not present

## 2020-10-24 DIAGNOSIS — N182 Chronic kidney disease, stage 2 (mild): Secondary | ICD-10-CM | POA: Diagnosis not present

## 2020-10-24 DIAGNOSIS — R0600 Dyspnea, unspecified: Secondary | ICD-10-CM | POA: Diagnosis not present

## 2020-10-29 DIAGNOSIS — D61818 Other pancytopenia: Secondary | ICD-10-CM | POA: Diagnosis not present

## 2020-10-29 DIAGNOSIS — H8110 Benign paroxysmal vertigo, unspecified ear: Secondary | ICD-10-CM | POA: Diagnosis not present

## 2020-10-29 DIAGNOSIS — E1122 Type 2 diabetes mellitus with diabetic chronic kidney disease: Secondary | ICD-10-CM | POA: Diagnosis not present

## 2020-10-29 DIAGNOSIS — N182 Chronic kidney disease, stage 2 (mild): Secondary | ICD-10-CM | POA: Diagnosis not present

## 2020-11-02 DIAGNOSIS — H8111 Benign paroxysmal vertigo, right ear: Secondary | ICD-10-CM | POA: Diagnosis not present

## 2020-11-13 DIAGNOSIS — H8111 Benign paroxysmal vertigo, right ear: Secondary | ICD-10-CM | POA: Diagnosis not present

## 2020-11-22 DIAGNOSIS — H8111 Benign paroxysmal vertigo, right ear: Secondary | ICD-10-CM | POA: Diagnosis not present

## 2020-12-05 DIAGNOSIS — H25813 Combined forms of age-related cataract, bilateral: Secondary | ICD-10-CM | POA: Diagnosis not present

## 2020-12-05 DIAGNOSIS — E119 Type 2 diabetes mellitus without complications: Secondary | ICD-10-CM | POA: Diagnosis not present

## 2020-12-05 DIAGNOSIS — T372X5A Adverse effect of antimalarials and drugs acting on other blood protozoa, initial encounter: Secondary | ICD-10-CM | POA: Diagnosis not present

## 2020-12-05 DIAGNOSIS — H35383 Toxic maculopathy, bilateral: Secondary | ICD-10-CM | POA: Diagnosis not present

## 2020-12-05 DIAGNOSIS — H353131 Nonexudative age-related macular degeneration, bilateral, early dry stage: Secondary | ICD-10-CM | POA: Diagnosis not present

## 2020-12-11 DIAGNOSIS — M8949 Other hypertrophic osteoarthropathy, multiple sites: Secondary | ICD-10-CM | POA: Diagnosis not present

## 2020-12-11 DIAGNOSIS — M0579 Rheumatoid arthritis with rheumatoid factor of multiple sites without organ or systems involvement: Secondary | ICD-10-CM | POA: Diagnosis not present

## 2020-12-11 DIAGNOSIS — M810 Age-related osteoporosis without current pathological fracture: Secondary | ICD-10-CM | POA: Diagnosis not present

## 2021-03-04 ENCOUNTER — Other Ambulatory Visit: Payer: Self-pay

## 2021-03-04 ENCOUNTER — Other Ambulatory Visit
Admission: RE | Admit: 2021-03-04 | Discharge: 2021-03-04 | Disposition: A | Discharge status: Home / Self Care | Payer: Medicare Other | Source: Ambulatory Visit | Attending: Internal Medicine | Admitting: Internal Medicine

## 2021-03-04 DIAGNOSIS — Z20822 Contact with and (suspected) exposure to covid-19: Secondary | ICD-10-CM | POA: Insufficient documentation

## 2021-03-04 DIAGNOSIS — Z01812 Encounter for preprocedural laboratory examination: Secondary | ICD-10-CM | POA: Diagnosis present

## 2021-03-04 LAB — SARS CORONAVIRUS 2 (TAT 6-24 HRS): SARS Coronavirus 2: NEGATIVE

## 2021-03-05 ENCOUNTER — Encounter: Payer: Self-pay | Admitting: Internal Medicine

## 2021-03-06 ENCOUNTER — Ambulatory Visit: Payer: Medicare Other | Admitting: Certified Registered Nurse Anesthetist

## 2021-03-06 ENCOUNTER — Ambulatory Visit
Admission: RE | Admit: 2021-03-06 | Discharge: 2021-03-06 | Disposition: A | Discharge status: Home / Self Care | Payer: Medicare Other | Attending: Internal Medicine | Admitting: Internal Medicine

## 2021-03-06 ENCOUNTER — Encounter
Admission: RE | Disposition: A | Discharge status: Home / Self Care | Payer: Self-pay | Source: Home / Self Care | Attending: Internal Medicine

## 2021-03-06 ENCOUNTER — Other Ambulatory Visit: Payer: Self-pay

## 2021-03-06 DIAGNOSIS — I129 Hypertensive chronic kidney disease with stage 1 through stage 4 chronic kidney disease, or unspecified chronic kidney disease: Secondary | ICD-10-CM | POA: Insufficient documentation

## 2021-03-06 DIAGNOSIS — N189 Chronic kidney disease, unspecified: Secondary | ICD-10-CM | POA: Diagnosis not present

## 2021-03-06 DIAGNOSIS — Z79899 Other long term (current) drug therapy: Secondary | ICD-10-CM | POA: Insufficient documentation

## 2021-03-06 DIAGNOSIS — K222 Esophageal obstruction: Secondary | ICD-10-CM | POA: Insufficient documentation

## 2021-03-06 DIAGNOSIS — K3189 Other diseases of stomach and duodenum: Secondary | ICD-10-CM | POA: Insufficient documentation

## 2021-03-06 DIAGNOSIS — Z7984 Long term (current) use of oral hypoglycemic drugs: Secondary | ICD-10-CM | POA: Insufficient documentation

## 2021-03-06 DIAGNOSIS — Z881 Allergy status to other antibiotic agents status: Secondary | ICD-10-CM | POA: Insufficient documentation

## 2021-03-06 DIAGNOSIS — E1122 Type 2 diabetes mellitus with diabetic chronic kidney disease: Secondary | ICD-10-CM | POA: Diagnosis not present

## 2021-03-06 DIAGNOSIS — R1314 Dysphagia, pharyngoesophageal phase: Secondary | ICD-10-CM | POA: Insufficient documentation

## 2021-03-06 HISTORY — DX: Other esophagitis without bleeding: K20.80

## 2021-03-06 HISTORY — DX: Personal history of other medical treatment: Z92.89

## 2021-03-06 HISTORY — PX: ESOPHAGOGASTRODUODENOSCOPY (EGD) WITH PROPOFOL: SHX5813

## 2021-03-06 LAB — GLUCOSE, CAPILLARY: Glucose-Capillary: 84 mg/dL (ref 70–99)

## 2021-03-06 SURGERY — ESOPHAGOGASTRODUODENOSCOPY (EGD) WITH PROPOFOL
Anesthesia: General

## 2021-03-06 MED ORDER — PROPOFOL 10 MG/ML IV BOLUS
INTRAVENOUS | Status: DC | PRN
  Administered 2021-03-06 (×3): 20 mg via INTRAVENOUS

## 2021-03-06 MED ORDER — LABETALOL HCL 5 MG/ML IV SOLN
INTRAVENOUS | Status: DC | PRN
  Administered 2021-03-06: 2.5 mg via INTRAVENOUS

## 2021-03-06 MED ORDER — TRIAMCINOLONE ACETONIDE 40 MG/ML IJ SUSP
40.0000 mg | Freq: Once | INTRAMUSCULAR | Status: AC
  Administered 2021-03-06: 40 mg via INTRAMUSCULAR
  Filled 2021-03-06: qty 1

## 2021-03-06 MED ORDER — PROPOFOL 500 MG/50ML IV EMUL
INTRAVENOUS | Status: DC | PRN
  Administered 2021-03-06: 75 ug/kg/min via INTRAVENOUS

## 2021-03-06 MED ORDER — ESMOLOL HCL 100 MG/10ML IV SOLN
INTRAVENOUS | Status: DC | PRN
  Administered 2021-03-06 (×2): 30 mg via INTRAVENOUS

## 2021-03-06 MED ORDER — LIDOCAINE HCL (CARDIAC) PF 100 MG/5ML IV SOSY
PREFILLED_SYRINGE | INTRAVENOUS | Status: DC | PRN
  Administered 2021-03-06: 50 mg via INTRAVENOUS

## 2021-03-06 MED ORDER — SODIUM CHLORIDE 0.9 % IV SOLN
INTRAVENOUS | Status: DC
  Administered 2021-03-06: 20 mL/h via INTRAVENOUS

## 2021-03-06 MED ORDER — PROPOFOL 10 MG/ML IV BOLUS
INTRAVENOUS | Status: AC
  Filled 2021-03-06: qty 40

## 2021-03-06 NOTE — Interval H&P Note (Signed)
History and Physical Interval Note:  03/06/2021 10:26 AM  Lisa Hernandez  has presented today for surgery, with the diagnosis of status post dilation of esophageal stricture, esophageal dyphagia, GERD.  The various methods of treatment have been discussed with the patient and family. After consideration of risks, benefits and other options for treatment, the patient has consented to  Procedure(s): ESOPHAGOGASTRODUODENOSCOPY (EGD) WITH PROPOFOL (N/A) as a surgical intervention.  The patient's history has been reviewed, patient examined, no change in status, stable for surgery.  I have reviewed the patient's chart and labs.  Questions were answered to the patient's satisfaction.     [place], [place]

## 2021-03-06 NOTE — Anesthesia Preprocedure Evaluation (Signed)
Anesthesia Evaluation  Patient identified by MRN, date of birth, ID band Patient awake    Reviewed: Allergy & Precautions, H&P , NPO status , Patient's Chart, lab work & pertinent test results  Airway Mallampati: II  TM Distance: >3 FB Neck ROM: full    Dental  (+) Teeth Intact   Pulmonary shortness of breath and with exertion, neg COPD,    breath sounds clear to auscultation       Cardiovascular hypertension, (-) angina+CHF  + Valvular Problems/Murmurs  Rhythm:regular Rate:Normal  Recently saw cardiologist, noted to be doing well from cardiac standpoint.  Echocardiogram 2D complete: (04/27/2018) INTERPRETATION MILD LV SYSTOLIC DYSFUNCTION (See above) WITH MILD LVH NORMAL RIGHT VENTRICULAR SYSTOLIC FUNCTION MILD VALVULAR REGURGITATION (See above) NO VALVULAR STENOSIS MILD MR, TR, PR EF 45%  NM Myocardial Perfusion SPECT multiple (stress and rest): (12/01/2017) IMPRESSION: Normal myocardial perfusion scan no evidence of stress-induced myocardial ischemia ejection fraction of 65% conclusion negative scan     Neuro/Psych negative neurological ROS  negative psych ROS   GI/Hepatic Neg liver ROS, GERD  ,Esophageal stricture   Endo/Other  diabetes  Renal/GU CRFRenal disease (CRI)  negative genitourinary   Musculoskeletal   Abdominal   Peds  Hematology   Anesthesia Other Findings Past Medical History: No date: Back pain No date: CRD (chronic renal disease) No date: Diabetes mellitus without complication (HCC) No date: Dizziness No date: Dysphagia No date: Dyspnea No date: Esophageal stricture     Comment:  a. s/p dilation x 7; b. 10/14/2017 EGD: benign appearing              esophageal stenosis (severe) w/ adherent scar tissue in               distal esophagus. No date: GERD (gastroesophageal reflux disease) No date: Hx of adenomatous colonic polyps No date: Hypertension No date: Osteoarthritis     Comment:   knees No date: Rheumatoid arthritis ([place]) No date: Thyroid nodule  Past Surgical History: No date: ABDOMINAL HYSTERECTOMY 08/25/2016: adenomatous colon polyps No date: CHOLECYSTECTOMY No date: COLONOSCOPY 01/14/2019: ESOPHAGOGASTRODUODENOSCOPY; N/A     Comment:  Procedure: ESOPHAGOGASTRODUODENOSCOPY (EGD);  Surgeon:               Manya Silvas, MD;  Location: [facility] ENDOSCOPY;                Service: Endoscopy;  Laterality: N/A; 10/03/2015: ESOPHAGOGASTRODUODENOSCOPY (EGD) WITH PROPOFOL; N/A     Comment:  Procedure: ESOPHAGOGASTRODUODENOSCOPY (EGD) WITH               PROPOFOL;  Surgeon: Manya Silvas, MD;  Location: [facility]              ENDOSCOPY;  Service: Endoscopy;  Laterality: N/A; 10/25/2015: ESOPHAGOGASTRODUODENOSCOPY (EGD) WITH PROPOFOL; N/A     Comment:  Procedure: ESOPHAGOGASTRODUODENOSCOPY (EGD) WITH               PROPOFOL;  Surgeon: Manya Silvas, MD;  Location: [facility]              ENDOSCOPY;  Service: Endoscopy;  Laterality: N/A;   IDDM 10/27/2016: ESOPHAGOGASTRODUODENOSCOPY (EGD) WITH PROPOFOL; N/A     Comment:  Procedure: ESOPHAGOGASTRODUODENOSCOPY (EGD) WITH               PROPOFOL;  Surgeon: Manya Silvas, MD;  Location: [facility]              ENDOSCOPY;  Service: Endoscopy;  Laterality: N/A;  Diabetic 10/14/2017: ESOPHAGOGASTRODUODENOSCOPY (EGD) WITH PROPOFOL; N/A     Comment:  Procedure: ESOPHAGOGASTRODUODENOSCOPY (EGD) WITH               PROPOFOL;  Surgeon: Manya Silvas, MD;  Location:               [facility] ENDOSCOPY;  Service: Endoscopy;  Laterality: N/A; 11/13/2017: ESOPHAGOGASTRODUODENOSCOPY (EGD) WITH PROPOFOL; N/A     Comment:  Procedure: ESOPHAGOGASTRODUODENOSCOPY (EGD) WITH               PROPOFOL;  Surgeon: Manya Silvas, MD;  Location:               [facility] ENDOSCOPY;  Service: Endoscopy;  Laterality: N/A; 05/24/2018: ESOPHAGOGASTRODUODENOSCOPY (EGD) WITH PROPOFOL; N/A     Comment:  Procedure: ESOPHAGOGASTRODUODENOSCOPY (EGD) WITH                PROPOFOL;  Surgeon: Manya Silvas, MD;  Location:               [facility] ENDOSCOPY;  Service: Endoscopy;  Laterality: N/A; 08/29/2019: ESOPHAGOGASTRODUODENOSCOPY (EGD) WITH PROPOFOL; N/A     Comment:  Procedure: ESOPHAGOGASTRODUODENOSCOPY (EGD) WITH               PROPOFOL;  Surgeon: [place], Benay Pike, MD;  Location:               [facility] ENDOSCOPY;  Service: Gastroenterology;  Laterality:               N/A; 02/06/2020: ESOPHAGOGASTRODUODENOSCOPY (EGD) WITH PROPOFOL; N/A     Comment:  Procedure: ESOPHAGOGASTRODUODENOSCOPY (EGD) WITH               PROPOFOL;  Surgeon: [place], Benay Pike, MD;  Location:               [facility] ENDOSCOPY;  Service: Gastroenterology;  Laterality:               N/A; 07/21/2019: KYPHOPLASTY; N/A     Comment:  Procedure: L1 KYPHOPLASTY;  Surgeon: Hessie Knows, MD;               Location: [facility] ORS;  Service: Orthopedics;  Laterality:               N/A; 10/03/2015: SAVORY DILATION; N/A     Comment:  Procedure: SAVORY DILATION;  Surgeon: Manya Silvas,               MD;  Location: [facility] ENDOSCOPY;  Service: Endoscopy;                Laterality: N/A; 10/25/2015: SAVORY DILATION; N/A     Comment:  Procedure: SAVORY DILATION;  Surgeon: Manya Silvas,               MD;  Location: [facility] ENDOSCOPY;  Service: Endoscopy;                Laterality: N/A; No date: TONSILLECTOMY  BMI    Body Mass Index: 24.56 kg/m      Reproductive/Obstetrics negative OB ROS                             Anesthesia Physical  Anesthesia Plan  ASA: III  Anesthesia Plan: General   Post-op Pain Management:    Induction: Intravenous  PONV Risk Score and Plan: 3 and Propofol infusion and TIVA  Airway Management Planned: Natural Airway and Nasal Cannula  Additional Equipment: None  Intra-op Plan:   Post-operative Plan:   Informed Consent: I have reviewed the patients History and Physical, chart, labs and discussed the procedure including the risks,  benefits and alternatives for the proposed anesthesia with the patient or authorized representative who has indicated his/her understanding and acceptance.     Dental Advisory Given  Plan Discussed with: Anesthesiologist  Anesthesia Plan Comments: (Discussed risks of anesthesia with patient, including possibility of difficulty with spontaneous ventilation under anesthesia necessitating airway intervention, PONV, and rare risks such as cardiac or respiratory or neurological events. Patient understands.)        Anesthesia Quick Evaluation

## 2021-03-06 NOTE — H&P (Signed)
Outpatient short stay form Pre-procedure 03/06/2021 10:24 AM Lisa Hernandez, M.D.  Primary Physician: [name], M.D.  Reason for visit:  Dysphagia, refractory esophageal stricture  History of present illness: 85 y/o female with hx of refractory esophageal stricture with only short lived relief from esophageal dilation. It was considered too risky to keep doing esophageal dilations due to advanced age/comorbidity, so intralesional steroid injection is planned today during EGD. Patient denies intractable heartburn,  hemetemesis, abdominal pain, nausea or vomiting.     Current Facility-Administered Medications:  .  0.9 %  sodium chloride infusion, , Intravenous, Continuous, [place], Benay Pike, MD, Last Rate: 20 mL/hr at 03/06/21 0947, 20 mL/hr at 03/06/21 0947  Medications Prior to Admission  Medication Sig Dispense Refill Last Dose  . atenolol (TENORMIN) 25 MG tablet Take 1 tablet (25 mg total) by mouth daily. 30 tablet 33 03/06/2021 at 0600  . folic acid (FOLVITE) 1 MG tablet Take 1 tablet (1 mg total) by mouth daily. 90 tablet 1 03/05/2021 at Unknown time  . furosemide (LASIX) 20 MG tablet Take 20 mg by mouth daily.    03/05/2021 at Unknown time  . gabapentin (NEURONTIN) 100 MG capsule Take 100 mg by mouth 3 (three) times daily.    03/05/2021 at Unknown time  . hydroxychloroquine (PLAQUENIL) 200 MG tablet Take 200 mg by mouth daily.  1 Past Week at Unknown time  . lisinopril (ZESTRIL) 20 MG tablet Take 20 mg by mouth daily.   03/06/2021 at 0600  . meclizine (ANTIVERT) 12.5 MG tablet Take 12.5 mg by mouth 3 (three) times daily as needed for dizziness.   03/05/2021 at Unknown time  . metFORMIN (GLUCOPHAGE) 500 MG tablet Take 500 mg by mouth daily.    03/05/2021 at Unknown time  . ONETOUCH DELICA LANCETS 63S MISC 3 daily   03/05/2021 at Unknown time  . pantoprazole (PROTONIX) 40 MG tablet Take 40 mg by mouth 2 (two) times daily.   03/05/2021 at Unknown time  . potassium chloride (K-DUR) 10  MEQ tablet TAKE 1 TABLET (10 MEQ TOTAL) BY MOUTH ONCE DAILY  11 03/05/2021 at Unknown time  . traMADol (ULTRAM) 50 MG tablet Take 1 tablet (50 mg total) by mouth every 6 (six) hours as needed for up to 30 doses. 20 tablet 0 03/05/2021 at Unknown time  . traZODone (DESYREL) 50 MG tablet Take 50 mg by mouth at bedtime.   3 03/05/2021 at Unknown time  . Vitamin D, Ergocalciferol, (DRISDOL) 1.25 MG (50000 UNIT) CAPS capsule Take 50,000 Units by mouth every 7 (seven) days.   03/05/2021 at Unknown time     Allergies  Allergen Reactions  . Erythromycin Ethylsuccinate [Erythromycin] Other (See Comments)    GI upset     Past Medical History:  Diagnosis Date  . Back pain   . CRD (chronic renal disease)   . Diabetes mellitus without complication ([place])   . Dizziness   . Dysphagia   . Dyspnea   . Esophageal stricture    a. s/p dilation x 7; b. 10/14/2017 EGD: benign appearing esophageal stenosis (severe) w/ adherent scar tissue in distal esophagus.  Marland Kitchen GERD (gastroesophageal reflux disease)    with esophagitis  . History of bone density study   . Hx of adenomatous colonic polyps   . Hypertension   . [place] grade C esophagitis   . Osteoarthritis    knees  . Rheumatoid arthritis ([place])   . Thyroid nodule     Review of systems:  Otherwise negative.    Physical Exam  Gen: Alert, oriented. Appears stated age.  HEENT: [place]/AT. PERRLA. Lungs: CTA, no wheezes. CV: RR nl S1, S2. Abd: soft, benign, no masses. BS+ Ext: No edema. Pulses 2+    Planned procedures: Proceed with EGD with intralesional steroid injection of distal esophageal stricture. The patient understands the nature of the planned procedure, indications, risks, alternatives and potential complications including but not limited to bleeding, infection, perforation, damage to internal organs and possible oversedation/side effects from anesthesia. The patient agrees and gives consent to proceed.  Please refer to procedure notes for  findings, recommendations and patient disposition/instructions.     Lisa Hernandez, M.D. Gastroenterology 03/06/2021  10:24 AM

## 2021-03-06 NOTE — Anesthesia Postprocedure Evaluation (Signed)
Anesthesia Post Note  Patient: Lisa Hernandez  Procedure(s) Performed: ESOPHAGOGASTRODUODENOSCOPY (EGD) WITH PROPOFOL (N/A )  Patient location during evaluation: Endoscopy Anesthesia Type: General Level of consciousness: awake and alert Pain management: pain level controlled Vital Signs Assessment: post-procedure vital signs reviewed and stable Respiratory status: spontaneous breathing, nonlabored ventilation, respiratory function stable and patient connected to nasal cannula oxygen Cardiovascular status: blood pressure returned to baseline and stable Postop Assessment: no apparent nausea or vomiting Anesthetic complications: no   No complications documented.   Last Vitals:  Vitals:   03/06/21 1213 03/06/21 1223  BP: (!) 160/89   Pulse:    Resp:    Temp:    SpO2:  99%    Last Pain:  Vitals:   03/06/21 1223  TempSrc:   PainSc: 0-No pain                 Arita Miss

## 2021-03-06 NOTE — Transfer of Care (Signed)
Immediate Anesthesia Transfer of Care Note  Patient: Lisa Hernandez  Procedure(s) Performed: ESOPHAGOGASTRODUODENOSCOPY (EGD) WITH PROPOFOL (N/A )  Patient Location: PACU  Anesthesia Type:General  Level of Consciousness: awake and alert   Airway & Oxygen Therapy: Patient Spontanous Breathing  Post-op Assessment: Post -op Vital signs reviewed and stable  Post vital signs: Reviewed and stable  Last Vitals:  Vitals Value Taken Time  BP 108/92 03/06/21 1152  Temp    Pulse 85 03/06/21 1153  Resp 21 03/06/21 1153  SpO2 93 % 03/06/21 1153  Vitals shown include unvalidated device data.  Last Pain:  Vitals:   03/06/21 0935  TempSrc: Temporal  PainSc: 0-No pain         Complications: No complications documented.

## 2021-03-06 NOTE — Op Note (Signed)
[facility] Gastroenterology Patient Name: Lisa Hernandez Procedure Date: 03/06/2021 11:24 AM MRN: [id number] Account #: [id number] Date of Birth: 05/10/1936 Admit Type: Outpatient Age: 85 Room: [facility] ENDO ROOM 2 Gender: Female Note Status: Finalized Procedure:             Upper GI endoscopy Indications:           Esophageal dysphagia, For therapy of esophageal                         stricture Providers:             Benay Pike. Marquie Aderhold MD, MD Medicines:             Propofol per Anesthesia Complications:         No immediate complications. Estimated blood loss:                         Minimal. Procedure:             Pre-Anesthesia Assessment:                        - The risks and benefits of the procedure and the                         sedation options and risks were discussed with the                         patient. All questions were answered and informed                         consent was obtained.                        - Patient identification and proposed procedure were                         verified prior to the procedure by the nurse. The                         procedure was verified in the procedure room.                        - ASA Grade Assessment: III - A patient with severe                         systemic disease.                        - After reviewing the risks and benefits, the patient                         was deemed in satisfactory condition to undergo the                         procedure.                        After obtaining informed consent, the endoscope was  passed under direct vision. Throughout the procedure,                         the patient's blood pressure, pulse, and oxygen                         saturations were monitored continuously. The Endoscope                         was introduced through the mouth, and advanced to the                         third part of duodenum. The upper GI endoscopy was                          somewhat difficult due to stricture. Successful                         completion of the procedure was aided by performing                         the maneuvers documented (below) in this report. The                         patient tolerated the procedure well. Findings:      One benign-appearing, intrinsic severe stenosis was found in the distal       esophagus. This stenosis measured 9 mm (inner diameter) x 1 cm (in       length). The stenosis was traversed after dilation. Biopsies were taken       with a cold forceps for histology.      One benign-appearing, intrinsic severe (stenosis; an endoscope cannot       pass) stenosis was found in the distal esophagus. This stenosis measured       9 mm (inner diameter) x 1 cm (in length). A TTS dilator was passed       through the scope. Dilation with a 12-13.5-15 mm balloon dilator was       performed to 15 mm. The dilation site was examined following endoscope       reinsertion and showed complete resolution of luminal narrowing.       Estimated blood loss was minimal. Area was successfully injected with 4       mL of triamcinolone (10 mg/mL) for Stricture treatment. Estimated blood       loss was minimal.      Diffuse atrophic mucosa was found in the entire examined stomach.      The examined duodenum was normal.      The exam was otherwise without abnormality. Impression:            - Benign-appearing esophageal stenosis. Biopsied.                        - Benign-appearing esophageal stenosis. Dilated.                         Injected.                        - Gastric mucosal atrophy.                        -  Normal examined duodenum.                        - The examination was otherwise normal. Recommendation:        - Patient has a contact number available for                         emergencies. The signs and symptoms of potential                         delayed complications were discussed with the patient.                          Return to normal activities tomorrow. Written                         discharge instructions were provided to the patient.                        - Mechanical soft diet for 3 days.                        - Continue present medications.                        - Use Protonix (pantoprazole) 40 mg PO BID.                        - Await pathology results.                        - Return to GI office in 3 months.                        - Follow up with Octavia Bruckner, PA-C in 3 months.                        - The findings and recommendations were discussed with                         the patient. Procedure Code(s):     --- Professional ---                        [phone], Esophagogastroduodenoscopy, flexible,                         transoral; with transendoscopic balloon dilation of                         esophagus (less than 30 mm diameter)                        43239, 59, Esophagogastroduodenoscopy, flexible,                         transoral; with biopsy, single or multiple Diagnosis Code(s):     --- Professional ---                        R13.14, Dysphagia, pharyngoesophageal phase  K31.89, Other diseases of stomach and duodenum                        K22.2, Esophageal obstruction CPT copyright 2019 American Medical Association. All rights reserved. The codes documented in this report are preliminary and upon coder review may  be revised to meet current compliance requirements. Efrain Sella MD, MD 03/06/2021 12:03:04 PM This report has been signed electronically. Number of Addenda: 0 Note Initiated On: 03/06/2021 11:24 AM Estimated Blood Loss:  Estimated blood loss was minimal.      [facility]

## 2021-03-07 ENCOUNTER — Encounter: Payer: Self-pay | Admitting: Internal Medicine

## 2021-03-08 LAB — SURGICAL PATHOLOGY

## 2021-09-16 ENCOUNTER — Encounter: Payer: Self-pay | Admitting: Ophthalmology

## 2021-09-26 NOTE — Discharge Instructions (Signed)

## 2021-09-30 ENCOUNTER — Encounter: Payer: Self-pay | Admitting: Ophthalmology

## 2021-09-30 ENCOUNTER — Encounter
Admission: RE | Disposition: A | Discharge status: Home / Self Care | Payer: Self-pay | Source: Home / Self Care | Attending: Ophthalmology

## 2021-09-30 ENCOUNTER — Ambulatory Visit
Admission: RE | Admit: 2021-09-30 | Discharge: 2021-09-30 | Disposition: A | Discharge status: Home / Self Care | Payer: Medicare Other | Attending: Ophthalmology | Admitting: Ophthalmology

## 2021-09-30 ENCOUNTER — Ambulatory Visit: Payer: Medicare Other | Admitting: Anesthesiology

## 2021-09-30 ENCOUNTER — Other Ambulatory Visit: Payer: Self-pay

## 2021-09-30 DIAGNOSIS — Z79899 Other long term (current) drug therapy: Secondary | ICD-10-CM | POA: Diagnosis not present

## 2021-09-30 DIAGNOSIS — I129 Hypertensive chronic kidney disease with stage 1 through stage 4 chronic kidney disease, or unspecified chronic kidney disease: Secondary | ICD-10-CM | POA: Diagnosis not present

## 2021-09-30 DIAGNOSIS — Z881 Allergy status to other antibiotic agents status: Secondary | ICD-10-CM | POA: Diagnosis not present

## 2021-09-30 DIAGNOSIS — Z8249 Family history of ischemic heart disease and other diseases of the circulatory system: Secondary | ICD-10-CM | POA: Diagnosis not present

## 2021-09-30 DIAGNOSIS — E1122 Type 2 diabetes mellitus with diabetic chronic kidney disease: Secondary | ICD-10-CM | POA: Diagnosis not present

## 2021-09-30 DIAGNOSIS — Z833 Family history of diabetes mellitus: Secondary | ICD-10-CM | POA: Diagnosis not present

## 2021-09-30 DIAGNOSIS — Z7984 Long term (current) use of oral hypoglycemic drugs: Secondary | ICD-10-CM | POA: Diagnosis not present

## 2021-09-30 DIAGNOSIS — E1136 Type 2 diabetes mellitus with diabetic cataract: Secondary | ICD-10-CM | POA: Diagnosis not present

## 2021-09-30 DIAGNOSIS — H2511 Age-related nuclear cataract, right eye: Secondary | ICD-10-CM | POA: Diagnosis not present

## 2021-09-30 DIAGNOSIS — N189 Chronic kidney disease, unspecified: Secondary | ICD-10-CM | POA: Insufficient documentation

## 2021-09-30 HISTORY — DX: Presence of dental prosthetic device (complete) (partial): Z97.2

## 2021-09-30 HISTORY — PX: CATARACT EXTRACTION W/PHACO: SHX586

## 2021-09-30 LAB — GLUCOSE, CAPILLARY
Glucose-Capillary: 96 mg/dL (ref 70–99)
Glucose-Capillary: 101 mg/dL — ABNORMAL HIGH (ref 70–99)

## 2021-09-30 SURGERY — PHACOEMULSIFICATION, CATARACT, WITH IOL INSERTION
Anesthesia: Monitor Anesthesia Care | Site: Eye | Laterality: Right

## 2021-09-30 MED ORDER — FENTANYL CITRATE (PF) 100 MCG/2ML IJ SOLN
INTRAMUSCULAR | Status: DC | PRN
  Administered 2021-09-30: 50 ug via INTRAVENOUS

## 2021-09-30 MED ORDER — TETRACAINE HCL 0.5 % OP SOLN
1.0000 [drp] | OPHTHALMIC | Status: DC | PRN
  Administered 2021-09-30 (×3): 1 [drp] via OPHTHALMIC

## 2021-09-30 MED ORDER — ONDANSETRON HCL 4 MG/2ML IJ SOLN: 4.0000 mg | Freq: Once | INTRAMUSCULAR | Status: DC | PRN

## 2021-09-30 MED ORDER — MOXIFLOXACIN HCL 0.5 % OP SOLN
OPHTHALMIC | Status: DC | PRN
  Administered 2021-09-30: 0.2 mL via OPHTHALMIC

## 2021-09-30 MED ORDER — SIGHTPATH DOSE#1 SODIUM HYALURONATE 10 MG/ML IO SOLUTION
PREFILLED_SYRINGE | INTRAOCULAR | Status: DC | PRN
  Administered 2021-09-30: 0.85 mL via INTRAOCULAR

## 2021-09-30 MED ORDER — LIDOCAINE HCL (PF) 2 % IJ SOLN
INTRAOCULAR | Status: DC | PRN
  Administered 2021-09-30: 1 mL via INTRAOCULAR

## 2021-09-30 MED ORDER — SIGHTPATH DOSE#1 BSS IO SOLN
INTRAOCULAR | Status: DC | PRN
  Administered 2021-09-30: 15 mL

## 2021-09-30 MED ORDER — ACETAMINOPHEN 160 MG/5ML PO SOLN: 975.0000 mg | Freq: Once | ORAL | Status: DC | PRN

## 2021-09-30 MED ORDER — LACTATED RINGERS IV SOLN: INTRAVENOUS | Status: DC

## 2021-09-30 MED ORDER — SIGHTPATH DOSE#1 SODIUM HYALURONATE 23 MG/ML IO SOLUTION
PREFILLED_SYRINGE | INTRAOCULAR | Status: DC | PRN
  Administered 2021-09-30: 0.6 mL via INTRAOCULAR

## 2021-09-30 MED ORDER — ACETAMINOPHEN 500 MG PO TABS: 1000.0000 mg | ORAL_TABLET | Freq: Once | ORAL | Status: DC | PRN

## 2021-09-30 MED ORDER — SIGHTPATH DOSE#1 BSS IO SOLN
INTRAOCULAR | Status: DC | PRN
  Administered 2021-09-30: 64 mL via OPHTHALMIC

## 2021-09-30 MED ORDER — ARMC OPHTHALMIC DILATING DROPS
1.0000 "application " | OPHTHALMIC | Status: DC | PRN
  Administered 2021-09-30 (×3): 1 via OPHTHALMIC

## 2021-09-30 SURGICAL SUPPLY — 14 items
CANNULA ANT/CHMB 27GA (MISCELLANEOUS) IMPLANT
DISSECTOR HYDRO NUCLEUS 50X22 (MISCELLANEOUS) ×2 IMPLANT
GLOVE SURG GAMMEX PI TX LF 7.5 (GLOVE) ×2 IMPLANT
GLOVE SURG SYN 8.5  E (GLOVE) ×1
GLOVE SURG SYN 8.5 E (GLOVE) ×1 IMPLANT
GOWN STRL REUS W/ TWL LRG LVL3 (GOWN DISPOSABLE) ×2 IMPLANT
GOWN STRL REUS W/TWL LRG LVL3 (GOWN DISPOSABLE) ×4
LENS IOL TECNIS EYHANCE 9.5 (Intraocular Lens) ×2 IMPLANT
MARKER SKIN DUAL TIP RULER LAB (MISCELLANEOUS) IMPLANT
PACK EYE AFTER SURG (MISCELLANEOUS) ×2 IMPLANT
SYR 3ML LL SCALE MARK (SYRINGE) ×2 IMPLANT
SYR TB 1ML LUER SLIP (SYRINGE) ×2 IMPLANT
WATER STERILE IRR 250ML POUR (IV SOLUTION) ×2 IMPLANT
WIPE NON LINTING 3.25X3.25 (MISCELLANEOUS) ×2 IMPLANT

## 2021-09-30 NOTE — Anesthesia Postprocedure Evaluation (Signed)
Anesthesia Post Note  Patient: Lisa Hernandez  Procedure(s) Performed: CATARACT EXTRACTION PHACO AND INTRAOCULAR LENS PLACEMENT (IOC) RIGHT DIABETIC (Right: Eye)     Patient location during evaluation: PACU Anesthesia Type: MAC Level of consciousness: awake and alert Pain management: pain level controlled Vital Signs Assessment: post-procedure vital signs reviewed and stable Respiratory status: spontaneous breathing, nonlabored ventilation and respiratory function stable Cardiovascular status: stable and blood pressure returned to baseline Postop Assessment: no apparent nausea or vomiting Anesthetic complications: no   No notable events documented.  April Manson

## 2021-09-30 NOTE — Op Note (Signed)
OPERATIVE NOTE  Lisa Hernandez [id number] 09/30/2021   PREOPERATIVE DIAGNOSIS:  Nuclear sclerotic cataract right eye.  H25.11   POSTOPERATIVE DIAGNOSIS:    Nuclear sclerotic cataract right eye.     PROCEDURE:  Phacoemusification with posterior chamber intraocular lens placement of the right eye   LENS:   Implant Name Type Inv. Item Serial No. Manufacturer Lot No. LRB No. Used Action  LENS IOL TECNIS EYHANCE 9.5 - [id number] Intraocular Lens LENS IOL TECNIS EYHANCE 9.5 [id number] JOHNSON   Right 1 Implanted       Procedure(s) with comments: CATARACT EXTRACTION PHACO AND INTRAOCULAR LENS PLACEMENT (IOC) RIGHT DIABETIC (Right) - 4.45 0:29.3  DIB00 +9.5   ULTRASOUND TIME: 0 minutes 29 seconds.  CDE 4.45   SURGEON:  Benay Pillow, MD, MPH  ANESTHESIOLOGIST: Anesthesiologist: April Manson, MD CRNA: Silvana Newness, CRNA   ANESTHESIA:  Topical with tetracaine drops augmented with 1% preservative-free intracameral lidocaine.  ESTIMATED BLOOD LOSS: less than 1 mL.   COMPLICATIONS:  None.   DESCRIPTION OF PROCEDURE:  The patient was identified in the holding room and transported to the operating room and placed in the supine position under the operating microscope.  The right eye was identified as the operative eye and it was prepped and draped in the usual sterile ophthalmic fashion.   A 1.0 millimeter clear-corneal paracentesis was made at the 10:30 position. 0.5 ml of preservative-free 1% lidocaine with epinephrine was injected into the anterior chamber.  The anterior chamber was filled with Healon 5 viscoelastic.  A 2.4 millimeter keratome was used to make a near-clear corneal incision at the 8:00 position.  A curvilinear capsulorrhexis was made with a cystotome and capsulorrhexis forceps.  Balanced salt solution was used to hydrodissect and hydrodelineate the nucleus.   Phacoemulsification was then used in stop and chop fashion to remove the lens nucleus and epinucleus.  The  remaining cortex was then removed using the irrigation and aspiration handpiece. Healon was then placed into the capsular bag to distend it for lens placement.  A lens was then injected into the capsular bag.  The remaining viscoelastic was aspirated.   Wounds were hydrated with balanced salt solution.  The anterior chamber was inflated to a physiologic pressure with balanced salt solution.   Intracameral vigamox 0.1 mL undiluted was injected into the eye and a drop placed onto the ocular surface.  No wound leaks were noted.  The patient was taken to the recovery room in stable condition without complications of anesthesia or surgery  Benay Pillow 09/30/2021, 8:36 AM

## 2021-09-30 NOTE — Anesthesia Preprocedure Evaluation (Addendum)
Anesthesia Evaluation  Patient identified by MRN, date of birth, ID band Patient awake    Reviewed: Allergy & Precautions, H&P , NPO status , Patient's Chart, lab work & pertinent test results, reviewed documented beta blocker date and time   Airway Mallampati: II  TM Distance: >3 FB Neck ROM: full    Dental no notable dental hx.    Pulmonary neg pulmonary ROS,    Pulmonary exam normal breath sounds clear to auscultation       Cardiovascular Exercise Tolerance: Good hypertension, negative cardio ROS   Rhythm:regular Rate:Normal     Neuro/Psych negative neurological ROS  negative psych ROS   GI/Hepatic negative GI ROS, Neg liver ROS, GERD  ,Esophageal stricture, no longer dilatable, treated with esophageal steroid injection   Endo/Other  negative endocrine ROSdiabetes  Renal/GU Renal diseasenegative Renal ROS  negative genitourinary   Musculoskeletal  (+) Arthritis  (RA),   Abdominal   Peds  Hematology negative hematology ROS (+) Blood dyscrasia, anemia ,   Anesthesia Other Findings Patient fell this morning walking out the door and tripping over dog. Small 3cm abrasian to left temple. Patient denies LOC or other symptoms.  Reproductive/Obstetrics negative OB ROS                            Anesthesia Physical Anesthesia Plan  ASA: 2  Anesthesia Plan: MAC   Post-op Pain Management:    Induction:   PONV Risk Score and Plan: 2 and TIVA and Treatment may vary due to age or medical condition  Airway Management Planned:   Additional Equipment:   Intra-op Plan:   Post-operative Plan:   Informed Consent: I have reviewed the patients History and Physical, chart, labs and discussed the procedure including the risks, benefits and alternatives for the proposed anesthesia with the patient or authorized representative who has indicated his/her understanding and acceptance.     Dental  Advisory Given  Plan Discussed with: CRNA  Anesthesia Plan Comments:         Anesthesia Quick Evaluation

## 2021-09-30 NOTE — H&P (Signed)
[place]   Primary Care Physician:  Baxter Hire, MD Ophthalmologist: Dr. Benay Pillow  Pre-Procedure History & Physical: HPI:  Lisa Hernandez is a 85 y.o. female here for cataract surgery.   Past Medical History:  Diagnosis Date   Back pain    CRD (chronic renal disease)    Diabetes mellitus without complication (HCC)    Dizziness    Dysphagia    Dyspnea    Esophageal stricture    a. s/p dilation x 7; b. 10/14/2017 EGD: benign appearing esophageal stenosis (severe) w/ adherent scar tissue in distal esophagus.   GERD (gastroesophageal reflux disease)    with esophagitis   History of bone density study    Hx of adenomatous colonic polyps    Hypertension    [place] grade C esophagitis    Osteoarthritis    knees   Rheumatoid arthritis (HCC)    Thyroid nodule    Wears dentures    full upper and lower    Past Surgical History:  Procedure Laterality Date   ABDOMINAL HYSTERECTOMY     adenomatous colon polyps  08/25/2016   CHOLECYSTECTOMY     COLONOSCOPY     ESOPHAGOGASTRODUODENOSCOPY N/A 01/14/2019   Procedure: ESOPHAGOGASTRODUODENOSCOPY (EGD);  Surgeon: Manya Silvas, MD;  Location: [facility] ENDOSCOPY;  Service: Endoscopy;  Laterality: N/A;   ESOPHAGOGASTRODUODENOSCOPY (EGD) WITH PROPOFOL N/A 10/03/2015   Procedure: ESOPHAGOGASTRODUODENOSCOPY (EGD) WITH PROPOFOL;  Surgeon: Manya Silvas, MD;  Location: [facility] ENDOSCOPY;  Service: Endoscopy;  Laterality: N/A;   ESOPHAGOGASTRODUODENOSCOPY (EGD) WITH PROPOFOL N/A 10/25/2015   Procedure: ESOPHAGOGASTRODUODENOSCOPY (EGD) WITH PROPOFOL;  Surgeon: Manya Silvas, MD;  Location: [facility] ENDOSCOPY;  Service: Endoscopy;  Laterality: N/A;   IDDM   ESOPHAGOGASTRODUODENOSCOPY (EGD) WITH PROPOFOL N/A 10/27/2016   Procedure: ESOPHAGOGASTRODUODENOSCOPY (EGD) WITH PROPOFOL;  Surgeon: Manya Silvas, MD;  Location: [facility] ENDOSCOPY;  Service: Endoscopy;  Laterality: N/A;  Diabetic   ESOPHAGOGASTRODUODENOSCOPY (EGD) WITH PROPOFOL  N/A 10/14/2017   Procedure: ESOPHAGOGASTRODUODENOSCOPY (EGD) WITH PROPOFOL;  Surgeon: Manya Silvas, MD;  Location: [facility] ENDOSCOPY;  Service: Endoscopy;  Laterality: N/A;   ESOPHAGOGASTRODUODENOSCOPY (EGD) WITH PROPOFOL N/A 11/13/2017   Procedure: ESOPHAGOGASTRODUODENOSCOPY (EGD) WITH PROPOFOL;  Surgeon: Manya Silvas, MD;  Location: [facility] ENDOSCOPY;  Service: Endoscopy;  Laterality: N/A;   ESOPHAGOGASTRODUODENOSCOPY (EGD) WITH PROPOFOL N/A 05/24/2018   Procedure: ESOPHAGOGASTRODUODENOSCOPY (EGD) WITH PROPOFOL;  Surgeon: Manya Silvas, MD;  Location: [facility] ENDOSCOPY;  Service: Endoscopy;  Laterality: N/A;   ESOPHAGOGASTRODUODENOSCOPY (EGD) WITH PROPOFOL N/A 08/29/2019   Procedure: ESOPHAGOGASTRODUODENOSCOPY (EGD) WITH PROPOFOL;  Surgeon: Toledo, Benay Pike, MD;  Location: [facility] ENDOSCOPY;  Service: Gastroenterology;  Laterality: N/A;   ESOPHAGOGASTRODUODENOSCOPY (EGD) WITH PROPOFOL N/A 02/06/2020   Procedure: ESOPHAGOGASTRODUODENOSCOPY (EGD) WITH PROPOFOL;  Surgeon: Toledo, Benay Pike, MD;  Location: [facility] ENDOSCOPY;  Service: Gastroenterology;  Laterality: N/A;   ESOPHAGOGASTRODUODENOSCOPY (EGD) WITH PROPOFOL N/A 03/22/2020   Procedure: ESOPHAGOGASTRODUODENOSCOPY (EGD) WITH PROPOFOL;  Surgeon: Toledo, Benay Pike, MD;  Location: [facility] ENDOSCOPY;  Service: Gastroenterology;  Laterality: N/A;   ESOPHAGOGASTRODUODENOSCOPY (EGD) WITH PROPOFOL N/A 03/06/2021   Procedure: ESOPHAGOGASTRODUODENOSCOPY (EGD) WITH PROPOFOL;  Surgeon: Toledo, Benay Pike, MD;  Location: [facility] ENDOSCOPY;  Service: Gastroenterology;  Laterality: N/A;   KYPHOPLASTY N/A 07/21/2019   Procedure: L1 KYPHOPLASTY;  Surgeon: Hessie Knows, MD;  Location: [facility] ORS;  Service: Orthopedics;  Laterality: N/A;   SAVORY DILATION N/A 10/03/2015   Procedure: SAVORY DILATION;  Surgeon: Manya Silvas, MD;  Location: [facility] ENDOSCOPY;  Service: Endoscopy;  Laterality: N/A;  SAVORY DILATION N/A 10/25/2015   Procedure: SAVORY DILATION;  Surgeon: Manya Silvas, MD;  Location: [facility] ENDOSCOPY;  Service: Endoscopy;  Laterality: N/A;   TONSILLECTOMY      Prior to Admission medications   Medication Sig Start Date End Date Taking? Authorizing Provider  atenolol (TENORMIN) 25 MG tablet Take 1 tablet (25 mg total) by mouth daily. 07/21/19  Yes Hessie Knows, MD  folic acid (FOLVITE) 1 MG tablet Take 1 tablet (1 mg total) by mouth daily. 09/05/19  Yes Earlie Server, MD  furosemide (LASIX) 20 MG tablet Take 20 mg by mouth daily.  05/03/18  Yes [provider]  gabapentin (NEURONTIN) 100 MG capsule Take 100 mg by mouth 3 (three) times daily.    Yes [provider]  lisinopril (ZESTRIL) 20 MG tablet Take 20 mg by mouth daily. 05/13/19  Yes [provider]  meclizine (ANTIVERT) 12.5 MG tablet Take 12.5 mg by mouth 3 (three) times daily as needed for dizziness.   Yes [provider]  metFORMIN (GLUCOPHAGE) 500 MG tablet Take 500 mg by mouth daily.    Yes [provider]  pantoprazole (PROTONIX) 40 MG tablet Take 40 mg by mouth 2 (two) times daily. 06/06/19  Yes [provider]  Vitamin D, Ergocalciferol, (DRISDOL) 1.25 MG (50000 UNIT) CAPS capsule Take 50,000 Units by mouth every 7 (seven) days.   Yes [provider]  Jonetta Speak LANCETS 87F MISC 3 daily    [provider]  potassium chloride (K-DUR) 10 MEQ tablet TAKE 1 TABLET (10 MEQ TOTAL) BY MOUTH ONCE DAILY 05/03/18   [provider]    Allergies as of 08/21/2021 - Review Complete 03/06/2021  Allergen Reaction Noted   Erythromycin ethylsuccinate [erythromycin] Other (See Comments) 10/02/2015    Family History  Problem Relation Age of Onset   Diabetes Mother    Cancer Mother        died in her 55s   Diabetes Father    Heart attack Father        died @ 6   Diabetes Sister     Social History   Socioeconomic History   Marital status: Divorced    Spouse name: Not on file   Number of children: Not on file   Years of  education: Not on file   Highest education level: Not on file  Occupational History   Occupation: retired  Tobacco Use   Smoking status: Never   Smokeless tobacco: Never  Vaping Use   Vaping Use: Never used  Substance and Sexual Activity   Alcohol use: No   Drug use: No   Sexual activity: Never  Other Topics Concern   Not on file  Social History Narrative   Lives in [place] by herself.  Sister nearby.  Active.   Social Determinants of Health   Financial Resource Strain: Not on file  Food Insecurity: Not on file  Transportation Needs: Not on file  Physical Activity: Not on file  Stress: Not on file  Social Connections: Not on file  Intimate Partner Violence: Not on file    Review of Systems: See HPI, otherwise negative ROS  Physical Exam: BP (!) 161/56 (BP Location: Right Arm) Comment (BP Location): forearm  Pulse 65   Temp (!) 97.1 F (36.2 C)   Resp 16   Ht 5\' 1"  (1.549 m)   Wt 59 kg   BMI 24.56 kg/m  General:   Alert, cooperative in NAD Head:  Normocephalic and atraumatic.  Respiratory:  Normal work of breathing. Cardiovascular:  RRR  Impression/Plan: [name] is here for cataract surgery.  Risks, benefits, limitations, and alternatives regarding cataract surgery have been reviewed with the patient.  Questions have been answered.  All parties agreeable.   Benay Pillow, MD  09/30/2021, 8:06 AM

## 2021-09-30 NOTE — Transfer of Care (Signed)
Immediate Anesthesia Transfer of Care Note  Patient: Lisa Hernandez  Procedure(s) Performed: CATARACT EXTRACTION PHACO AND INTRAOCULAR LENS PLACEMENT (IOC) RIGHT DIABETIC (Right: Eye)  Patient Location: PACU  Anesthesia Type: MAC  Level of Consciousness: awake, alert  and patient cooperative  Airway and Oxygen Therapy: Patient Spontanous Breathing and Patient connected to supplemental oxygen  Post-op Assessment: Post-op Vital signs reviewed, Patient's Cardiovascular Status Stable, Respiratory Function Stable, Patent Airway and No signs of Nausea or vomiting  Post-op Vital Signs: Reviewed and stable  Complications: No notable events documented.

## 2021-10-01 ENCOUNTER — Encounter: Payer: Self-pay | Admitting: Oncology

## 2021-10-01 ENCOUNTER — Encounter: Payer: Self-pay | Admitting: Ophthalmology

## 2021-10-10 NOTE — Discharge Instructions (Signed)

## 2021-10-11 ENCOUNTER — Other Ambulatory Visit: Payer: Self-pay

## 2021-10-14 ENCOUNTER — Other Ambulatory Visit: Payer: Self-pay

## 2021-10-14 ENCOUNTER — Encounter
Admission: RE | Disposition: A | Discharge status: Home / Self Care | Payer: Self-pay | Source: Home / Self Care | Attending: Ophthalmology

## 2021-10-14 ENCOUNTER — Ambulatory Visit
Admission: RE | Admit: 2021-10-14 | Discharge: 2021-10-14 | Disposition: A | Discharge status: Home / Self Care | Payer: Medicare Other | Attending: Ophthalmology | Admitting: Ophthalmology

## 2021-10-14 ENCOUNTER — Ambulatory Visit: Payer: Medicare Other | Admitting: Anesthesiology

## 2021-10-14 ENCOUNTER — Encounter: Payer: Self-pay | Admitting: Ophthalmology

## 2021-10-14 DIAGNOSIS — Z8249 Family history of ischemic heart disease and other diseases of the circulatory system: Secondary | ICD-10-CM | POA: Diagnosis not present

## 2021-10-14 DIAGNOSIS — E1122 Type 2 diabetes mellitus with diabetic chronic kidney disease: Secondary | ICD-10-CM | POA: Diagnosis not present

## 2021-10-14 DIAGNOSIS — N189 Chronic kidney disease, unspecified: Secondary | ICD-10-CM | POA: Diagnosis not present

## 2021-10-14 DIAGNOSIS — H2512 Age-related nuclear cataract, left eye: Secondary | ICD-10-CM | POA: Diagnosis not present

## 2021-10-14 DIAGNOSIS — Z961 Presence of intraocular lens: Secondary | ICD-10-CM | POA: Diagnosis not present

## 2021-10-14 DIAGNOSIS — Z881 Allergy status to other antibiotic agents status: Secondary | ICD-10-CM | POA: Diagnosis not present

## 2021-10-14 DIAGNOSIS — Z833 Family history of diabetes mellitus: Secondary | ICD-10-CM | POA: Insufficient documentation

## 2021-10-14 DIAGNOSIS — I129 Hypertensive chronic kidney disease with stage 1 through stage 4 chronic kidney disease, or unspecified chronic kidney disease: Secondary | ICD-10-CM | POA: Diagnosis not present

## 2021-10-14 DIAGNOSIS — Z79899 Other long term (current) drug therapy: Secondary | ICD-10-CM | POA: Diagnosis not present

## 2021-10-14 DIAGNOSIS — Z7984 Long term (current) use of oral hypoglycemic drugs: Secondary | ICD-10-CM | POA: Insufficient documentation

## 2021-10-14 DIAGNOSIS — Z9841 Cataract extraction status, right eye: Secondary | ICD-10-CM | POA: Insufficient documentation

## 2021-10-14 DIAGNOSIS — E1136 Type 2 diabetes mellitus with diabetic cataract: Secondary | ICD-10-CM | POA: Diagnosis not present

## 2021-10-14 DIAGNOSIS — M069 Rheumatoid arthritis, unspecified: Secondary | ICD-10-CM | POA: Insufficient documentation

## 2021-10-14 HISTORY — PX: CATARACT EXTRACTION W/PHACO: SHX586

## 2021-10-14 LAB — GLUCOSE, CAPILLARY
Glucose-Capillary: 93 mg/dL (ref 70–99)
Glucose-Capillary: 81 mg/dL (ref 70–99)

## 2021-10-14 SURGERY — PHACOEMULSIFICATION, CATARACT, WITH IOL INSERTION
Anesthesia: Monitor Anesthesia Care | Site: Eye | Laterality: Left

## 2021-10-14 MED ORDER — TETRACAINE HCL 0.5 % OP SOLN
1.0000 [drp] | OPHTHALMIC | Status: DC | PRN
  Administered 2021-10-14 (×3): 1 [drp] via OPHTHALMIC

## 2021-10-14 MED ORDER — SIGHTPATH DOSE#1 BSS IO SOLN
INTRAOCULAR | Status: DC | PRN
  Administered 2021-10-14: 15 mL

## 2021-10-14 MED ORDER — SIGHTPATH DOSE#1 BSS IO SOLN
INTRAOCULAR | Status: DC | PRN
  Administered 2021-10-14: 67 mL via OPHTHALMIC

## 2021-10-14 MED ORDER — SIGHTPATH DOSE#1 SODIUM HYALURONATE 23 MG/ML IO SOLUTION
PREFILLED_SYRINGE | INTRAOCULAR | Status: DC | PRN
  Administered 2021-10-14: 0.6 mL via INTRAOCULAR

## 2021-10-14 MED ORDER — MOXIFLOXACIN HCL 0.5 % OP SOLN
OPHTHALMIC | Status: DC | PRN
  Administered 2021-10-14: 0.2 mL via OPHTHALMIC

## 2021-10-14 MED ORDER — ACETAMINOPHEN 160 MG/5ML PO SOLN: 325.0000 mg | Freq: Once | ORAL | Status: DC

## 2021-10-14 MED ORDER — MIDAZOLAM HCL 2 MG/2ML IJ SOLN
INTRAMUSCULAR | Status: DC | PRN
  Administered 2021-10-14: .5 mg via INTRAVENOUS

## 2021-10-14 MED ORDER — ARMC OPHTHALMIC DILATING DROPS
1.0000 "application " | OPHTHALMIC | Status: DC | PRN
  Administered 2021-10-14 (×3): 1 via OPHTHALMIC

## 2021-10-14 MED ORDER — LACTATED RINGERS IV SOLN: INTRAVENOUS | Status: DC

## 2021-10-14 MED ORDER — LIDOCAINE HCL (PF) 2 % IJ SOLN
INTRAOCULAR | Status: DC | PRN
  Administered 2021-10-14: 1 mL via INTRAOCULAR

## 2021-10-14 MED ORDER — SIGHTPATH DOSE#1 SODIUM HYALURONATE 10 MG/ML IO SOLUTION
PREFILLED_SYRINGE | INTRAOCULAR | Status: DC | PRN
  Administered 2021-10-14: 0.85 mL via INTRAOCULAR

## 2021-10-14 MED ORDER — ACETAMINOPHEN 325 MG PO TABS: 325.0000 mg | ORAL_TABLET | Freq: Once | ORAL | Status: DC

## 2021-10-14 MED ORDER — FENTANYL CITRATE (PF) 100 MCG/2ML IJ SOLN
INTRAMUSCULAR | Status: DC | PRN
  Administered 2021-10-14: 50 ug via INTRAVENOUS

## 2021-10-14 SURGICAL SUPPLY — 14 items
CANNULA ANT/CHMB 27GA (MISCELLANEOUS) ×2 IMPLANT
DISSECTOR HYDRO NUCLEUS 50X22 (MISCELLANEOUS) ×2 IMPLANT
GLOVE SURG GAMMEX PI TX LF 7.5 (GLOVE) ×2 IMPLANT
GLOVE SURG SYN 8.5  E (GLOVE) ×1
GLOVE SURG SYN 8.5 E (GLOVE) ×1 IMPLANT
GOWN STRL REUS W/ TWL LRG LVL3 (GOWN DISPOSABLE) ×2 IMPLANT
GOWN STRL REUS W/TWL LRG LVL3 (GOWN DISPOSABLE) ×4
LENS IOL TECNIS EYHANCE 9.5 (Intraocular Lens) ×2 IMPLANT
MARKER SKIN DUAL TIP RULER LAB (MISCELLANEOUS) ×2 IMPLANT
PACK EYE AFTER SURG (MISCELLANEOUS) ×2 IMPLANT
SYR 3ML LL SCALE MARK (SYRINGE) ×2 IMPLANT
SYR TB 1ML LUER SLIP (SYRINGE) ×2 IMPLANT
WATER STERILE IRR 250ML POUR (IV SOLUTION) ×2 IMPLANT
WIPE NON LINTING 3.25X3.25 (MISCELLANEOUS) ×2 IMPLANT

## 2021-10-14 NOTE — Op Note (Signed)
OPERATIVE NOTE  Lisa Hernandez [id number] 10/14/2021   PREOPERATIVE DIAGNOSIS:  Nuclear sclerotic cataract left eye.  H25.12   POSTOPERATIVE DIAGNOSIS:    Nuclear sclerotic cataract left eye.     PROCEDURE:  Phacoemusification with posterior chamber intraocular lens placement of the left eye   LENS:   Implant Name Type Inv. Item Serial No. Manufacturer Lot No. LRB No. Used Action  LENS IOL TECNIS EYHANCE 9.5 - [id number] Intraocular Lens LENS IOL TECNIS EYHANCE 9.5 [id number] JOHNSON   Left 1 Implanted      Procedure(s) with comments: CATARACT EXTRACTION PHACO AND INTRAOCULAR LENS PLACEMENT (IOC) LEFT DIABETIC (Left) - Diabetic 4.97 00:34.0  DIB00 +9.5   SURGEON:  Benay Pillow, MD, MPH   ANESTHESIA:  Topical with tetracaine drops augmented with 1% preservative-free intracameral lidocaine.  ESTIMATED BLOOD LOSS: <1 mL   COMPLICATIONS:  None.   DESCRIPTION OF PROCEDURE:  The patient was identified in the holding room and transported to the operating room and placed in the supine position under the operating microscope.  The left eye was identified as the operative eye and it was prepped and draped in the usual sterile ophthalmic fashion.   A 1.0 millimeter clear-corneal paracentesis was made at the 5:00 position. 0.5 ml of preservative-free 1% lidocaine with epinephrine was injected into the anterior chamber.  The anterior chamber was filled with Healon 5 viscoelastic.  A 2.4 millimeter keratome was used to make a near-clear corneal incision at the 2:00 position.  A curvilinear capsulorrhexis was made with a cystotome and capsulorrhexis forceps.  Balanced salt solution was used to hydrodissect and hydrodelineate the nucleus.   Phacoemulsification was then used in stop and chop fashion to remove the lens nucleus and epinucleus.  The remaining cortex was then removed using the irrigation and aspiration handpiece. Healon was then placed into the capsular bag to distend it for lens  placement.  A lens was then injected into the capsular bag.  The remaining viscoelastic was aspirated.   Wounds were hydrated with balanced salt solution.  The anterior chamber was inflated to a physiologic pressure with balanced salt solution.  Intracameral vigamox 0.1 mL undiltued was injected into the eye and a drop placed onto the ocular surface.  No wound leaks were noted.  The patient was taken to the recovery room in stable condition without complications of anesthesia or surgery  Benay Pillow 10/14/2021, 8:50 AM

## 2021-10-14 NOTE — Transfer of Care (Signed)
Immediate Anesthesia Transfer of Care Note  Patient: Lisa Hernandez  Procedure(s) Performed: CATARACT EXTRACTION PHACO AND INTRAOCULAR LENS PLACEMENT (IOC) LEFT DIABETIC (Left: Eye)  Patient Location: PACU  Anesthesia Type: MAC  Level of Consciousness: awake, alert  and patient cooperative  Airway and Oxygen Therapy: Patient Spontanous Breathing and Patient connected to supplemental oxygen  Post-op Assessment: Post-op Vital signs reviewed, Patient's Cardiovascular Status Stable, Respiratory Function Stable, Patent Airway and No signs of Nausea or vomiting  Post-op Vital Signs: Reviewed and stable  Complications: No notable events documented.

## 2021-10-14 NOTE — Anesthesia Preprocedure Evaluation (Addendum)
Anesthesia Evaluation  Patient identified by MRN, date of birth, ID band Patient awake    Reviewed: Allergy & Precautions, H&P , NPO status , Patient's Chart, lab work & pertinent test results, reviewed documented beta blocker date and time   Airway Mallampati: II  TM Distance: >3 FB Neck ROM: full    Dental  (+) Upper Dentures, Lower Dentures   Pulmonary neg pulmonary ROS,    Pulmonary exam normal breath sounds clear to auscultation       Cardiovascular Exercise Tolerance: Good hypertension, negative cardio ROS Normal cardiovascular exam Rhythm:regular Rate:Normal     Neuro/Psych negative neurological ROS  negative psych ROS   GI/Hepatic negative GI ROS, Neg liver ROS, GERD  ,Esophageal stricture, no longer dilatable, treated with esophageal steroid injection   Endo/Other  negative endocrine ROSdiabetes  Renal/GU Renal diseasenegative Renal ROS  negative genitourinary   Musculoskeletal  (+) Arthritis  (RA),   Abdominal   Peds  Hematology negative hematology ROS (+) Blood dyscrasia, anemia ,   Anesthesia Other Findings Patient fell this morning walking out the door and tripping over dog. Small 3cm abrasian to left temple. Patient denies LOC or other symptoms.  Reproductive/Obstetrics negative OB ROS                            Anesthesia Physical  Anesthesia Plan  ASA: 2  Anesthesia Plan: MAC   Post-op Pain Management:    Induction:   PONV Risk Score and Plan: 2 and Treatment may vary due to age or medical condition, TIVA and Midazolam  Airway Management Planned:   Additional Equipment:   Intra-op Plan:   Post-operative Plan:   Informed Consent: I have reviewed the patients History and Physical, chart, labs and discussed the procedure including the risks, benefits and alternatives for the proposed anesthesia with the patient or authorized representative who has indicated  his/her understanding and acceptance.     Dental Advisory Given  Plan Discussed with: CRNA  Anesthesia Plan Comments:         Anesthesia Quick Evaluation

## 2021-10-14 NOTE — H&P (Signed)
[place]   Primary Care Physician:  Baxter Hire, MD Ophthalmologist: Dr. Benay Pillow  Pre-Procedure History & Physical: HPI:  Lisa Hernandez is a 85 y.o. female here for cataract surgery.   Past Medical History:  Diagnosis Date   Back pain    CRD (chronic renal disease)    Diabetes mellitus without complication (HCC)    Dizziness    Dysphagia    Dyspnea    Esophageal stricture    a. s/p dilation x 7; b. 10/14/2017 EGD: benign appearing esophageal stenosis (severe) w/ adherent scar tissue in distal esophagus.   GERD (gastroesophageal reflux disease)    with esophagitis   History of bone density study    Hx of adenomatous colonic polyps    Hypertension    [place] grade C esophagitis    Osteoarthritis    knees   Rheumatoid arthritis (HCC)    Thyroid nodule    Wears dentures    full upper and lower    Past Surgical History:  Procedure Laterality Date   ABDOMINAL HYSTERECTOMY     adenomatous colon polyps  08/25/2016   CATARACT EXTRACTION W/PHACO Right 09/30/2021   Procedure: CATARACT EXTRACTION PHACO AND INTRAOCULAR LENS PLACEMENT ([place]) RIGHT DIABETIC;  Surgeon: Eulogio Bear, MD;  Location: [place];  Service: Ophthalmology;  Laterality: Right;  4.45 0:29.3   CHOLECYSTECTOMY     COLONOSCOPY     ESOPHAGOGASTRODUODENOSCOPY N/A 01/14/2019   Procedure: ESOPHAGOGASTRODUODENOSCOPY (EGD);  Surgeon: Manya Silvas, MD;  Location: [facility] ENDOSCOPY;  Service: Endoscopy;  Laterality: N/A;   ESOPHAGOGASTRODUODENOSCOPY (EGD) WITH PROPOFOL N/A 10/03/2015   Procedure: ESOPHAGOGASTRODUODENOSCOPY (EGD) WITH PROPOFOL;  Surgeon: Manya Silvas, MD;  Location: [facility] ENDOSCOPY;  Service: Endoscopy;  Laterality: N/A;   ESOPHAGOGASTRODUODENOSCOPY (EGD) WITH PROPOFOL N/A 10/25/2015   Procedure: ESOPHAGOGASTRODUODENOSCOPY (EGD) WITH PROPOFOL;  Surgeon: Manya Silvas, MD;  Location: [facility] ENDOSCOPY;  Service: Endoscopy;  Laterality: N/A;   IDDM    ESOPHAGOGASTRODUODENOSCOPY (EGD) WITH PROPOFOL N/A 10/27/2016   Procedure: ESOPHAGOGASTRODUODENOSCOPY (EGD) WITH PROPOFOL;  Surgeon: Manya Silvas, MD;  Location: [facility] ENDOSCOPY;  Service: Endoscopy;  Laterality: N/A;  Diabetic   ESOPHAGOGASTRODUODENOSCOPY (EGD) WITH PROPOFOL N/A 10/14/2017   Procedure: ESOPHAGOGASTRODUODENOSCOPY (EGD) WITH PROPOFOL;  Surgeon: Manya Silvas, MD;  Location: [facility] ENDOSCOPY;  Service: Endoscopy;  Laterality: N/A;   ESOPHAGOGASTRODUODENOSCOPY (EGD) WITH PROPOFOL N/A 11/13/2017   Procedure: ESOPHAGOGASTRODUODENOSCOPY (EGD) WITH PROPOFOL;  Surgeon: Manya Silvas, MD;  Location: [facility] ENDOSCOPY;  Service: Endoscopy;  Laterality: N/A;   ESOPHAGOGASTRODUODENOSCOPY (EGD) WITH PROPOFOL N/A 05/24/2018   Procedure: ESOPHAGOGASTRODUODENOSCOPY (EGD) WITH PROPOFOL;  Surgeon: Manya Silvas, MD;  Location: [facility] ENDOSCOPY;  Service: Endoscopy;  Laterality: N/A;   ESOPHAGOGASTRODUODENOSCOPY (EGD) WITH PROPOFOL N/A 08/29/2019   Procedure: ESOPHAGOGASTRODUODENOSCOPY (EGD) WITH PROPOFOL;  Surgeon: Toledo, Benay Pike, MD;  Location: [facility] ENDOSCOPY;  Service: Gastroenterology;  Laterality: N/A;   ESOPHAGOGASTRODUODENOSCOPY (EGD) WITH PROPOFOL N/A 02/06/2020   Procedure: ESOPHAGOGASTRODUODENOSCOPY (EGD) WITH PROPOFOL;  Surgeon: Toledo, Benay Pike, MD;  Location: [facility] ENDOSCOPY;  Service: Gastroenterology;  Laterality: N/A;   ESOPHAGOGASTRODUODENOSCOPY (EGD) WITH PROPOFOL N/A 03/22/2020   Procedure: ESOPHAGOGASTRODUODENOSCOPY (EGD) WITH PROPOFOL;  Surgeon: Toledo, Benay Pike, MD;  Location: [facility] ENDOSCOPY;  Service: Gastroenterology;  Laterality: N/A;   ESOPHAGOGASTRODUODENOSCOPY (EGD) WITH PROPOFOL N/A 03/06/2021   Procedure: ESOPHAGOGASTRODUODENOSCOPY (EGD) WITH PROPOFOL;  Surgeon: Toledo, Benay Pike, MD;  Location: [facility] ENDOSCOPY;  Service: Gastroenterology;  Laterality: N/A;   KYPHOPLASTY N/A 07/21/2019   Procedure: L1 KYPHOPLASTY;  Surgeon: Hessie Knows,  MD;  Location: [facility] ORS;   Service: Orthopedics;  Laterality: N/A;   SAVORY DILATION N/A 10/03/2015   Procedure: SAVORY DILATION;  Surgeon: Manya Silvas, MD;  Location: [facility] ENDOSCOPY;  Service: Endoscopy;  Laterality: N/A;   SAVORY DILATION N/A 10/25/2015   Procedure: SAVORY DILATION;  Surgeon: Manya Silvas, MD;  Location: [facility] ENDOSCOPY;  Service: Endoscopy;  Laterality: N/A;   TONSILLECTOMY      Prior to Admission medications   Medication Sig Start Date End Date Taking? Authorizing Provider  atenolol (TENORMIN) 25 MG tablet Take 1 tablet (25 mg total) by mouth daily. 07/21/19  Yes Hessie Knows, MD  folic acid (FOLVITE) 1 MG tablet Take 1 tablet (1 mg total) by mouth daily. 09/05/19  Yes Earlie Server, MD  furosemide (LASIX) 20 MG tablet Take 20 mg by mouth daily.  05/03/18  Yes [provider]  gabapentin (NEURONTIN) 100 MG capsule Take 100 mg by mouth 3 (three) times daily.    Yes [provider]  lisinopril (ZESTRIL) 20 MG tablet Take 20 mg by mouth daily. 05/13/19  Yes [provider]  meclizine (ANTIVERT) 12.5 MG tablet Take 12.5 mg by mouth 3 (three) times daily as needed for dizziness.   Yes [provider]  metFORMIN (GLUCOPHAGE) 500 MG tablet Take 500 mg by mouth daily.    Yes [provider]  [facility] DELICA LANCETS 67M MISC 3 daily   Yes [provider]  pantoprazole (PROTONIX) 40 MG tablet Take 40 mg by mouth 2 (two) times daily. 06/06/19  Yes [provider]  potassium chloride (K-DUR) 10 MEQ tablet TAKE 1 TABLET (10 MEQ TOTAL) BY MOUTH ONCE DAILY 05/03/18  Yes [provider]  Vitamin D, Ergocalciferol, (DRISDOL) 1.25 MG (50000 UNIT) CAPS capsule Take 50,000 Units by mouth every 7 (seven) days.   Yes [provider]    Allergies as of 08/21/2021 - Review Complete 03/06/2021  Allergen Reaction Noted   Erythromycin ethylsuccinate [erythromycin] Other (See Comments) 10/02/2015    Family History  Problem Relation Age of Onset    Diabetes Mother    Cancer Mother        died in her 7s   Diabetes Father    Heart attack Father        died @ 10   Diabetes Sister     Social History   Socioeconomic History   Marital status: Divorced    Spouse name: Not on file   Number of children: Not on file   Years of education: Not on file   Highest education level: Not on file  Occupational History   Occupation: retired  Tobacco Use   Smoking status: Never   Smokeless tobacco: Never  Vaping Use   Vaping Use: Never used  Substance and Sexual Activity   Alcohol use: No   Drug use: No   Sexual activity: Never  Other Topics Concern   Not on file  Social History Narrative   Lives in [place] by herself.  Sister nearby.  Active.   Social Determinants of Health   Financial Resource Strain: Not on file  Food Insecurity: Not on file  Transportation Needs: Not on file  Physical Activity: Not on file  Stress: Not on file  Social Connections: Not on file  Intimate Partner Violence: Not on file    Review of Systems: See HPI, otherwise negative ROS  Physical Exam: BP (!) 151/54   Pulse (!) 55   Temp 97.9 F (36.6 C) (Temporal)  Resp 18   Wt 63 kg   SpO2 100%   BMI 26.26 kg/m  General:   Alert, cooperative in NAD Head:  Normocephalic and atraumatic. Respiratory:  Normal work of breathing. Cardiovascular:  RRR  Impression/Plan: Lisa Hernandez is here for cataract surgery.  Risks, benefits, limitations, and alternatives regarding cataract surgery have been reviewed with the patient.  Questions have been answered.  All parties agreeable.   Benay Pillow, MD  10/14/2021, 8:25 AM

## 2021-10-14 NOTE — Anesthesia Postprocedure Evaluation (Signed)
Anesthesia Post Note  Patient: Lisa Hernandez  Procedure(s) Performed: CATARACT EXTRACTION PHACO AND INTRAOCULAR LENS PLACEMENT ([facility]) LEFT DIABETIC (Left: Eye)     Patient location during evaluation: PACU Anesthesia Type: MAC Level of consciousness: awake and alert and oriented Pain management: satisfactory to patient Vital Signs Assessment: post-procedure vital signs reviewed and stable Respiratory status: spontaneous breathing, nonlabored ventilation and respiratory function stable Cardiovascular status: blood pressure returned to baseline and stable Postop Assessment: Adequate PO intake and No signs of nausea or vomiting Anesthetic complications: no   No notable events documented.  Raliegh Ip

## 2022-02-28 ENCOUNTER — Other Ambulatory Visit: Payer: Self-pay | Admitting: Nephrology

## 2022-02-28 DIAGNOSIS — N1832 Chronic kidney disease, stage 3b: Secondary | ICD-10-CM

## 2022-02-28 DIAGNOSIS — E1122 Type 2 diabetes mellitus with diabetic chronic kidney disease: Secondary | ICD-10-CM

## 2022-02-28 DIAGNOSIS — D631 Anemia in chronic kidney disease: Secondary | ICD-10-CM

## 2022-04-02 ENCOUNTER — Other Ambulatory Visit: Payer: Self-pay | Admitting: Nephrology

## 2022-04-02 DIAGNOSIS — N1832 Chronic kidney disease, stage 3b: Secondary | ICD-10-CM

## 2022-04-03 ENCOUNTER — Encounter: Payer: Self-pay | Admitting: Oncology

## 2022-04-06 ENCOUNTER — Ambulatory Visit
Admission: EM | Admit: 2022-04-06 | Discharge: 2022-04-06 | Disposition: A | Discharge status: Home / Self Care | Payer: Medicare PPO | Attending: Internal Medicine | Admitting: Internal Medicine

## 2022-04-06 ENCOUNTER — Other Ambulatory Visit: Payer: Self-pay

## 2022-04-06 ENCOUNTER — Encounter: Payer: Self-pay | Admitting: Emergency Medicine

## 2022-04-06 DIAGNOSIS — Z20822 Contact with and (suspected) exposure to covid-19: Secondary | ICD-10-CM | POA: Diagnosis not present

## 2022-04-06 DIAGNOSIS — J069 Acute upper respiratory infection, unspecified: Secondary | ICD-10-CM

## 2022-04-06 DIAGNOSIS — R059 Cough, unspecified: Secondary | ICD-10-CM | POA: Insufficient documentation

## 2022-04-06 LAB — RESP PANEL BY RT-PCR (FLU A&B, COVID) ARPGX2
Influenza A by PCR: NEGATIVE
Influenza B by PCR: NEGATIVE
SARS Coronavirus 2 by RT PCR: NEGATIVE

## 2022-04-06 MED ORDER — BENZONATATE 100 MG PO CAPS: 100.0000 mg | ORAL_CAPSULE | Freq: Three times a day (TID) | ORAL | 0 refills | Status: DC | PRN

## 2022-04-06 MED ORDER — LORATADINE 10 MG PO TABS: 10.0000 mg | ORAL_TABLET | Freq: Every day | ORAL | 0 refills | Status: DC

## 2022-04-06 NOTE — Discharge Instructions (Signed)
You flu tests and covid test are negative ?You just have a simple cold which could last a couple of weeks. If you develop worse cough and temperature of 100.5 or more, make sure to be re-examined again.  ?

## 2022-04-06 NOTE — ED Provider Notes (Signed)
?[facility] ? ? ? ?CSN: [id number] ?Arrival date & time: 04/06/22  4098 ? ? ?  ? ?History   ?Chief Complaint ?Chief Complaint  ?Patient presents with  ? Cough  ? ? ?HPI ?Lisa Hernandez is a 86 y.o. female who presents with hx of sneezing, cough and low grade temp  of 99.6 to 100 x 2 days. Has not been hungry. Denies exposure to anyone sick.  ? ? ? ?Past Medical History:  ?Diagnosis Date  ? Back pain   ? CRD (chronic renal disease)   ? Diabetes mellitus without complication ([place])   ? Dizziness   ? Dysphagia   ? Dyspnea   ? Esophageal stricture   ? a. s/p dilation x 7; b. 10/14/2017 EGD: benign appearing esophageal stenosis (severe) w/ adherent scar tissue in distal esophagus.  ? GERD (gastroesophageal reflux disease)   ? with esophagitis  ? History of bone density study   ? Hx of adenomatous colonic polyps   ? Hypertension   ? [place] grade C esophagitis   ? Osteoarthritis   ? knees  ? Rheumatoid arthritis ([place])   ? Thyroid nodule   ? Wears dentures   ? full upper and lower  ? ? ?Patient Active Problem List  ? Diagnosis Date Noted  ? Pancytopenia ([place])   ? B12 deficiency   ? Symptomatic anemia 10/23/2017  ? Neutropenia ([place])   ? Thrombocytopenia ([place])   ? ? ?Past Surgical History:  ?Procedure Laterality Date  ? ABDOMINAL HYSTERECTOMY    ? adenomatous colon polyps  08/25/2016  ? CATARACT EXTRACTION W/PHACO Right 09/30/2021  ? Procedure: CATARACT EXTRACTION PHACO AND INTRAOCULAR LENS PLACEMENT ([place]) RIGHT DIABETIC;  Surgeon: Eulogio Bear, MD;  Location: [facility];  Service: Ophthalmology;  Laterality: Right;  4.45 ?0:29.3  ? CATARACT EXTRACTION W/PHACO Left 10/14/2021  ? Procedure: CATARACT EXTRACTION PHACO AND INTRAOCULAR LENS PLACEMENT ([place]) LEFT DIABETIC;  Surgeon: Eulogio Bear, MD;  Location: [place];  Service: Ophthalmology;  Laterality: Left;  Diabetic ?4.97 ?00:34.0  ? CHOLECYSTECTOMY    ? COLONOSCOPY    ? ESOPHAGOGASTRODUODENOSCOPY N/A 01/14/2019  ? Procedure:  ESOPHAGOGASTRODUODENOSCOPY (EGD);  Surgeon: Manya Silvas, MD;  Location: [facility] ENDOSCOPY;  Service: Endoscopy;  Laterality: N/A;  ? ESOPHAGOGASTRODUODENOSCOPY (EGD) WITH PROPOFOL N/A 10/03/2015  ? Procedure: ESOPHAGOGASTRODUODENOSCOPY (EGD) WITH PROPOFOL;  Surgeon: Manya Silvas, MD;  Location: [facility] ENDOSCOPY;  Service: Endoscopy;  Laterality: N/A;  ? ESOPHAGOGASTRODUODENOSCOPY (EGD) WITH PROPOFOL N/A 10/25/2015  ? Procedure: ESOPHAGOGASTRODUODENOSCOPY (EGD) WITH PROPOFOL;  Surgeon: Manya Silvas, MD;  Location: [facility] ENDOSCOPY;  Service: Endoscopy;  Laterality: N/A;   IDDM  ? ESOPHAGOGASTRODUODENOSCOPY (EGD) WITH PROPOFOL N/A 10/27/2016  ? Procedure: ESOPHAGOGASTRODUODENOSCOPY (EGD) WITH PROPOFOL;  Surgeon: Manya Silvas, MD;  Location: [facility] ENDOSCOPY;  Service: Endoscopy;  Laterality: N/A;  Diabetic  ? ESOPHAGOGASTRODUODENOSCOPY (EGD) WITH PROPOFOL N/A 10/14/2017  ? Procedure: ESOPHAGOGASTRODUODENOSCOPY (EGD) WITH PROPOFOL;  Surgeon: Manya Silvas, MD;  Location: [facility] ENDOSCOPY;  Service: Endoscopy;  Laterality: N/A;  ? ESOPHAGOGASTRODUODENOSCOPY (EGD) WITH PROPOFOL N/A 11/13/2017  ? Procedure: ESOPHAGOGASTRODUODENOSCOPY (EGD) WITH PROPOFOL;  Surgeon: Manya Silvas, MD;  Location: [facility] ENDOSCOPY;  Service: Endoscopy;  Laterality: N/A;  ? ESOPHAGOGASTRODUODENOSCOPY (EGD) WITH PROPOFOL N/A 05/24/2018  ? Procedure: ESOPHAGOGASTRODUODENOSCOPY (EGD) WITH PROPOFOL;  Surgeon: Manya Silvas, MD;  Location: [facility] ENDOSCOPY;  Service: Endoscopy;  Laterality: N/A;  ? ESOPHAGOGASTRODUODENOSCOPY (EGD) WITH PROPOFOL N/A 08/29/2019  ? Procedure: ESOPHAGOGASTRODUODENOSCOPY (EGD) WITH PROPOFOL;  Surgeon: Burgin, Pierre Part,  MD;  Location: [facility] ENDOSCOPY;  Service: Gastroenterology;  Laterality: N/A;  ? ESOPHAGOGASTRODUODENOSCOPY (EGD) WITH PROPOFOL N/A 02/06/2020  ? Procedure: ESOPHAGOGASTRODUODENOSCOPY (EGD) WITH PROPOFOL;  Surgeon: Toledo, Benay Pike, MD;  Location: [facility] ENDOSCOPY;  Service: Gastroenterology;   Laterality: N/A;  ? ESOPHAGOGASTRODUODENOSCOPY (EGD) WITH PROPOFOL N/A 03/22/2020  ? Procedure: ESOPHAGOGASTRODUODENOSCOPY (EGD) WITH PROPOFOL;  Surgeon: Toledo, Benay Pike, MD;  Location: [facility] ENDOSCOPY;  Service: Gastroenterology;  Laterality: N/A;  ? ESOPHAGOGASTRODUODENOSCOPY (EGD) WITH PROPOFOL N/A 03/06/2021  ? Procedure: ESOPHAGOGASTRODUODENOSCOPY (EGD) WITH PROPOFOL;  Surgeon: Toledo, Benay Pike, MD;  Location: [facility] ENDOSCOPY;  Service: Gastroenterology;  Laterality: N/A;  ? KYPHOPLASTY N/A 07/21/2019  ? Procedure: L1 KYPHOPLASTY;  Surgeon: Hessie Knows, MD;  Location: [facility] ORS;  Service: Orthopedics;  Laterality: N/A;  ? SAVORY DILATION N/A 10/03/2015  ? Procedure: SAVORY DILATION;  Surgeon: Manya Silvas, MD;  Location: [facility] ENDOSCOPY;  Service: Endoscopy;  Laterality: N/A;  ? SAVORY DILATION N/A 10/25/2015  ? Procedure: SAVORY DILATION;  Surgeon: Manya Silvas, MD;  Location: [facility] ENDOSCOPY;  Service: Endoscopy;  Laterality: N/A;  ? TONSILLECTOMY    ? ? ?OB History   ?No obstetric history on file. ?  ? ? ? ?Home Medications   ? ?Prior to Admission medications   ?Medication Sig Start Date End Date Taking? Authorizing Provider  ?atenolol (TENORMIN) 25 MG tablet Take 1 tablet (25 mg total) by mouth daily. 07/21/19   Hessie Knows, MD  ?folic acid (FOLVITE) 1 MG tablet Take 1 tablet (1 mg total) by mouth daily. 09/05/19   Earlie Server, MD  ?furosemide (LASIX) 20 MG tablet Take 20 mg by mouth daily.  05/03/18   [provider]  ?gabapentin (NEURONTIN) 100 MG capsule Take 100 mg by mouth 3 (three) times daily.     [provider]  ?lisinopril (ZESTRIL) 20 MG tablet Take 20 mg by mouth daily. 05/13/19   [provider]  ?meclizine (ANTIVERT) 12.5 MG tablet Take 12.5 mg by mouth 3 (three) times daily as needed for dizziness.    [provider]  ?metFORMIN (GLUCOPHAGE) 500 MG tablet Take 500 mg by mouth daily.     [provider]  ?Jonetta Speak LANCETS 72Z MISC 3 daily     [provider]  ?pantoprazole (PROTONIX) 40 MG tablet Take 40 mg by mouth 2 (two) times daily. 06/06/19   [provider]  ?potassium chloride (K-DUR) 10 MEQ tablet TAKE 1 TABLET (10 MEQ TOTAL) BY MOUTH ONCE DAILY 05/03/18   [provider]  ?Vitamin D, Ergocalciferol, (DRISDOL) 1.25 MG (50000 UNIT) CAPS capsule Take 50,000 Units by mouth every 7 (seven) days.    [provider]  ? ? ?Family History ?Family History  ?Problem Relation Age of Onset  ? Diabetes Mother   ? Cancer Mother   ?     died in her 85s  ? Diabetes Father   ? Heart attack Father   ?     died @ 92  ? Diabetes Sister   ? ? ?Social History ?Social History  ? ?Tobacco Use  ? Smoking status: Never  ? Smokeless tobacco: Never  ?Vaping Use  ? Vaping Use: Never used  ?Substance Use Topics  ? Alcohol use: No  ? Drug use: No  ? ? ? ?Allergies   ?Erythromycin ethylsuccinate [erythromycin] ? ? ?Review of Systems ?Review of Systems  ?Constitutional:  Positive for appetite change and fatigue. Negative for fever.  ?HENT:  Positive for postnasal drip, rhinorrhea and sneezing. Negative  for congestion, ear discharge, ear pain, sore throat and trouble swallowing.   ?Respiratory:  Positive for cough. Negative for shortness of breath and wheezing.   ?Musculoskeletal:  Negative for myalgias.  ?Neurological:  Negative for headaches.  ?Hematological:  Negative for adenopathy.  ? ? ?Physical Exam ?Triage Vital Signs ?ED Triage Vitals  ?Enc Vitals Group  ?   BP 04/06/22 1048 (!) 146/60  ?   Pulse Rate 04/06/22 1048 76  ?   Resp 04/06/22 1048 14  ?   Temp 04/06/22 1048 99.6 ?F (37.6 ?C)  ?   Temp Source 04/06/22 1048 Oral  ?   SpO2 04/06/22 1048 100 %  ?   Weight 04/06/22 1045 135 lb (61.2 kg)  ?   Height 04/06/22 1045 '5\' 1"'$  (1.549 m)  ?   Head Circumference --   ?   Peak Flow --   ?   Pain Score 04/06/22 1045 0  ?   Pain Loc --   ?   Pain Edu? --   ?   Excl. in [place]? --   ? ?No data found. ? ?Updated Vital Signs ?BP (!) 146/60 (BP  Location: Left Arm)   Pulse 76   Temp 99.6 ?F (37.6 ?C) (Oral)   Resp 14   Ht '5\' 1"'$  (1.549 m)   Wt 135 lb (61.2 kg)   SpO2 100%   BMI 25.51 kg/m?  ? ?Visual Acuity ?Right Eye Distance:   ?Left Eye Distance:   ?[place]

## 2022-04-06 NOTE — ED Triage Notes (Signed)
Patient c/o sneezing, cough, low grade fever that started on Thursday.   ?

## 2022-04-07 ENCOUNTER — Ambulatory Visit
Admission: RE | Admit: 2022-04-07 | Discharge: 2022-04-07 | Disposition: A | Discharge status: Home / Self Care | Payer: Medicare PPO | Source: Ambulatory Visit | Attending: Nephrology | Admitting: Nephrology

## 2022-04-07 DIAGNOSIS — N1832 Chronic kidney disease, stage 3b: Secondary | ICD-10-CM | POA: Insufficient documentation

## 2022-05-10 ENCOUNTER — Ambulatory Visit: Payer: Medicare PPO

## 2022-05-10 ENCOUNTER — Ambulatory Visit (INDEPENDENT_AMBULATORY_CARE_PROVIDER_SITE_OTHER): Payer: Medicare PPO

## 2022-05-10 ENCOUNTER — Other Ambulatory Visit: Payer: Self-pay

## 2022-05-10 ENCOUNTER — Ambulatory Visit
Admission: EM | Admit: 2022-05-10 | Discharge: 2022-05-10 | Disposition: A | Discharge status: Home / Self Care | Payer: Medicare PPO | Attending: Physician Assistant | Admitting: Physician Assistant

## 2022-05-10 DIAGNOSIS — R0782 Intercostal pain: Secondary | ICD-10-CM

## 2022-05-10 DIAGNOSIS — R0781 Pleurodynia: Secondary | ICD-10-CM | POA: Diagnosis not present

## 2022-05-10 DIAGNOSIS — S20211A Contusion of right front wall of thorax, initial encounter: Secondary | ICD-10-CM

## 2022-05-10 DIAGNOSIS — S0083XA Contusion of other part of head, initial encounter: Secondary | ICD-10-CM | POA: Diagnosis not present

## 2022-05-10 DIAGNOSIS — W19XXXA Unspecified fall, initial encounter: Secondary | ICD-10-CM | POA: Diagnosis not present

## 2022-05-10 NOTE — Discharge Instructions (Signed)
-  Your rib and chest x-rays did not show any fractures or concerning abnormalities.  You likely just bruised your ribs. - Ice the ribs every couple of hours and take extra strength Tylenol.  Make sure you are breathing well and taking deep breaths every now and again so that you do not have any collapsed lower lung sacs which could be due to pneumonia. - Ice your facial contusions as well.  If you have any severe headaches, vision changes, vomiting, confusion, call 911 or have someone take you to the ER.  It has been 5 days since you had your fall so I do not think there is any intracranial bleeding or abnormality or you would have had more significant symptoms. - Follow-up with your PCP.

## 2022-05-10 NOTE — ED Triage Notes (Signed)
Pt present fall on Monday and has some bruising on both sides of the body. Pt states that she cannot lay on her right side because she has pain underneath her right side breast Area.

## 2022-05-10 NOTE — ED Provider Notes (Signed)
[facility]    CSN: [id number] Arrival date & time: 05/10/22  6644      History   Chief Complaint Chief Complaint  Patient presents with   Fall    HPI Lisa Hernandez is a 86 y.o. female presenting for right-sided rib pain and left orbital and facial pain following an accidental fall that occurred 5 days ago.  Patient says she was getting up off her bed and lost her balance when she was trying to stand up.  She says she thinks her right side hit her walker and she is unsure what her head hit.  Denies any loss of consciousness.  She says she is not really having any headaches or significant pain around the left orbit.  She is primarily concerned about her right-sided rib pain and thinks she could have a fracture.  She admits to increased pain when moving.  No significant pain on breathing.  Denies any shortness of breath or cough.  She has not had any dizziness, weakness, nausea/vomiting, confusion.  Has not taken anything for pain relief.  Has applied ice to her face.  Her medical history is significant for hypertension, rheumatoid arthritis, diabetes, renal disease.  She is not taking any anticoagulants.  No other complaints today.  HPI  Past Medical History:  Diagnosis Date   Back pain    CRD (chronic renal disease)    Diabetes mellitus without complication (HCC)    Dizziness    Dysphagia    Dyspnea    Esophageal stricture    a. s/p dilation x 7; b. 10/14/2017 EGD: benign appearing esophageal stenosis (severe) w/ adherent scar tissue in distal esophagus.   GERD (gastroesophageal reflux disease)    with esophagitis   History of bone density study    Hx of adenomatous colonic polyps    Hypertension    [place] grade C esophagitis    Osteoarthritis    knees   Rheumatoid arthritis (HCC)    Thyroid nodule    Wears dentures    full upper and lower    Patient Active Problem List   Diagnosis Date Noted   Pancytopenia ([place])    B12 deficiency    Symptomatic anemia  10/23/2017   Neutropenia (HCC)    Thrombocytopenia ([place])     Past Surgical History:  Procedure Laterality Date   ABDOMINAL HYSTERECTOMY     adenomatous colon polyps  08/25/2016   CATARACT EXTRACTION W/PHACO Right 09/30/2021   Procedure: CATARACT EXTRACTION PHACO AND INTRAOCULAR LENS PLACEMENT ([place]) RIGHT DIABETIC;  Surgeon: Eulogio Bear, MD;  Location: [place];  Service: Ophthalmology;  Laterality: Right;  4.45 0:29.3   CATARACT EXTRACTION W/PHACO Left 10/14/2021   Procedure: CATARACT EXTRACTION PHACO AND INTRAOCULAR LENS PLACEMENT ([place]) LEFT DIABETIC;  Surgeon: Eulogio Bear, MD;  Location: [place];  Service: Ophthalmology;  Laterality: Left;  Diabetic 4.97 00:34.0   CHOLECYSTECTOMY     COLONOSCOPY     ESOPHAGOGASTRODUODENOSCOPY N/A 01/14/2019   Procedure: ESOPHAGOGASTRODUODENOSCOPY (EGD);  Surgeon: Manya Silvas, MD;  Location: [facility] ENDOSCOPY;  Service: Endoscopy;  Laterality: N/A;   ESOPHAGOGASTRODUODENOSCOPY (EGD) WITH PROPOFOL N/A 10/03/2015   Procedure: ESOPHAGOGASTRODUODENOSCOPY (EGD) WITH PROPOFOL;  Surgeon: Manya Silvas, MD;  Location: [facility] ENDOSCOPY;  Service: Endoscopy;  Laterality: N/A;   ESOPHAGOGASTRODUODENOSCOPY (EGD) WITH PROPOFOL N/A 10/25/2015   Procedure: ESOPHAGOGASTRODUODENOSCOPY (EGD) WITH PROPOFOL;  Surgeon: Manya Silvas, MD;  Location: [facility] ENDOSCOPY;  Service: Endoscopy;  Laterality: N/A;   IDDM   ESOPHAGOGASTRODUODENOSCOPY (  EGD) WITH PROPOFOL N/A 10/27/2016   Procedure: ESOPHAGOGASTRODUODENOSCOPY (EGD) WITH PROPOFOL;  Surgeon: Manya Silvas, MD;  Location: [facility] ENDOSCOPY;  Service: Endoscopy;  Laterality: N/A;  Diabetic   ESOPHAGOGASTRODUODENOSCOPY (EGD) WITH PROPOFOL N/A 10/14/2017   Procedure: ESOPHAGOGASTRODUODENOSCOPY (EGD) WITH PROPOFOL;  Surgeon: Manya Silvas, MD;  Location: [facility] ENDOSCOPY;  Service: Endoscopy;  Laterality: N/A;   ESOPHAGOGASTRODUODENOSCOPY (EGD) WITH PROPOFOL N/A 11/13/2017    Procedure: ESOPHAGOGASTRODUODENOSCOPY (EGD) WITH PROPOFOL;  Surgeon: Manya Silvas, MD;  Location: [facility] ENDOSCOPY;  Service: Endoscopy;  Laterality: N/A;   ESOPHAGOGASTRODUODENOSCOPY (EGD) WITH PROPOFOL N/A 05/24/2018   Procedure: ESOPHAGOGASTRODUODENOSCOPY (EGD) WITH PROPOFOL;  Surgeon: Manya Silvas, MD;  Location: [facility] ENDOSCOPY;  Service: Endoscopy;  Laterality: N/A;   ESOPHAGOGASTRODUODENOSCOPY (EGD) WITH PROPOFOL N/A 08/29/2019   Procedure: ESOPHAGOGASTRODUODENOSCOPY (EGD) WITH PROPOFOL;  Surgeon: Toledo, Benay Pike, MD;  Location: [facility] ENDOSCOPY;  Service: Gastroenterology;  Laterality: N/A;   ESOPHAGOGASTRODUODENOSCOPY (EGD) WITH PROPOFOL N/A 02/06/2020   Procedure: ESOPHAGOGASTRODUODENOSCOPY (EGD) WITH PROPOFOL;  Surgeon: Toledo, Benay Pike, MD;  Location: [facility] ENDOSCOPY;  Service: Gastroenterology;  Laterality: N/A;   ESOPHAGOGASTRODUODENOSCOPY (EGD) WITH PROPOFOL N/A 03/22/2020   Procedure: ESOPHAGOGASTRODUODENOSCOPY (EGD) WITH PROPOFOL;  Surgeon: Toledo, Benay Pike, MD;  Location: [facility] ENDOSCOPY;  Service: Gastroenterology;  Laterality: N/A;   ESOPHAGOGASTRODUODENOSCOPY (EGD) WITH PROPOFOL N/A 03/06/2021   Procedure: ESOPHAGOGASTRODUODENOSCOPY (EGD) WITH PROPOFOL;  Surgeon: Toledo, Benay Pike, MD;  Location: [facility] ENDOSCOPY;  Service: Gastroenterology;  Laterality: N/A;   KYPHOPLASTY N/A 07/21/2019   Procedure: L1 KYPHOPLASTY;  Surgeon: Hessie Knows, MD;  Location: [facility] ORS;  Service: Orthopedics;  Laterality: N/A;   SAVORY DILATION N/A 10/03/2015   Procedure: SAVORY DILATION;  Surgeon: Manya Silvas, MD;  Location: [facility] ENDOSCOPY;  Service: Endoscopy;  Laterality: N/A;   SAVORY DILATION N/A 10/25/2015   Procedure: SAVORY DILATION;  Surgeon: Manya Silvas, MD;  Location: [facility] ENDOSCOPY;  Service: Endoscopy;  Laterality: N/A;   TONSILLECTOMY      OB History   No obstetric history on file.      Home Medications    Prior to Admission medications   Medication Sig Start Date End  Date Taking? Authorizing Provider  atenolol (TENORMIN) 25 MG tablet Take 1 tablet (25 mg total) by mouth daily. 07/21/19   Hessie Knows, MD  benzonatate (TESSALON PERLES) 100 MG capsule Take 1 capsule (100 mg total) by mouth 3 (three) times daily as needed for cough. 04/06/22   Rodriguez-Southworth, Sunday Spillers, PA-C  folic acid (FOLVITE) 1 MG tablet Take 1 tablet (1 mg total) by mouth daily. 09/05/19   Earlie Server, MD  furosemide (LASIX) 20 MG tablet Take 20 mg by mouth daily.  05/03/18   [provider]  gabapentin (NEURONTIN) 100 MG capsule Take 100 mg by mouth 3 (three) times daily.     [provider]  lisinopril (ZESTRIL) 20 MG tablet Take 20 mg by mouth daily. 05/13/19   [provider]  loratadine (CLARITIN) 10 MG tablet Take 1 tablet (10 mg total) by mouth daily. 04/06/22   Rodriguez-Southworth, Sunday Spillers, PA-C  meclizine (ANTIVERT) 12.5 MG tablet Take 12.5 mg by mouth 3 (three) times daily as needed for dizziness.    [provider]  Jonetta Speak LANCETS 70J MISC 3 daily    [provider]  pantoprazole (PROTONIX) 40 MG tablet Take 40 mg by mouth 2 (two) times daily. 06/06/19   [provider]  potassium chloride (K-DUR) 10 MEQ tablet TAKE 1 TABLET (10 MEQ TOTAL) BY MOUTH ONCE DAILY 05/03/18  [provider]  Vitamin D, Ergocalciferol, (DRISDOL) 1.25 MG (50000 UNIT) CAPS capsule Take 50,000 Units by mouth every 7 (seven) days.    [provider]    Family History Family History  Problem Relation Age of Onset   Diabetes Mother    Cancer Mother        died in her 44s   Diabetes Father    Heart attack Father        died @ 27   Diabetes Sister     Social History Social History   Tobacco Use   Smoking status: Never   Smokeless tobacco: Never  Vaping Use   Vaping Use: Never used  Substance Use Topics   Alcohol use: No   Drug use: No     Allergies   Erythromycin ethylsuccinate [erythromycin]   Review of  Systems Review of Systems  Constitutional:  Negative for fatigue.  HENT:  Positive for facial swelling.   Eyes:  Negative for photophobia.  Respiratory:  Negative for cough and shortness of breath.   Cardiovascular:  Positive for chest pain (right rib pain).  Gastrointestinal:  Negative for nausea and vomiting.  Musculoskeletal:  Negative for back pain, gait problem, neck pain and neck stiffness.  Skin:  Positive for color change. Negative for wound.  Neurological:  Negative for dizziness, syncope, weakness, numbness and headaches.  Psychiatric/Behavioral:  Negative for confusion.     Physical Exam Triage Vital Signs ED Triage Vitals  Enc Vitals Group     BP 05/10/22 0838 (!) 149/51     Pulse Rate 05/10/22 0838 (!) 54     Resp 05/10/22 0838 18     Temp 05/10/22 0838 97.9 F (36.6 C)     Temp Source 05/10/22 0838 Oral     SpO2 05/10/22 0838 100 %     Weight --      Height --      Head Circumference --      Peak Flow --      Pain Score 05/10/22 0839 10     Pain Loc --      Pain Edu? --      Excl. in [place]? --    No data found.  Updated Vital Signs BP (!) 149/51 (BP Location: Left Arm)   Pulse (!) 54   Temp 97.9 F (36.6 C) (Oral)   Resp 18   SpO2 100%   Physical Exam Vitals and nursing note reviewed.  Constitutional:      General: She is not in acute distress.    Appearance: Normal appearance. She is not ill-appearing or toxic-appearing.  HENT:     Head:     Comments: Bluish-purple and yellowish contusions about the left orbit with mild associated swelling of the left maxilla.  Tenderness to palpation of the left maxilla but no step-offs noted.    Nose: Nose normal.     Mouth/Throat:     Mouth: Mucous membranes are moist.     Pharynx: Oropharynx is clear.  Eyes:     General: No scleral icterus.       Right eye: No discharge.        Left eye: No discharge.     Extraocular Movements: Extraocular movements intact.     Conjunctiva/sclera: Conjunctivae normal.      Pupils: Pupils are equal, round, and reactive to light.  Cardiovascular:     Rate and Rhythm: Regular rhythm. Bradycardia present.     Heart sounds: Normal heart sounds.  Pulmonary:     Effort: Pulmonary effort is normal. No respiratory distress.     Breath sounds: Normal breath sounds.  Chest:     Chest wall: Tenderness (TTP right ribs 6-8, laterally. Contusions of left lateral thorax) present.  Musculoskeletal:     Cervical back: Neck supple.  Skin:    General: Skin is dry.  Neurological:     General: No focal deficit present.     Mental Status: She is alert and oriented to person, place, and time. Mental status is at baseline.     Motor: No weakness.     Coordination: Coordination normal.     Gait: Gait abnormal (uses cane).  Psychiatric:        Mood and Affect: Mood normal.        Behavior: Behavior normal.        Thought Content: Thought content normal.     UC Treatments / Results  Labs (all labs ordered are listed, but only abnormal results are displayed) Labs Reviewed - No data to display  EKG   Radiology DG Ribs Unilateral W/Chest Right  Result Date: 05/10/2022 CLINICAL DATA:  Right rib pain, fall. EXAM: RIGHT RIBS AND CHEST - 3+ VIEW COMPARISON:  None Available. FINDINGS: No fracture or other bone lesions are seen involving the ribs. Vertebroplasty of L1 vertebral body. There is no evidence of pneumothorax or pleural effusion. Both lungs are clear. Heart size and mediastinal contours are within normal limits. IMPRESSION: No acute displaced fracture.  No acute pulmonary process. Electronically Signed   By: Keane Police D.O.   On: 05/10/2022 09:37    Procedures Procedures (including critical care time)  Medications Ordered in UC Medications - No data to display  Initial Impression / Assessment and Plan / UC Course  I have reviewed the triage vital signs and the nursing notes.  Pertinent labs & imaging results that were available during my care of the patient  were reviewed by me and considered in my medical decision making (see chart for details).  86 year old female presenting for complaints of right-sided rib pain and left orbital/facial pain following an accidental fall that occurred 5 days ago.  She says yesterday her right rib pain got worse and she became afraid of a fracture.  She denies any shortness of breath.  Patient denies any loss of consciousness and has not had any dizziness, weakness, vomiting, nausea, confusion, numbness/tingling and no other falls.  Has not taken anything for pain relief.  Has applied ice to her face.  She is overall well-appearing.  Is walking with a walker.  She does have mild swelling and contusions around the left orbit and some left maxillary tenderness.  Additionally she has contusions of the right lateral thorax and tenderness palpation about the ribs 6 through 8 laterally.  Her chest is clear to auscultation heart regular rhythm, but mildly bradycardic.  X-ray obtained of right ribs and chest.  X-ray does not reveal any fractures.  I discussed this with patient.  Suspect her pain is likely due to the contusions.  Reviewed RICE guidelines and advised her to start taking extra strength Tylenol.  Advised to return or go to ER for any acute worsening of symptoms especially if she were to develop any severe headaches, weakness, vomiting, dizziness, confusion, etc. or if she were to have any shortness of breath or increased rib/chest pain.  Patient is agreeable to plan.   Final Clinical Impressions(s) / UC Diagnoses   Final diagnoses:  Rib  pain on right side  Contusion of rib on right side, initial encounter  Contusion of face, initial encounter  Fall, initial encounter     Discharge Instructions      -Your rib and chest x-rays did not show any fractures or concerning abnormalities.  You likely just bruised your ribs. - Ice the ribs every couple of hours and take extra strength Tylenol.  Make sure you are  breathing well and taking deep breaths every now and again so that you do not have any collapsed lower lung sacs which could be due to pneumonia. - Ice your facial contusions as well.  If you have any severe headaches, vision changes, vomiting, confusion, call 911 or have someone take you to the ER.  It has been 5 days since you had your fall so I do not think there is any intracranial bleeding or abnormality or you would have had more significant symptoms. - Follow-up with your PCP.     ED Prescriptions   None    PDMP not reviewed this encounter.   Danton Clap, PA-C 05/10/22 1001

## 2022-11-11 ENCOUNTER — Encounter: Payer: Self-pay | Admitting: Oncology

## 2022-11-11 IMAGING — CR DG RIBS W/ CHEST 3+V*R*
5 series · 5 of 5 positions shown · non-contrast
Comparison: None Available.

CLINICAL DATA: Right rib pain, fall.

EXAM:
RIGHT RIBS AND CHEST - 3+ VIEW

[chest pa]
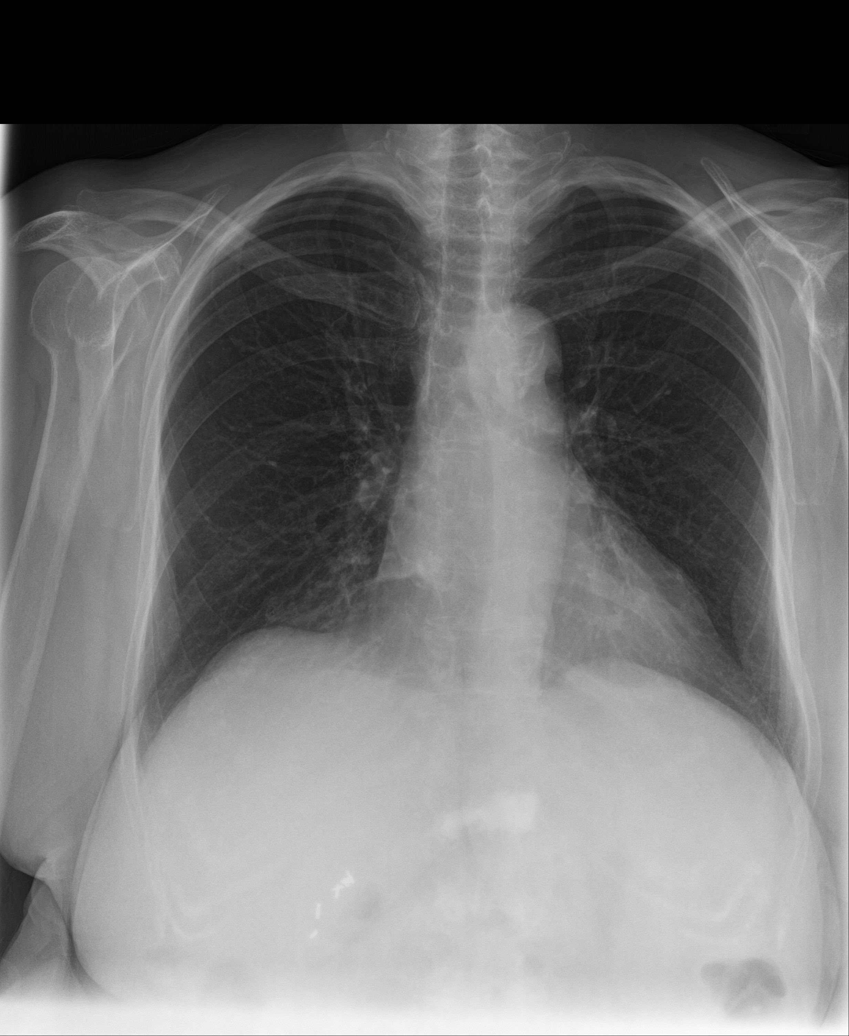

[rib pa (1 of 2)]
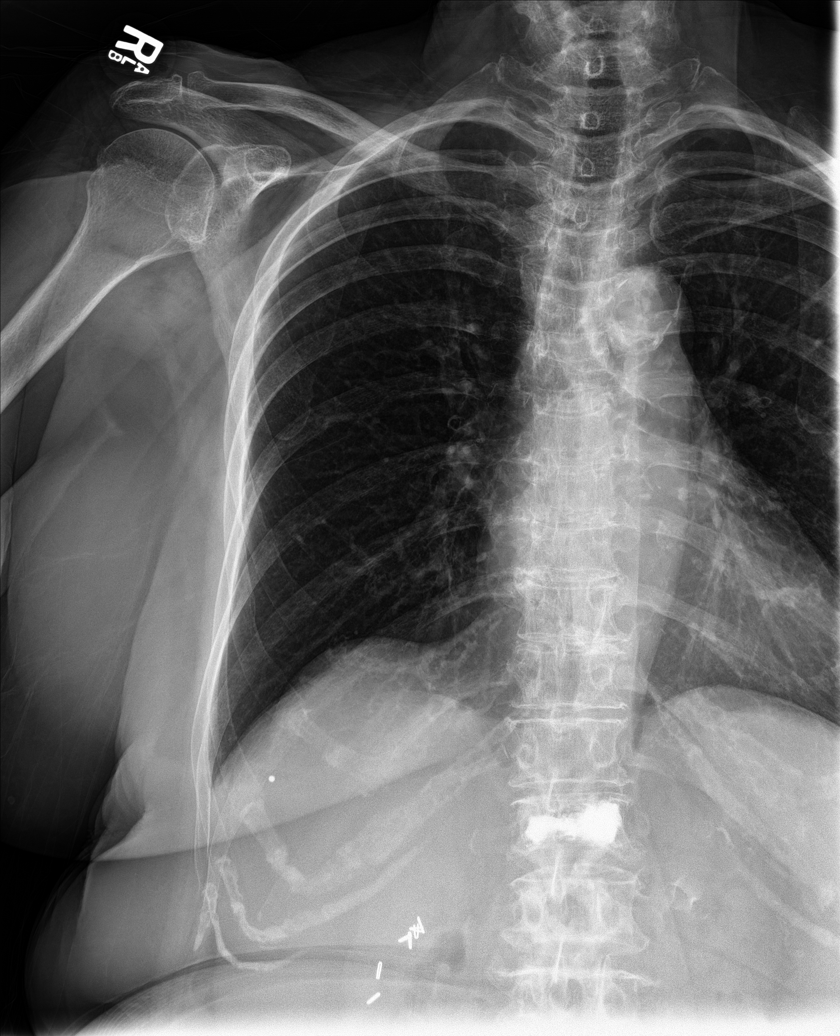

[rib pa (2 of 2)]
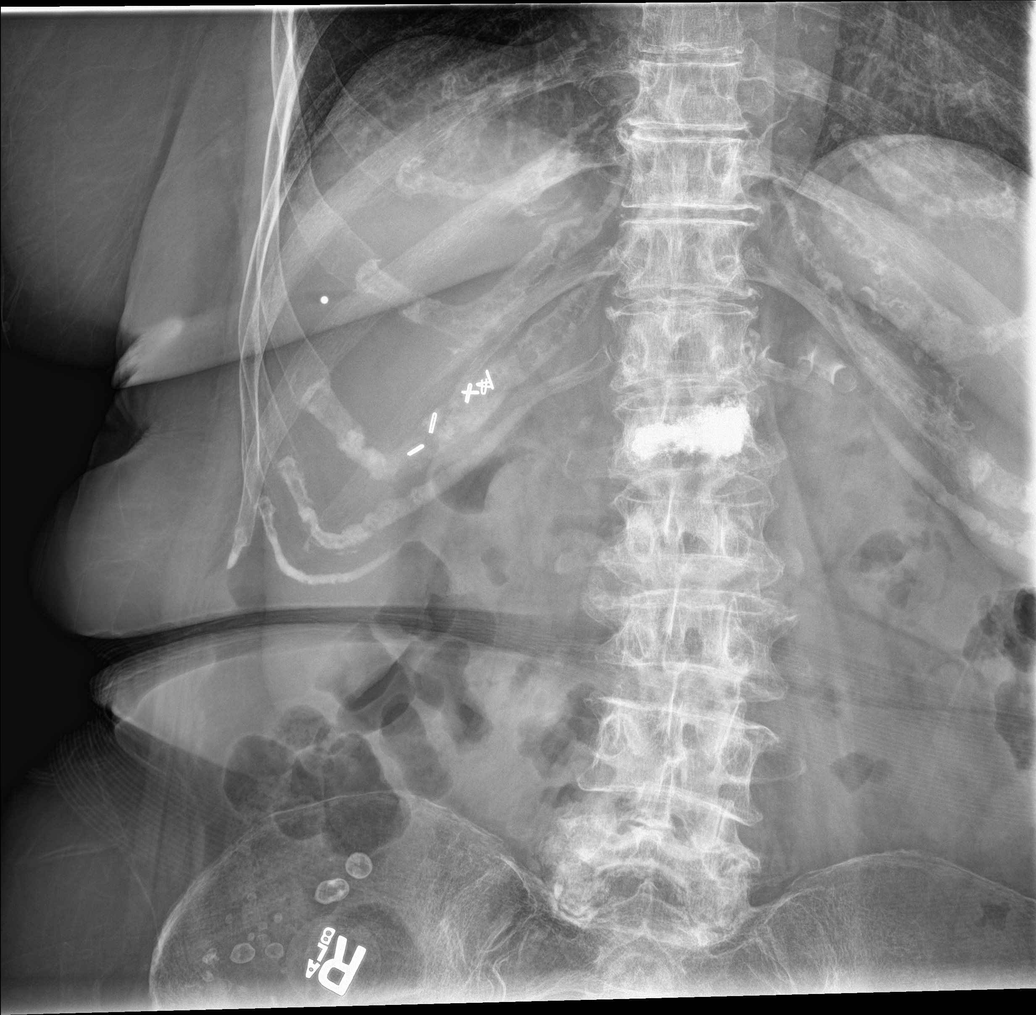

[rib obl (1 of 2)]
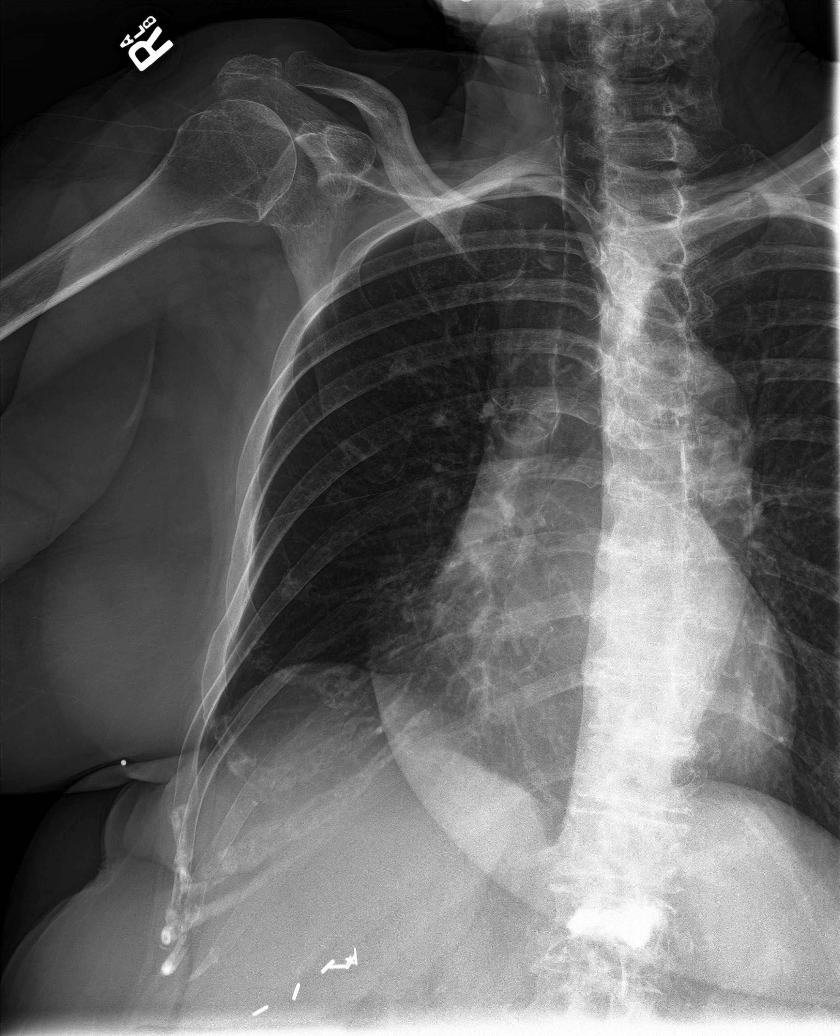

[rib obl (2 of 2)]
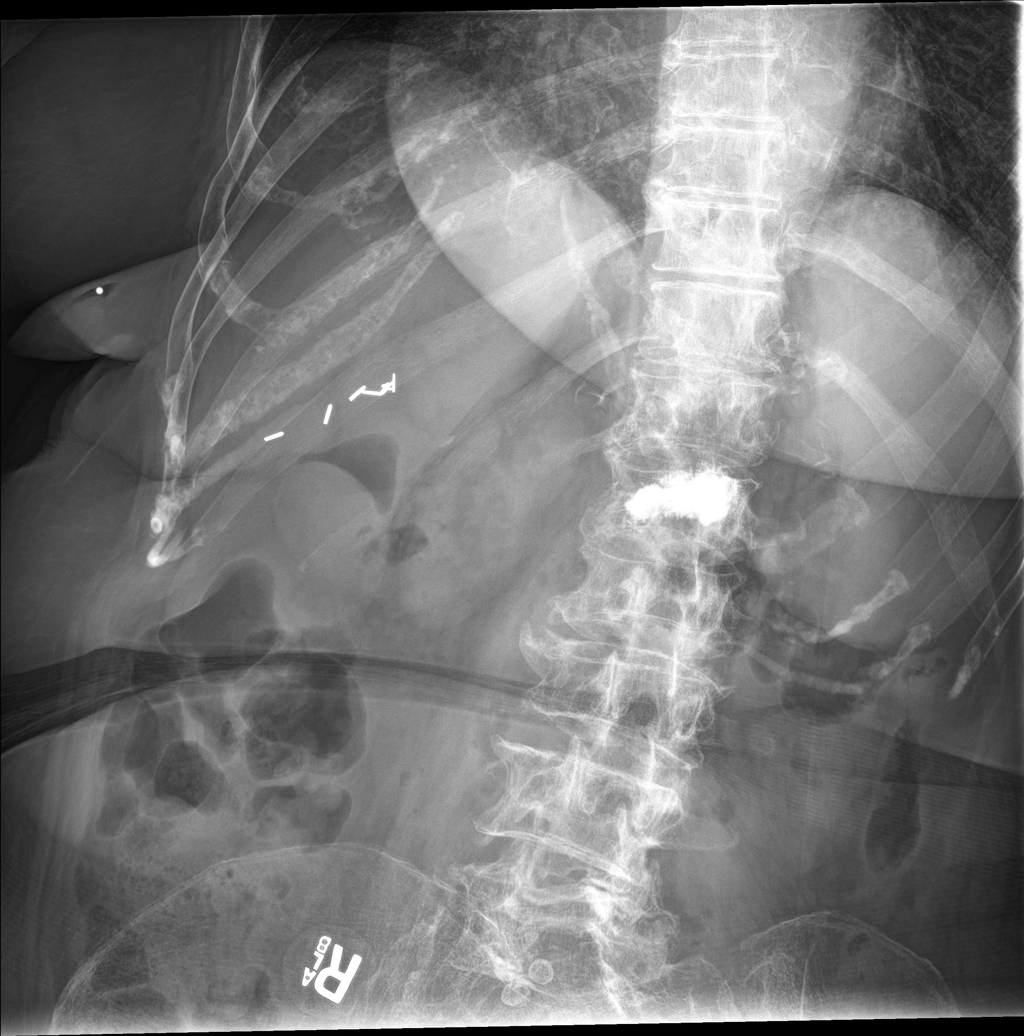

[5 of 5 positions shown; findings below may reference images not displayed]

FINDINGS: No fracture or other bone lesions are seen involving the ribs.
Vertebroplasty of L1 vertebral body. There is no evidence of
pneumothorax or pleural effusion. Both lungs are clear. Heart size
and mediastinal contours are within normal limits.
IMPRESSION: No acute displaced fracture.  No acute pulmonary process.

## 2022-11-17 ENCOUNTER — Ambulatory Visit (LOCAL_COMMUNITY_HEALTH_CENTER): Payer: Medicare Other

## 2022-11-17 DIAGNOSIS — Z719 Counseling, unspecified: Secondary | ICD-10-CM

## 2022-11-17 DIAGNOSIS — Z23 Encounter for immunization: Secondary | ICD-10-CM

## 2022-11-17 NOTE — Progress Notes (Signed)
  Are you feeling sick today? No   Have you ever received a dose of COVID-19 Vaccine? [employer], [place], [place], [place], Other) Yes  If yes, which vaccine and how many doses?   3 doses Pfizer   Did you bring the vaccination record card or other documentation?  Yes   Do you have a health condition or are undergoing treatment that makes you moderately or severely immunocompromised? This would include, but not be limited to: cancer, HIV, organ transplant, immunosuppressive therapy/high-dose corticosteroids, or moderate/severe primary immunodeficiency.  No  Have you received COVID-19 vaccine before or during hematopoietic cell transplant (HCT) or CAR-T-cell therapies? No  Have you ever had an allergic reaction to: (This would include a severe allergic reaction or a reaction that caused hives, swelling, or respiratory distress, including wheezing.) A component of a COVID-19 vaccine or a previous dose of COVID-19 vaccine? No   Have you ever had an allergic reaction to another vaccine (other thanCOVID-19 vaccine) or an injectable medication? (This would include a severe allergic reaction or a reaction that caused hives, swelling, or respiratory distress, including wheezing.)   No    Do you have a history of any of the following:  Myocarditis or Pericarditis No  Dermal fillers:  No  Multisystem Inflammatory Syndrome (MIS-C or MIS-A)? No  COVID-19 disease within the past 3 months? No  Vaccinated with monkeypox vaccine in the last 4 weeks? No  Given IM in left deltoid.  Tolerated well. VIS provided. COVID card updated and [facility] updated and copy provided. Waited 15 minutes.

## 2022-12-03 ENCOUNTER — Encounter: Payer: Self-pay | Admitting: Oncology

## 2022-12-03 ENCOUNTER — Telehealth: Payer: Self-pay | Admitting: Emergency Medicine

## 2022-12-03 ENCOUNTER — Telehealth: Payer: Self-pay

## 2022-12-03 ENCOUNTER — Ambulatory Visit
Admission: EM | Admit: 2022-12-03 | Discharge: 2022-12-03 | Disposition: A | Discharge status: Home / Self Care | Payer: Medicare Other | Attending: Emergency Medicine | Admitting: Emergency Medicine

## 2022-12-03 ENCOUNTER — Ambulatory Visit (INDEPENDENT_AMBULATORY_CARE_PROVIDER_SITE_OTHER): Payer: Medicare Other

## 2022-12-03 DIAGNOSIS — J111 Influenza due to unidentified influenza virus with other respiratory manifestations: Secondary | ICD-10-CM

## 2022-12-03 DIAGNOSIS — J101 Influenza due to other identified influenza virus with other respiratory manifestations: Secondary | ICD-10-CM | POA: Insufficient documentation

## 2022-12-03 DIAGNOSIS — Z20822 Contact with and (suspected) exposure to covid-19: Secondary | ICD-10-CM | POA: Diagnosis present

## 2022-12-03 DIAGNOSIS — Z20828 Contact with and (suspected) exposure to other viral communicable diseases: Secondary | ICD-10-CM | POA: Insufficient documentation

## 2022-12-03 LAB — RESP PANEL BY RT-PCR (RSV, FLU A&B, COVID)  RVPGX2
Influenza A by PCR: POSITIVE — AB
Influenza B by PCR: NEGATIVE
Resp Syncytial Virus by PCR: NEGATIVE
SARS Coronavirus 2 by RT PCR: NEGATIVE

## 2022-12-03 MED ORDER — BENZONATATE 100 MG PO CAPS: 100.0000 mg | ORAL_CAPSULE | Freq: Three times a day (TID) | ORAL | 0 refills | Status: DC

## 2022-12-03 MED ORDER — OSELTAMIVIR PHOSPHATE 30 MG PO CAPS: 30.0000 mg | ORAL_CAPSULE | Freq: Every day | ORAL | 0 refills | Status: AC

## 2022-12-03 NOTE — ED Triage Notes (Signed)
Pt c/o ongoing cough & fever highest being 102 & this AM around 10:30 being 100. Sx's ongoing x3 days. Pt denies any chills,bodyaches or HA. No known exposure. Otc tylenol last dose around 10:30

## 2022-12-03 NOTE — Telephone Encounter (Signed)
Pt is aware of Flu test results she will pick up prescription from the pharmacy.

## 2022-12-03 NOTE — ED Provider Notes (Signed)
HPI  SUBJECTIVE:  Lisa Hernandez is a 86 y.o. female who presents with 2 days of fevers Tmax 102, cough productive of white sputum. Body aches, headaches, nasal congestion, rhinorrhea, sore throat, wheezing, shortness of breath, nausea, vomiting, diarrhea, abdominal pain.  She was exposed to flu 3 days ago.  No known COVID or RSV exposure.  She got 5 doses of the COVID-vaccine, this years flu vaccine and the RSV vaccine.  No antibiotics in the past 3 months.  She has been taking Tylenol with improvement in her symptoms.  Her last dose was within 6 hours of evaluation.  No aggravating factors.  She has a past medical history of rheumatoid arthritis on hydroxychloroquine, hypertension, chronic kidney disease, diabetes.  No history of pulmonary disease, diabetes.  PCP: [facility].   Past Medical History:  Diagnosis Date   Back pain    CRD (chronic renal disease)    Diabetes mellitus without complication (HCC)    Dizziness    Dysphagia    Dyspnea    Esophageal stricture    a. s/p dilation x 7; b. 10/14/2017 EGD: benign appearing esophageal stenosis (severe) w/ adherent scar tissue in distal esophagus.   GERD (gastroesophageal reflux disease)    with esophagitis   History of bone density study    Hx of adenomatous colonic polyps    Hypertension    [place] grade C esophagitis    Osteoarthritis    knees   Rheumatoid arthritis (HCC)    Thyroid nodule    Wears dentures    full upper and lower    Past Surgical History:  Procedure Laterality Date   ABDOMINAL HYSTERECTOMY     adenomatous colon polyps  08/25/2016   CATARACT EXTRACTION W/PHACO Right 09/30/2021   Procedure: CATARACT EXTRACTION PHACO AND INTRAOCULAR LENS PLACEMENT ([place]) RIGHT DIABETIC;  Surgeon: Eulogio Bear, MD;  Location: [place];  Service: Ophthalmology;  Laterality: Right;  4.45 0:29.3   CATARACT EXTRACTION W/PHACO Left 10/14/2021   Procedure: CATARACT EXTRACTION PHACO AND INTRAOCULAR LENS  PLACEMENT ([place]) LEFT DIABETIC;  Surgeon: Eulogio Bear, MD;  Location: [place];  Service: Ophthalmology;  Laterality: Left;  Diabetic 4.97 00:34.0   CHOLECYSTECTOMY     COLONOSCOPY     ESOPHAGOGASTRODUODENOSCOPY N/A 01/14/2019   Procedure: ESOPHAGOGASTRODUODENOSCOPY (EGD);  Surgeon: Manya Silvas, MD;  Location: [facility] ENDOSCOPY;  Service: Endoscopy;  Laterality: N/A;   ESOPHAGOGASTRODUODENOSCOPY (EGD) WITH PROPOFOL N/A 10/03/2015   Procedure: ESOPHAGOGASTRODUODENOSCOPY (EGD) WITH PROPOFOL;  Surgeon: Manya Silvas, MD;  Location[facility] ENDOSCOPY;  Service: Endoscopy;  Laterality: N/A;   ESOPHAGOGASTRODUODENOSCOPY (EGD) WITH PROPOFOL N/A 10/25/2015   Procedure: ESOPHAGOGASTRODUODENOSCOPY (EGD) WITH PROPOFOL;  Surgeon: Manya Silvas, MD;  Location: [facility] ENDOSCOPY;  Service: Endoscopy;  Laterality: N/A;   IDDM   ESOPHAGOGASTRODUODENOSCOPY (EGD) WITH PROPOFOL N/A 10/27/2016   Procedure: ESOPHAGOGASTRODUODENOSCOPY (EGD) WITH PROPOFOL;  Surgeon: Manya Silvas, MD;  Location: [facility] ENDOSCOPY;  Service: Endoscopy;  Laterality: N/A;  Diabetic   ESOPHAGOGASTRODUODENOSCOPY (EGD) WITH PROPOFOL N/A 10/14/2017   Procedure: ESOPHAGOGASTRODUODENOSCOPY (EGD) WITH PROPOFOL;  Surgeon: Manya Silvas, MD;  Location: [facility] ENDOSCOPY;  Service: Endoscopy;  Laterality: N/A;   ESOPHAGOGASTRODUODENOSCOPY (EGD) WITH PROPOFOL N/A 11/13/2017   Procedure: ESOPHAGOGASTRODUODENOSCOPY (EGD) WITH PROPOFOL;  Surgeon: Manya Silvas, MD;  Location: [facility] ENDOSCOPY;  Service: Endoscopy;  Laterality: N/A;   ESOPHAGOGASTRODUODENOSCOPY (EGD) WITH PROPOFOL N/A 05/24/2018   Procedure: ESOPHAGOGASTRODUODENOSCOPY (EGD) WITH PROPOFOL;  Surgeon: Manya Silvas, MD;  Location: [facility] ENDOSCOPY;  Service: Endoscopy;  Laterality: N/A;   ESOPHAGOGASTRODUODENOSCOPY (EGD) WITH PROPOFOL N/A 08/29/2019   Procedure: ESOPHAGOGASTRODUODENOSCOPY (EGD) WITH PROPOFOL;  Surgeon: Toledo, Benay Pike, MD;  Location: [facility] ENDOSCOPY;   Service: Gastroenterology;  Laterality: N/A;   ESOPHAGOGASTRODUODENOSCOPY (EGD) WITH PROPOFOL N/A 02/06/2020   Procedure: ESOPHAGOGASTRODUODENOSCOPY (EGD) WITH PROPOFOL;  Surgeon: Toledo, Benay Pike, MD;  Location: [facility] ENDOSCOPY;  Service: Gastroenterology;  Laterality: N/A;   ESOPHAGOGASTRODUODENOSCOPY (EGD) WITH PROPOFOL N/A 03/22/2020   Procedure: ESOPHAGOGASTRODUODENOSCOPY (EGD) WITH PROPOFOL;  Surgeon: Toledo, Benay Pike, MD;  Location: [facility] ENDOSCOPY;  Service: Gastroenterology;  Laterality: N/A;   ESOPHAGOGASTRODUODENOSCOPY (EGD) WITH PROPOFOL N/A 03/06/2021   Procedure: ESOPHAGOGASTRODUODENOSCOPY (EGD) WITH PROPOFOL;  Surgeon: Toledo, Benay Pike, MD;  Location: [facility] ENDOSCOPY;  Service: Gastroenterology;  Laterality: N/A;   KYPHOPLASTY N/A 07/21/2019   Procedure: L1 KYPHOPLASTY;  Surgeon: Hessie Knows, MD;  Location: [facility] ORS;  Service: Orthopedics;  Laterality: N/A;   SAVORY DILATION N/A 10/03/2015   Procedure: SAVORY DILATION;  Surgeon: Manya Silvas, MD;  Location: [facility] ENDOSCOPY;  Service: Endoscopy;  Laterality: N/A;   SAVORY DILATION N/A 10/25/2015   Procedure: SAVORY DILATION;  Surgeon: Manya Silvas, MD;  Location: [facility] ENDOSCOPY;  Service: Endoscopy;  Laterality: N/A;   TONSILLECTOMY      Family History  Problem Relation Age of Onset   Diabetes Mother    Cancer Mother        died in her 13s   Diabetes Father    Heart attack Father        died @ 95   Diabetes Sister     Social History   Tobacco Use   Smoking status: Never   Smokeless tobacco: Never  Vaping Use   Vaping Use: Never used  Substance Use Topics   Alcohol use: No   Drug use: No    No current facility-administered medications for this encounter.  Current Outpatient Medications:    atenolol (TENORMIN) 25 MG tablet, Take 1 tablet (25 mg total) by mouth daily., Disp: 30 tablet, Rfl: 33   benzonatate (TESSALON) 100 MG capsule, Take 1 capsule (100 mg total) by mouth every 8 (eight) hours., Disp: 30  capsule, Rfl: 0   folic acid (FOLVITE) 1 MG tablet, Take 1 tablet (1 mg total) by mouth daily., Disp: 90 tablet, Rfl: 1   furosemide (LASIX) 20 MG tablet, Take 20 mg by mouth daily. , Disp: , Rfl:    gabapentin (NEURONTIN) 100 MG capsule, Take 100 mg by mouth 3 (three) times daily. , Disp: , Rfl:    hydroxychloroquine (PLAQUENIL) 200 MG tablet, Take 200 mg by mouth daily., Disp: , Rfl:    lisinopril (ZESTRIL) 20 MG tablet, Take 20 mg by mouth daily., Disp: , Rfl:    ONETOUCH DELICA LANCETS 56C MISC, 3 daily, Disp: , Rfl:    oseltamivir (TAMIFLU) 30 MG capsule, Take 1 capsule (30 mg total) by mouth daily for 5 days., Disp: 5 capsule, Rfl: 0   pantoprazole (PROTONIX) 40 MG tablet, Take 40 mg by mouth 2 (two) times daily., Disp: , Rfl:    potassium chloride (K-DUR) 10 MEQ tablet, TAKE 1 TABLET (10 MEQ TOTAL) BY MOUTH ONCE DAILY, Disp: , Rfl: 11   Vitamin D, Ergocalciferol, (DRISDOL) 1.25 MG (50000 UNIT) CAPS capsule, Take 50,000 Units by mouth every 7 (seven) days., Disp: , Rfl:    meclizine (ANTIVERT) 12.5 MG tablet, Take 12.5 mg by mouth 3 (three) times daily as needed for dizziness., Disp: , Rfl:   Allergies  Allergen Reactions  Erythromycin Ethylsuccinate [Erythromycin] Other (See Comments)    GI upset     ROS  As noted in HPI.   Physical Exam  BP 132/67 (BP Location: Left Arm)   Pulse 73   Temp 98.8 F (37.1 C) (Oral)   Resp 16   Ht '5\' 1"'$  (1.549 m)   Wt 59 kg   SpO2 100%   BMI 24.56 kg/m   Constitutional: Well developed, well nourished, no acute distress Eyes:  EOMI, conjunctiva normal bilaterally HENT: Normocephalic, atraumatic,mucus membranes moist.  No nasal congestion.  No maxillary, frontal sinus tenderness. Neck: No cervical lymphadenopathy Respiratory: Normal inspiratory effort lungs clear bilaterally, good air movement. Cardiovascular: Normal rate, regular rhythm, no murmurs rubs or gallops GI: nondistended skin: No rash, skin intact Musculoskeletal: no  deformities Neurologic: Alert & oriented x 3, no focal neuro deficits Psychiatric: Speech and behavior appropriate   ED Course   Medications - No data to display  Orders Placed This Encounter  Procedures   Resp panel by RT-PCR (RSV, Flu A&B, Covid) Anterior Nasal Swab    Standing Status:   Standing    Number of Occurrences:   1   DG Chest 2 View    Standing Status:   Standing    Number of Occurrences:   1    Order Specific Question:   Reason for Exam (SYMPTOM  OR DIAGNOSIS REQUIRED)    Answer:   cough fever r/o PNA    Results for orders placed or performed during the hospital encounter of 12/03/22 (from the past 24 hour(s))  Resp panel by RT-PCR (RSV, Flu A&B, Covid) Anterior Nasal Swab     Status: Abnormal   Collection Time: 12/03/22  1:21 PM   Specimen: Anterior Nasal Swab  Result Value Ref Range   SARS Coronavirus 2 by RT PCR NEGATIVE NEGATIVE   Influenza A by PCR POSITIVE (A) NEGATIVE   Influenza B by PCR NEGATIVE NEGATIVE   Resp Syncytial Virus by PCR NEGATIVE NEGATIVE   DG Chest 2 View  Result Date: 12/03/2022 CLINICAL DATA:  Cough and fever.  Evaluate for pneumonia. EXAM: CHEST - 2 VIEW COMPARISON:  PA chest and right rib radiographs 05/10/2022 FINDINGS: Cardiac silhouette and mediastinal contours are within normal limits. Mild-to-moderate calcification is again seen within the aortic arch. The lungs are clear. No pleural effusion or pneumothorax. Mild-to-moderate multilevel degenerative disc and endplate changes of the visualized thoracolumbar spine. There is again mild-to-moderate anterior L1 vertebral body height loss with vertebroplasty cement. Surgical clips overlie the right upper abdominal quadrant. IMPRESSION: 1. No active cardiopulmonary disease. 2. Unchanged mild-to-moderate anterior L1 vertebral body height loss with vertebroplasty cement. Electronically Signed   By: Yvonne Kendall M.D.   On: 12/03/2022 13:42    ED Clinical Impression  1. Influenza A   2.  Exposure to influenza   3. Encounter for laboratory testing for COVID-19 virus      ED Assessment/Plan     Checking COVID, flu, RSV, chest x-ray.  Outside labs reviewed.  Calculated creatinine clearance from outside labs done on October 29, 2022 22 mL/min  Reviewed imaging independently.  No pneumonia.  See radiology report for full details.  Patient with exposure to influenza A and flulike symptoms.  Chest x-ray negative for pneumonia. Sending home with renally dosed Tamiflu 30 mg once a day per up-to-date guidelines.  May take Tylenol 3-4 times a day as needed, will send home with Tessalon for cough.  Follow-up with PCP if not better in 2  to 3 days.  Strict ER return precautions given.  Influenza A positive.  Plan as above.  Will have staff contact patient and inform her of positive influenza A result and to have her start taking the Tamiflu today.  Discussed labs, imaging, MDM, treatment plan, and plan for follow-up with patient. Discussed sn/sx that should prompt return to the ED. patient agrees with plan.   Meds ordered this encounter  Medications   oseltamivir (TAMIFLU) 30 MG capsule    Sig: Take 1 capsule (30 mg total) by mouth daily for 5 days.    Dispense:  5 capsule    Refill:  0   benzonatate (TESSALON) 100 MG capsule    Sig: Take 1 capsule (100 mg total) by mouth every 8 (eight) hours.    Dispense:  30 capsule    Refill:  0      *This clinic note was created using [employer]. Therefore, there may be occasional mistakes despite careful proofreading.  ?    Melynda Ripple, MD 12/04/22 [phone]

## 2022-12-03 NOTE — Telephone Encounter (Addendum)
Called pt to inform her that pt tested + for influenza A & Dr.Mortenson would like pt to start Tamiflu x5 days. Which was sent to [facility] in [place]. Unable to reach.

## 2022-12-03 NOTE — Discharge Instructions (Addendum)
Your COVID, flu and RSV are still pending.  I will contact you only if any of them are positive.  I am however concerned that you have influenza given your exposure.  I am sending you home with a prescription of Tamiflu.  You may take Tylenol 3 and a day as needed for fever.  Make sure you drink plenty of electrolyte containing fluids.  Follow-up with your primary care provider in 3 to 5 days.  Go to the ER if you get worse, or for any concerns.

## 2023-01-28 ENCOUNTER — Encounter: Payer: Self-pay | Admitting: Oncology

## 2023-12-14 ENCOUNTER — Encounter: Payer: Self-pay | Admitting: Oncology

## 2024-04-20 ENCOUNTER — Ambulatory Visit (INDEPENDENT_AMBULATORY_CARE_PROVIDER_SITE_OTHER)

## 2024-04-20 ENCOUNTER — Ambulatory Visit
Admission: EM | Admit: 2024-04-20 | Discharge: 2024-04-20 | Disposition: A | Discharge status: Home / Self Care | Attending: Emergency Medicine | Admitting: Emergency Medicine

## 2024-04-20 ENCOUNTER — Encounter: Payer: Self-pay | Admitting: Oncology

## 2024-04-20 DIAGNOSIS — M79672 Pain in left foot: Secondary | ICD-10-CM

## 2024-04-20 DIAGNOSIS — M19072 Primary osteoarthritis, left ankle and foot: Secondary | ICD-10-CM | POA: Diagnosis not present

## 2024-04-20 MED ORDER — METHYLPREDNISOLONE 4 MG PO TBPK: ORAL_TABLET | ORAL | 0 refills | Status: AC

## 2024-04-20 MED ORDER — DEXAMETHASONE SODIUM PHOSPHATE 10 MG/ML IJ SOLN
10.0000 mg | Freq: Once | INTRAMUSCULAR | Status: AC
  Administered 2024-04-20: 10 mg via INTRAMUSCULAR

## 2024-04-20 NOTE — Discharge Instructions (Addendum)
 Did not show any evidence of broken bones though you do have some degenerative changes throughout your midfoot.  This may very well be an osteoarthritis flare that you are experiencing.  Starting tomorrow morning take the Medrol Dosepak according to the package instructions to help decrease inflammation and help in pain relief.  You may keep your left foot elevated is much as possible to help decrease swelling and aid in pain relief.  You may apply ice to your foot for 20 set of time, 2-3 times a day, as needed for pain or inflammation.  If your symptoms do not improve, new symptoms develop, or your symptoms worsen, please return for reevaluation or follow-up with your primary care provider.

## 2024-04-20 NOTE — ED Provider Notes (Addendum)
 [facility]    CSN: [id number] Arrival date & time: 04/20/24  1153      History   Chief Complaint Chief Complaint  Patient presents with   Foot Pain    HPI Lisa Hernandez is a 88 y.o. female.   HPI  89 year old female with past medical history significant for diabetes, osteoarthritis, rheumatoid arthritis, hypertension, GERD, chronic renal disease, and multiple blood dyscrasias presents for evaluation of 3 days worth of the left foot pain.  She reports that 6 days ago she was walking up steps and a pair of shoes and she felt pain in her arch.  The pain resolved after a day or so.  3 days ago she developed pain on the top of her foot as well as some redness.  She has not had any falls or other injuries.  No history of gout.  She denies any osteoarthritis or rheumatoid arthritis flare in her feet prior.  Past Medical History:  Diagnosis Date   Back pain    CRD (chronic renal disease)    Diabetes mellitus without complication (HCC)    Dizziness    Dysphagia    Dyspnea    Esophageal stricture    a. s/p dilation x 7; b. 10/14/2017 EGD: benign appearing esophageal stenosis (severe) w/ adherent scar tissue in distal esophagus.   GERD (gastroesophageal reflux disease)    with esophagitis   History of bone density study    Hx of adenomatous colonic polyps    Hypertension    [place] grade C esophagitis    Osteoarthritis    knees   Rheumatoid arthritis (HCC)    Thyroid  nodule    Wears dentures    full upper and lower    Patient Active Problem List   Diagnosis Date Noted   Pancytopenia (HCC)    B12 deficiency    Symptomatic anemia 10/23/2017   Neutropenia (HCC)    Thrombocytopenia (HCC)     Past Surgical History:  Procedure Laterality Date   ABDOMINAL HYSTERECTOMY     adenomatous colon polyps  08/25/2016   CATARACT EXTRACTION W/PHACO Right 09/30/2021   Procedure: CATARACT EXTRACTION PHACO AND INTRAOCULAR LENS PLACEMENT (IOC) RIGHT DIABETIC;  Surgeon: Rosa College, MD;  Location: [facility];  Service: Ophthalmology;  Laterality: Right;  4.45 0:29.3   CATARACT EXTRACTION W/PHACO Left 10/14/2021   Procedure: CATARACT EXTRACTION PHACO AND INTRAOCULAR LENS PLACEMENT (IOC) LEFT DIABETIC;  Surgeon: Rosa College, MD;  Location: [facility];  Service: Ophthalmology;  Laterality: Left;  Diabetic 4.97 00:34.0   CHOLECYSTECTOMY     COLONOSCOPY     ESOPHAGOGASTRODUODENOSCOPY N/A 01/14/2019   Procedure: ESOPHAGOGASTRODUODENOSCOPY (EGD);  Surgeon: Cassie Click, MD;  Location: [facility] ENDOSCOPY;  Service: Endoscopy;  Laterality: N/A;   ESOPHAGOGASTRODUODENOSCOPY (EGD) WITH PROPOFOL  N/A 10/03/2015   Procedure: ESOPHAGOGASTRODUODENOSCOPY (EGD) WITH PROPOFOL ;  Surgeon: Cassie Click, MD;  Location: [facility] END[facility]COPY;  Service: Endoscopy;  Laterality: N/A;   ESOPHAGOGASTRODUODENOSCOPY (EGD) WITH PROPOFOL  N/A 10/25/2015   Procedure: ESOPHAGOGASTRODUODENOSCOPY (EGD) WITH PROPOFOL ;  Surgeon: Cassie Click, MD;  Location: [facility];  Service: Endoscopy;  Laterality: N/A;   IDDM   ESOPHAGOGASTRODUODENOSCOPY (EGD) WITH PROPOFOL  N/A 10/27/2016   Procedure: ESOPHAGOGASTRODUODENOSCOPY (EGD) WITH PROPOFOL ;  Surgeon: Cassie Click, MD;  Location: [facility] ENDOSCOPY;  Service: Endoscopy;  Laterality: N/A;  Diabetic   ESOPHAGOGASTRODUODENOSCOPY (EGD) WITH PROPOFOL  N/A 10/14/2017   Procedure: ESOPHAGOGASTRODUODENOSCOPY (EGD) WITH PROPOFOL ;  Surgeon: Cassie Click, MD;  Location: [facility] END[facility]COPY;  Service:  Endoscopy;  Laterality: N/A;   ESOPHAGOGASTRODUODENOSCOPY (EGD) WITH PROPOFOL  N/A 11/13/2017   Procedure: ESOPHAGOGASTRODUODENOSCOPY (EGD) WITH PROPOFOL ;  Surgeon: Cassie Click, MD;  Location: [facility] ENDOSCOPY;  Service: Endoscopy;  Laterality: N/A;   ESOPHAGOGASTRODUODENOSCOPY (EGD) WITH PROPOFOL  N/A 05/24/2018   Procedure: ESOPHAGOGASTRODUODENOSCOPY (EGD) WITH PROPOFOL ;  Surgeon: Cassie Click, MD;  Location: [facility] ENDOSCOPY;   Service: Endoscopy;  Laterality: N/A;   ESOPHAGOGASTRODUODENOSCOPY (EGD) WITH PROPOFOL  N/A 08/29/2019   Procedure: ESOPHAGOGASTRODUODENOSCOPY (EGD) WITH PROPOFOL ;  Surgeon: Toledo, Alphonsus Jeans, MD;  Location: [facility] ENDOSCOPY;  Service: Gastroenterology;  Laterality: N/A;   ESOPHAGOGASTRODUODENOSCOPY (EGD) WITH PROPOFOL  N/A 02/06/2020   Procedure: ESOPHAGOGASTRODUODENOSCOPY (EGD) WITH PROPOFOL ;  Surgeon: Toledo, Alphonsus Jeans, MD;  Location: [facility] ENDOSCOPY;  Service: Gastroenterology;  Laterality: N/A;   ESOPHAGOGASTRODUODENOSCOPY (EGD) WITH PROPOFOL  N/A 03/22/2020   Procedure: ESOPHAGOGASTRODUODENOSCOPY (EGD) WITH PROPOFOL ;  Surgeon: Toledo, Alphonsus Jeans, MD;  Location: [facility] ENDOSCOPY;  Service: Gastroenterology;  Laterality: N/A;   ESOPHAGOGASTRODUODENOSCOPY (EGD) WITH PROPOFOL  N/A 03/06/2021   Procedure: ESOPHAGOGASTRODUODENOSCOPY (EGD) WITH PROPOFOL ;  Surgeon: Toledo, Alphonsus Jeans, MD;  Location: [facility] ENDOSCOPY;  Service: Gastroenterology;  Laterality: N/A;   KYPHOPLASTY N/A 07/21/2019   Procedure: L1 KYPHOPLASTY;  Surgeon: Molli Angelucci, MD;  Location: [facility] ORS;  Service: Orthopedics;  Laterality: N/A;   SAVORY DILATION N/A 10/03/2015   Procedure: SAVORY DILATION;  Surgeon: Cassie Click, MD;  Location: [facility] ENDOSCOPY;  Service: Endoscopy;  Laterality: N/A;   SAVORY DILATION N/A 10/25/2015   Procedure: SAVORY DILATION;  Surgeon: Cassie Click, MD;  Location: [facility] ENDOSCOPY;  Service: Endoscopy;  Laterality: N/A;   TONSILLECTOMY      OB History   No obstetric history on file.      Home Medications    Prior to Admission medications   Medication Sig Start Date End Date Taking? Authorizing Provider  atenolol  (TENORMIN ) 25 MG tablet Take 1 tablet (25 mg total) by mouth daily. 07/21/19  Yes Molli Angelucci, MD  folic acid  (FOLVITE ) 1 MG tablet Take 1 tablet (1 mg total) by mouth daily. 09/05/19  Yes Timmy Forbes, MD  furosemide  (LASIX ) 20 MG tablet Take 20 mg by mouth daily.  05/03/18  Yes [provider]  gabapentin  (NEURONTIN ) 100 MG capsule Take 100 mg by mouth 3 (three) times daily.    Yes [provider]  hydroxychloroquine (PLAQUENIL) 200 MG tablet Take 200 mg by mouth daily.   Yes [provider]  lisinopril  (ZESTRIL ) 20 MG tablet Take 20 mg by mouth daily. 05/13/19  Yes [provider]  meclizine  (ANTIVERT ) 12.5 MG tablet Take 12.5 mg by mouth 3 (three) times daily as needed for dizziness.   Yes [provider]  methylPREDNISolone (MEDROL DOSEPAK) 4 MG TBPK tablet Take according to the package insert. 04/20/24  Yes Kent Pear, NP  [facility] DELICA LANCETS 33G MISC 3 daily   Yes [provider]  pantoprazole (PROTONIX) 40 MG tablet Take 40 mg by mouth 2 (two) times daily. 06/06/19  Yes [provider]  potassium chloride  (K-DUR) 10 MEQ tablet TAKE 1 TABLET (10 MEQ TOTAL) BY MOUTH ONCE DAILY 05/03/18  Yes [provider]  Vitamin D , Ergocalciferol , (DRISDOL) 1.25 MG (50000 UNIT) CAPS capsule Take 50,000 Units by mouth every 7 (seven) days.   Yes [provider]    Family History Family History  Problem Relation Hernandez of Onset   Diabetes Mother    Cancer Mother        died in her 50s   Diabetes Father  Heart attack Father        died @ 9   Diabetes Sister     Social History Social History   Tobacco Use   Smoking status: Never   Smokeless tobacco: Never  Vaping Use   Vaping status: Never Used  Substance Use Topics   Alcohol use: No   Drug use: No     Allergies   Erythromycin   Review of Systems Review of Systems  Musculoskeletal:  Positive for arthralgias. Negative for joint swelling.  Skin:  Positive for color change.     Physical Exam Triage Vital Signs ED Triage Vitals [04/20/24 1225]  Encounter Vitals Group     BP      Systolic BP Percentile      Diastolic BP Percentile      Pulse      Resp 16     Temp      Temp Source Oral     SpO2      Weight      Height      Head  Circumference      Peak Flow      Pain Score      Pain Loc      Pain Education      Exclude from Growth Chart    No data found.  Updated Vital Signs BP (!) 154/60 (BP Location: Left Arm)   Pulse 63   Temp 99.2 F (37.3 C) (Oral)   Resp 16   Ht 5\' 1"  (1.549 m)   Wt 135 lb (61.2 kg)   SpO2 99%   BMI 25.51 kg/m   Visual Acuity Right Eye Distance:   Left Eye Distance:   Bilateral Distance:    Right Eye Near:   Left Eye Near:    Bilateral Near:     Physical Exam Vitals and nursing note reviewed.  Constitutional:      Appearance: Normal appearance.  Musculoskeletal:        General: Tenderness present. No swelling, deformity or signs of injury. Normal range of motion.  Skin:    General: Skin is warm and dry.     Capillary Refill: Capillary refill takes less than 2 seconds.     Findings: Erythema present.  Neurological:     General: No focal deficit present.     Mental Status: She is alert and oriented to person, place, and time.     Sensory: No sensory deficit.      UC Treatments / Results  Labs (all labs ordered are listed, but only abnormal results are displayed) Labs Reviewed - No data to display  EKG   Radiology No results found.  Procedures Procedures (including critical care time)  Medications Ordered in UC Medications  dexamethasone  (DECADRON ) injection 10 mg (has no administration in time range)    Initial Impression / Assessment and Plan / UC Course  I have reviewed the triage vital signs and the nursing notes.  Pertinent labs & imaging results that were available during my care of the patient were reviewed by me and considered in my medical decision making (see chart for details).   Patient is a pleasant, nontoxic-appearing 88 year old female presenting for evaluation of pain in her left foot as outlined in HPI above.  As you can see in the image above, the patient has erythema over the proximal, medial, midfoot overlying the medial and  intermediate cuneiform bones.  This area is also tender to palpation and mildly warm.  Patient has full  sensation and range of motion of her toes as well as range of motion of her foot and ankle.  No pain with palpation of the arch.  I will obtain a radiograph of the left foot to rule out any bony abnormality such as possible stress fracture or arthropathy.  Left foot x-rays independently reviewed and evaluated by me.  Impression: Degenerative changes throughout the midfoot without evidence of fracture or dislocation.  Plantar heel spur present.  Soft tissues are unremarkable. Radiology impression states no acute osseous abnormality.  I will discharge patient home with a diagnosis of left foot arthropathy on a Medrol Dosepak.  I will have staff administer 10 mg of Decadron  here in clinic prior to discharge and she can start the Medrol Dosepak in the morning.  If your symptoms not improved she can return for reevaluation or follow-up with her primary care provider.   Final Clinical Impressions(s) / UC Diagnoses   Final diagnoses:  Left foot pain  Arthropathy of left foot     Discharge Instructions      Did not show any evidence of broken bones though you do have some degenerative changes throughout your midfoot.  This may very well be an osteoarthritis flare that you are experiencing.  Starting tomorrow morning take the Medrol Dosepak according to the package instructions to help decrease inflammation and help in pain relief.  You may keep your left foot elevated is much as possible to help decrease swelling and aid in pain relief.  You may apply ice to your foot for 20 set of time, 2-3 times a day, as needed for pain or inflammation.  If your symptoms do not improve, new symptoms develop, or your symptoms worsen, please return for reevaluation or follow-up with your primary care provider.     ED Prescriptions     Medication Sig Dispense Auth. Provider   methylPREDNISolone (MEDROL  DOSEPAK) 4 MG TBPK tablet Take according to the package insert. 1 each Kent Pear, NP      PDMP not reviewed this encounter.   Kent Pear, NP 04/20/24 1324    Kent Pear, NP 04/20/24 1407

## 2024-04-20 NOTE — ED Triage Notes (Signed)
 Pt c/o L foot pain x3 days. Denies any falls or injuries.

## 2024-06-15 ENCOUNTER — Other Ambulatory Visit
Admission: RE | Admit: 2024-06-15 | Discharge: 2024-06-15 | Disposition: A | Discharge status: Home / Self Care | Source: Ambulatory Visit | Attending: Ophthalmology | Admitting: Ophthalmology

## 2024-06-15 ENCOUNTER — Encounter: Payer: Self-pay | Admitting: Oncology

## 2024-06-15 DIAGNOSIS — M316 Other giant cell arteritis: Secondary | ICD-10-CM | POA: Insufficient documentation

## 2024-06-15 DIAGNOSIS — R519 Headache, unspecified: Secondary | ICD-10-CM | POA: Diagnosis present

## 2024-06-15 LAB — C-REACTIVE PROTEIN: CRP: 1.1 mg/dL — ABNORMAL HIGH (ref <1.0)

## 2024-06-15 LAB — PLATELET COUNT: Platelets: 245 10*3/uL (ref 150–400)

## 2024-06-15 LAB — SEDIMENTATION RATE: Sed Rate: 28 mm/h (ref 0–30)

## 2024-06-27 ENCOUNTER — Other Ambulatory Visit: Payer: Self-pay | Admitting: Internal Medicine

## 2024-06-27 DIAGNOSIS — R519 Headache, unspecified: Secondary | ICD-10-CM

## 2024-07-05 ENCOUNTER — Ambulatory Visit
Admission: RE | Admit: 2024-07-05 | Discharge: 2024-07-05 | Disposition: A | Discharge status: Home / Self Care | Source: Ambulatory Visit | Attending: Internal Medicine | Admitting: Internal Medicine

## 2024-07-05 DIAGNOSIS — R519 Headache, unspecified: Secondary | ICD-10-CM | POA: Diagnosis present
